# Patient Record
Sex: Male | Born: 1980 | Hispanic: Yes | Smoking: Former smoker
Health system: Southern US, Community
[De-identification: ages and names within clinical notes are randomized; demographics above are authoritative.]

## PROBLEM LIST (undated history)

## (undated) ENCOUNTER — Inpatient Hospital Stay: Payer: Self-pay

## (undated) DIAGNOSIS — M797 Fibromyalgia: Secondary | ICD-10-CM

## (undated) DIAGNOSIS — U099 Post covid-19 condition, unspecified: Secondary | ICD-10-CM

## (undated) DIAGNOSIS — G0490 Encephalitis and encephalomyelitis, unspecified: Secondary | ICD-10-CM

## (undated) DIAGNOSIS — H9120 Sudden idiopathic hearing loss, unspecified ear: Secondary | ICD-10-CM

## (undated) DIAGNOSIS — F32A Depression, unspecified: Secondary | ICD-10-CM

## (undated) DIAGNOSIS — G379 Demyelinating disease of central nervous system, unspecified: Secondary | ICD-10-CM

## (undated) DIAGNOSIS — K5900 Constipation, unspecified: Secondary | ICD-10-CM

## (undated) DIAGNOSIS — J45909 Unspecified asthma, uncomplicated: Secondary | ICD-10-CM

## (undated) DIAGNOSIS — N6019 Diffuse cystic mastopathy of unspecified breast: Secondary | ICD-10-CM

## (undated) DIAGNOSIS — N63 Unspecified lump in unspecified breast: Secondary | ICD-10-CM

## (undated) DIAGNOSIS — G35 Multiple sclerosis: Secondary | ICD-10-CM

## (undated) DIAGNOSIS — G9332 Myalgic encephalomyelitis/chronic fatigue syndrome: Secondary | ICD-10-CM

## (undated) DIAGNOSIS — F329 Major depressive disorder, single episode, unspecified: Secondary | ICD-10-CM

## (undated) DIAGNOSIS — G43909 Migraine, unspecified, not intractable, without status migrainosus: Secondary | ICD-10-CM

## (undated) DIAGNOSIS — N6009 Solitary cyst of unspecified breast: Secondary | ICD-10-CM

## (undated) HISTORY — PX: BUNIONECTOMY: SHX129

## (undated) HISTORY — PX: HERNIA REPAIR: SHX51

## (undated) HISTORY — PX: TOTAL MASTECTOMY: SHX6129

## (undated) HISTORY — DX: Sudden idiopathic hearing loss, unspecified ear: H91.20

## (undated) HISTORY — PX: EYE SURGERY: SHX253

## (undated) HISTORY — DX: Constipation, unspecified: K59.00

---

## 2008-11-18 ENCOUNTER — Emergency Department (HOSPITAL_COMMUNITY): Admission: EM | Admit: 2008-11-18 | Discharge: 2008-11-18 | Payer: Self-pay | Admitting: Emergency Medicine

## 2009-10-20 ENCOUNTER — Emergency Department (HOSPITAL_COMMUNITY): Admission: EM | Admit: 2009-10-20 | Discharge: 2009-10-21 | Payer: Self-pay | Admitting: Emergency Medicine

## 2009-11-30 ENCOUNTER — Encounter: Payer: Self-pay | Admitting: Family Medicine

## 2010-01-02 ENCOUNTER — Ambulatory Visit: Payer: Self-pay | Admitting: Sports Medicine

## 2010-01-02 DIAGNOSIS — M7650 Patellar tendinitis, unspecified knee: Secondary | ICD-10-CM | POA: Insufficient documentation

## 2010-01-02 DIAGNOSIS — M25569 Pain in unspecified knee: Secondary | ICD-10-CM | POA: Insufficient documentation

## 2010-04-10 NOTE — Consult Note (Signed)
Summary: [facility]  [facility]   Imported By: Marily Memos 12/04/2009 13:34:51  _____________________________________________________________________  External Attachment:    Type:   Image     Comment:   External Document

## 2010-04-10 NOTE — Assessment & Plan Note (Signed)
Summary: ORIG 2PM-PAIN KNEES/NP/LP   Vital Signs:  Patient profile:   30 year old male Pulse rate:   96 / minute BP sitting:   134 / 83  (right arm)  Vitals Entered By: Rochele Pages RN (January 02, 2010 1:43 PM) CC: bilat knee pain x 2-3 yrs   History of Present Illness: Dr. Nedra Hai from the [facility] sports medicine clinic is requested a consult  for evaluation of bilateral  knee pain, and requested diagnostic ultrasound evaluation.  The patient has had bilateral knee pain, RIGHT greater than LEFT, ongoing for approximately  2-3 years.  She also has various other intermittent pains in his has been told in the past she does have fibromyalgia syndrome. At this point, she primarily has  pain when rising  from a seated position,, going up and down stairs, and with a  deceleration motion going downwards.  Plain x-rays were obtained at [facility], and these are reportedly normal. Not available for my review.  The patient has been seen by multiple physicians by this in the past. She was told in past that she may have patellar malalignment, and what sounds to be some patellofemoral symptoms. Never has been found to have any internal arrangement. She has never had any history of intra-articular operative intervention.  No prior fractures and around the joints.  right now she is attempting to do aquatic rehabilitation.  Past History:  Past Medical History: Depression Borderline Personality d/o ? Fibromyalgia  Social History: Equities trader, school system, sign  Review of Systems       REVIEW OF SYSTEMS REVIEW OF SYSTEMS  GEN: No systemic complaints, no fevers, chills, sweats, or other acute illnesses MSK: Detailed in the HPI GI: tolerating PO intake without difficulty Neuro: No numbness, parasthesias, or tingling associated. Otherwise the pertinent positives of the ROS are noted above.    Physical Exam  General:  GEN: Well-developed,well-nourished,in no acute  distress; alert,appropriate and cooperative throughout examination HEENT: Normocephalic and atraumatic without obvious abnormalities. No apparent alopecia or balding. Ears, externally no deformities PULM: Breathing comfortably in no respiratory distress EXT: No clubbing, cyanosis, or edema PSYCH: Normally interactive. Cooperative during the interview. Pleasant. Friendly and conversant. Not anxious or depressed appearing. Normal, full affect.  Msk:  bilaterally his: Full range of motion. Full extension. Flexion to be on 125.  No tenderness to palpation with medial and lateral patellar facet loading.  Patellar tendon is notably tender on the RIGHT compared to the LEFT.  Quadriceps tendon superior to  the patella  is quite tender bilaterally.  No effusion.  Stable MCL, LCL. Negative Lachman. Negative anterior and posterior drawer examinations.  Negative McMurray's, negative flexion pinch test. Negative bounce home test.  Nontender along the joint lines, and nontender at the pes anserinus area.  Strength testing is 5/5.  neurovascularly intact. Additional Exam:   Diagnostic Ultrasound Evaluation [employer] E, MSK ultrasound, MSK probe Anatomy scanned: bilateral knees Indication: Pain Findings: there is some slight degree of greater color-flow Doppler activity around the patellar tendons, RIGHT greater than LEFT. Additionally so  on the quadriceps tendons.  Bilateral joint lines appear clear with no opacities in the joint line or osteophyte significant formation. Patellar and quadriceps tendons appear intact. No significant effusions. MCL and LCL bilaterally appear intact as well.   Impression & Recommendations:  Problem # 1:  KNEE PAIN, BILATERAL (ICD-719.46) bilateral quadriceps tendinitis with RIGHT greater than LEFT patellar tendinitis.  Recommendations include: Weight loss at all costs.  Reviewed rehabilitation protocol emphasizing eccentric quadricep strengthening  and lowers, particularly on a  inclined  if at all possible. Continued water aerobics. We will do a trial of nitroglycerin patches,, using a lower dose  given multiple  tendinopathy is present.  Initially start at the RIGHT patellar tendon, then progressed to both quadriceps tendons, adding over  2 weeks.  Additionally, we added but he looks  patellar tendon straps bilaterally  to be used when the patient is active.  cc: Dr. Nedra Hai  Orders: Garment,belt,sleeve or other covering ,elastic or similar stretch (Z6109)  Problem # 2:  TENDINITIS, PATELLAR (ICD-726.64)  Complete Medication List: 1)  Nitroglycerin 0.1 Mg/hr Pt24 (Nitroglycerin) .... 1/4 patch to right patellar and bilateral quadriceps tendons, change daily  Patient Instructions: 1)  f/u 6 weeks 2)  ICE YOUR KNEES WHEN SORE 3)  DO THE REHAB WE REVIEWED Prescriptions: NITROGLYCERIN 0.1 MG/HR PT24 (NITROGLYCERIN) 1/4 patch to right patellar and bilateral quadriceps tendons, change daily  #24 x 3   Entered and Authorized by:   Hannah Beat MD   Signed by:   Hannah Beat MD on 01/02/2010   Method used:   Print then Give to Patient   RxID:   [id number]    Orders Added: 1)  Garment,belt,sleeve or other covering ,elastic or similar stretch [A4466] 2)  Consultation Level III [95621]

## 2010-05-25 LAB — GLUCOSE, CAPILLARY: Glucose-Capillary: 101 mg/dL — ABNORMAL HIGH (ref 70–99)

## 2010-05-25 LAB — URINE MICROSCOPIC-ADD ON

## 2010-05-25 LAB — URINALYSIS, ROUTINE W REFLEX MICROSCOPIC
Bilirubin Urine: NEGATIVE
Glucose, UA: NEGATIVE mg/dL
Specific Gravity, Urine: 1.024 (ref 1.005–1.030)
pH: 5.5 (ref 5.0–8.0)

## 2010-05-25 LAB — POCT PREGNANCY, URINE: Preg Test, Ur: NEGATIVE

## 2010-06-15 LAB — BASIC METABOLIC PANEL
BUN: 4 mg/dL — ABNORMAL LOW (ref 6–23)
Calcium: 9 mg/dL (ref 8.4–10.5)
Creatinine, Ser: 0.93 mg/dL (ref 0.4–1.2)
GFR calc non Af Amer: >60 mL/min (ref >60)
Glucose, Bld: 104 mg/dL — ABNORMAL HIGH (ref 70–99)
Potassium: 3.5 mEq/L (ref 3.5–5.1)

## 2010-06-15 LAB — DIFFERENTIAL
Basophils Absolute: 0 10*3/uL (ref 0.0–0.1)
Lymphs Abs: 0.9 10*3/uL (ref 0.7–4.0)
Monocytes Relative: 26 % — ABNORMAL HIGH (ref 3–12)
Neutrophils Relative %: 52 % (ref 43–77)

## 2010-06-15 LAB — POCT CARDIAC MARKERS
CKMB, poc: <1 ng/mL — ABNORMAL LOW (ref 1.0–8.0)
Myoglobin, poc: 40.3 ng/mL (ref 12–200)
Myoglobin, poc: 57.9 ng/mL (ref 12–200)

## 2010-06-15 LAB — CBC
HCT: 38.8 % (ref 36.0–46.0)
Platelets: 247 10*3/uL (ref 150–400)
RDW: 12.9 % (ref 11.5–15.5)
WBC: 4.3 10*3/uL (ref 4.0–10.5)

## 2010-06-15 LAB — PREGNANCY, URINE: Preg Test, Ur: NEGATIVE

## 2010-06-15 LAB — D-DIMER, QUANTITATIVE: D-Dimer, Quant: 0.48 ug/mL-FEU (ref 0.00–0.48)

## 2010-07-31 ENCOUNTER — Emergency Department (HOSPITAL_COMMUNITY)
Admission: EM | Admit: 2010-07-31 | Discharge: 2010-07-31 | Disposition: A | Discharge status: Home / Self Care | Payer: Self-pay | Attending: Emergency Medicine | Admitting: Emergency Medicine

## 2010-07-31 DIAGNOSIS — J45909 Unspecified asthma, uncomplicated: Secondary | ICD-10-CM | POA: Insufficient documentation

## 2010-07-31 DIAGNOSIS — R6889 Other general symptoms and signs: Secondary | ICD-10-CM | POA: Insufficient documentation

## 2010-07-31 DIAGNOSIS — R0602 Shortness of breath: Secondary | ICD-10-CM | POA: Insufficient documentation

## 2010-07-31 DIAGNOSIS — F329 Major depressive disorder, single episode, unspecified: Secondary | ICD-10-CM | POA: Insufficient documentation

## 2010-07-31 DIAGNOSIS — F411 Generalized anxiety disorder: Secondary | ICD-10-CM | POA: Insufficient documentation

## 2010-07-31 DIAGNOSIS — T63461A Toxic effect of venom of wasps, accidental (unintentional), initial encounter: Secondary | ICD-10-CM | POA: Insufficient documentation

## 2010-07-31 DIAGNOSIS — F3289 Other specified depressive episodes: Secondary | ICD-10-CM | POA: Insufficient documentation

## 2010-07-31 DIAGNOSIS — F988 Other specified behavioral and emotional disorders with onset usually occurring in childhood and adolescence: Secondary | ICD-10-CM | POA: Insufficient documentation

## 2010-07-31 DIAGNOSIS — T6391XA Toxic effect of contact with unspecified venomous animal, accidental (unintentional), initial encounter: Secondary | ICD-10-CM | POA: Insufficient documentation

## 2010-10-10 ENCOUNTER — Emergency Department (HOSPITAL_COMMUNITY): Payer: BC Managed Care – PPO

## 2010-10-10 ENCOUNTER — Emergency Department (HOSPITAL_COMMUNITY)
Admission: EM | Admit: 2010-10-10 | Discharge: 2010-10-11 | Disposition: A | Discharge status: Home / Self Care | Payer: BC Managed Care – PPO | Attending: Emergency Medicine | Admitting: Emergency Medicine

## 2010-10-10 DIAGNOSIS — M79609 Pain in unspecified limb: Secondary | ICD-10-CM | POA: Insufficient documentation

## 2010-10-10 DIAGNOSIS — M21619 Bunion of unspecified foot: Secondary | ICD-10-CM | POA: Insufficient documentation

## 2010-12-14 ENCOUNTER — Ambulatory Visit: Payer: Self-pay | Admitting: Podiatry

## 2010-12-21 ENCOUNTER — Ambulatory Visit: Payer: Self-pay | Admitting: Podiatry

## 2011-01-16 ENCOUNTER — Emergency Department: Payer: Self-pay

## 2011-05-14 ENCOUNTER — Ambulatory Visit: Payer: Self-pay | Admitting: Internal Medicine

## 2011-09-17 ENCOUNTER — Emergency Department: Payer: Self-pay | Admitting: *Deleted

## 2011-09-22 ENCOUNTER — Emergency Department: Payer: Self-pay | Admitting: *Deleted

## 2012-03-07 ENCOUNTER — Emergency Department: Payer: Self-pay | Admitting: Emergency Medicine

## 2012-03-24 ENCOUNTER — Emergency Department: Payer: Self-pay | Admitting: Emergency Medicine

## 2013-03-11 HISTORY — PX: LAPAROSCOPIC GASTRIC SLEEVE RESECTION: SHX5895

## 2013-03-15 ENCOUNTER — Ambulatory Visit: Payer: Self-pay | Admitting: Podiatry

## 2013-03-17 ENCOUNTER — Ambulatory Visit: Payer: Self-pay | Admitting: Podiatry

## 2013-04-13 ENCOUNTER — Emergency Department: Payer: Self-pay | Admitting: Emergency Medicine

## 2013-05-04 ENCOUNTER — Emergency Department: Payer: Self-pay | Admitting: Emergency Medicine

## 2013-05-04 LAB — CBC WITH DIFFERENTIAL/PLATELET
BASOS PCT: 0.4 %
Basophil #: 0 10*3/uL (ref 0.0–0.1)
EOS PCT: 0.1 %
Eosinophil #: 0 10*3/uL (ref 0.0–0.7)
HCT: 45.3 % (ref 35.0–47.0)
HGB: 14.9 g/dL (ref 12.0–16.0)
Lymphocyte #: 0.3 10*3/uL — ABNORMAL LOW (ref 1.0–3.6)
Lymphocyte %: 3.3 %
MCH: 27.7 pg (ref 26.0–34.0)
MCHC: 32.8 g/dL (ref 32.0–36.0)
MCV: 84 fL (ref 80–100)
MONO ABS: 0.3 x10 3/mm (ref 0.2–0.9)
MONOS PCT: 3.5 %
NEUTROS ABS: 8.5 10*3/uL — AB (ref 1.4–6.5)
Neutrophil %: 92.7 %
Platelet: 328 10*3/uL (ref 150–440)
RBC: 5.37 10*6/uL — AB (ref 3.80–5.20)
RDW: 13.6 % (ref 11.5–14.5)
WBC: 9.1 10*3/uL (ref 3.6–11.0)

## 2013-05-04 LAB — COMPREHENSIVE METABOLIC PANEL
Albumin: 3.8 g/dL (ref 3.4–5.0)
Alkaline Phosphatase: 126 U/L — ABNORMAL HIGH
Anion Gap: 5 — ABNORMAL LOW (ref 7–16)
BUN: 8 mg/dL (ref 7–18)
Bilirubin,Total: 0.5 mg/dL (ref 0.2–1.0)
CO2: 27 mmol/L (ref 21–32)
Calcium, Total: 8.9 mg/dL (ref 8.5–10.1)
Chloride: 103 mmol/L (ref 98–107)
Creatinine: 0.87 mg/dL (ref 0.60–1.30)
EGFR (African American): >60
Glucose: 113 mg/dL — ABNORMAL HIGH (ref 65–99)
Osmolality: 269 (ref 275–301)
POTASSIUM: 3.8 mmol/L (ref 3.5–5.1)
SGOT(AST): 20 U/L (ref 15–37)
SGPT (ALT): 22 U/L (ref 12–78)
Sodium: 135 mmol/L — ABNORMAL LOW (ref 136–145)
TOTAL PROTEIN: 8.6 g/dL — AB (ref 6.4–8.2)

## 2013-05-04 LAB — URINALYSIS, COMPLETE
BACTERIA: NONE SEEN
Bilirubin,UR: NEGATIVE
Glucose,UR: NEGATIVE mg/dL (ref 0–75)
LEUKOCYTE ESTERASE: NEGATIVE
NITRITE: NEGATIVE
PH: 6 (ref 4.5–8.0)
Protein: NEGATIVE
SPECIFIC GRAVITY: 1.021 (ref 1.003–1.030)
Squamous Epithelial: 12
WBC UR: 2 /HPF (ref 0–5)

## 2013-05-04 LAB — TSH: THYROID STIMULATING HORM: 0.581 u[IU]/mL

## 2013-05-04 LAB — T4, FREE: Free Thyroxine: 1.07 ng/dL (ref 0.76–1.46)

## 2013-07-26 DIAGNOSIS — R51 Headache: Secondary | ICD-10-CM

## 2013-07-26 DIAGNOSIS — R55 Syncope and collapse: Secondary | ICD-10-CM | POA: Insufficient documentation

## 2013-07-26 DIAGNOSIS — R519 Headache, unspecified: Secondary | ICD-10-CM | POA: Insufficient documentation

## 2013-08-06 ENCOUNTER — Ambulatory Visit: Payer: Self-pay | Admitting: Physician Assistant

## 2013-10-06 ENCOUNTER — Ambulatory Visit: Payer: Self-pay | Admitting: Specialist

## 2014-01-08 ENCOUNTER — Emergency Department: Payer: Self-pay | Admitting: Emergency Medicine

## 2014-01-08 LAB — CBC
HCT: 43.3 % (ref 35.0–47.0)
HGB: 14.3 g/dL (ref 12.0–16.0)
MCH: 28.5 pg (ref 26.0–34.0)
MCHC: 32.9 g/dL (ref 32.0–36.0)
MCV: 87 fL (ref 80–100)
PLATELETS: 334 10*3/uL (ref 150–440)
RBC: 5 10*6/uL (ref 3.80–5.20)
RDW: 13.5 % (ref 11.5–14.5)
WBC: 3.9 10*3/uL (ref 3.6–11.0)

## 2014-01-08 LAB — COMPREHENSIVE METABOLIC PANEL
ALBUMIN: 3.5 g/dL (ref 3.4–5.0)
ALT: 20 U/L
Alkaline Phosphatase: 102 U/L
Anion Gap: 6 — ABNORMAL LOW (ref 7–16)
BUN: 7 mg/dL (ref 7–18)
Bilirubin,Total: 0.3 mg/dL (ref 0.2–1.0)
CALCIUM: 8.7 mg/dL (ref 8.5–10.1)
CHLORIDE: 104 mmol/L (ref 98–107)
Co2: 30 mmol/L (ref 21–32)
Creatinine: 0.85 mg/dL (ref 0.60–1.30)
EGFR (Non-African Amer.): >60
GLUCOSE: 93 mg/dL (ref 65–99)
Osmolality: 277 (ref 275–301)
POTASSIUM: 4.2 mmol/L (ref 3.5–5.1)
SGOT(AST): 16 U/L (ref 15–37)
SODIUM: 140 mmol/L (ref 136–145)
TOTAL PROTEIN: 8 g/dL (ref 6.4–8.2)

## 2014-01-08 LAB — URINALYSIS, COMPLETE
BACTERIA: NONE SEEN
Bilirubin,UR: NEGATIVE
GLUCOSE, UR: NEGATIVE mg/dL (ref 0–75)
Ketone: NEGATIVE
LEUKOCYTE ESTERASE: NEGATIVE
Nitrite: NEGATIVE
Ph: 5 (ref 4.5–8.0)
Protein: NEGATIVE
SPECIFIC GRAVITY: 1.018 (ref 1.003–1.030)
Squamous Epithelial: 2

## 2014-01-08 LAB — DIFFERENTIAL
Basophil #: 0 10*3/uL (ref 0.0–0.1)
Basophil %: 0.3 %
Eosinophil #: 0 10*3/uL (ref 0.0–0.7)
Eosinophil %: 1.2 %
Lymphocyte #: 1.4 10*3/uL (ref 1.0–3.6)
Lymphocyte %: 35.3 %
Monocyte #: 0.8 x10 3/mm (ref 0.2–0.9)
Monocyte %: 20.1 %
Neutrophil #: 1.7 10*3/uL (ref 1.4–6.5)
Neutrophil %: 43.1 %

## 2014-01-08 LAB — TROPONIN I: Troponin-I: <0.02 ng/mL

## 2014-01-08 LAB — PROTIME-INR
INR: 1.1
PROTHROMBIN TIME: 13.9 s (ref 11.5–14.7)

## 2014-01-25 ENCOUNTER — Emergency Department: Payer: Self-pay | Admitting: Emergency Medicine

## 2014-01-26 LAB — BASIC METABOLIC PANEL
Anion Gap: 6 — ABNORMAL LOW (ref 7–16)
BUN: 11 mg/dL (ref 7–18)
CALCIUM: 8.2 mg/dL — AB (ref 8.5–10.1)
Chloride: 104 mmol/L (ref 98–107)
Co2: 29 mmol/L (ref 21–32)
Creatinine: 0.9 mg/dL (ref 0.60–1.30)
Glucose: 108 mg/dL — ABNORMAL HIGH (ref 65–99)
OSMOLALITY: 277 (ref 275–301)
POTASSIUM: 4 mmol/L (ref 3.5–5.1)
Sodium: 139 mmol/L (ref 136–145)

## 2014-05-22 ENCOUNTER — Emergency Department: Payer: Self-pay | Admitting: Emergency Medicine

## 2014-09-19 ENCOUNTER — Other Ambulatory Visit: Payer: Self-pay | Admitting: Physician Assistant

## 2014-09-19 DIAGNOSIS — R222 Localized swelling, mass and lump, trunk: Secondary | ICD-10-CM

## 2014-09-20 ENCOUNTER — Other Ambulatory Visit: Payer: Self-pay | Admitting: Physician Assistant

## 2014-09-20 DIAGNOSIS — R222 Localized swelling, mass and lump, trunk: Secondary | ICD-10-CM

## 2014-09-21 ENCOUNTER — Other Ambulatory Visit: Payer: BC Managed Care – PPO

## 2014-09-21 ENCOUNTER — Ambulatory Visit
Admission: RE | Admit: 2014-09-21 | Discharge: 2014-09-21 | Disposition: A | Discharge status: Home / Self Care | Payer: BC Managed Care – PPO | Source: Ambulatory Visit | Attending: Physician Assistant | Admitting: Physician Assistant

## 2014-09-21 ENCOUNTER — Ambulatory Visit: Payer: BC Managed Care – PPO

## 2014-09-21 DIAGNOSIS — R222 Localized swelling, mass and lump, trunk: Secondary | ICD-10-CM

## 2015-02-04 DIAGNOSIS — S29011A Strain of muscle and tendon of front wall of thorax, initial encounter: Secondary | ICD-10-CM | POA: Diagnosis not present

## 2015-02-04 DIAGNOSIS — Y92410 Unspecified street and highway as the place of occurrence of the external cause: Secondary | ICD-10-CM | POA: Diagnosis not present

## 2015-02-04 DIAGNOSIS — S79922A Unspecified injury of left thigh, initial encounter: Secondary | ICD-10-CM | POA: Insufficient documentation

## 2015-02-04 DIAGNOSIS — S199XXA Unspecified injury of neck, initial encounter: Secondary | ICD-10-CM | POA: Diagnosis present

## 2015-02-04 DIAGNOSIS — Z880 Allergy status to penicillin: Secondary | ICD-10-CM | POA: Insufficient documentation

## 2015-02-04 DIAGNOSIS — Y9389 Activity, other specified: Secondary | ICD-10-CM | POA: Diagnosis not present

## 2015-02-04 DIAGNOSIS — S161XXA Strain of muscle, fascia and tendon at neck level, initial encounter: Secondary | ICD-10-CM | POA: Insufficient documentation

## 2015-02-04 DIAGNOSIS — Y998 Other external cause status: Secondary | ICD-10-CM | POA: Diagnosis not present
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
## 2015-02-05 ENCOUNTER — Emergency Department
Admission: EM | Admit: 2015-02-05 | Discharge: 2015-02-05 | Disposition: A | Discharge status: Home / Self Care | Payer: BC Managed Care – PPO | Attending: Emergency Medicine | Admitting: Emergency Medicine

## 2015-02-05 ENCOUNTER — Emergency Department: Payer: BC Managed Care – PPO

## 2015-02-05 ENCOUNTER — Encounter: Payer: Self-pay | Admitting: Emergency Medicine

## 2015-02-05 DIAGNOSIS — S161XXA Strain of muscle, fascia and tendon at neck level, initial encounter: Secondary | ICD-10-CM

## 2015-02-05 DIAGNOSIS — M79652 Pain in left thigh: Secondary | ICD-10-CM

## 2015-02-05 DIAGNOSIS — S29019A Strain of muscle and tendon of unspecified wall of thorax, initial encounter: Secondary | ICD-10-CM

## 2015-02-05 MED ORDER — DIAZEPAM 2 MG PO TABS
2.0000 mg | ORAL_TABLET | Freq: Once | ORAL | Status: AC
  Administered 2015-02-05: 2 mg via ORAL
  Filled 2015-02-05: qty 1

## 2015-02-05 MED ORDER — OXYCODONE-ACETAMINOPHEN 5-325 MG PO TABS
1.0000 | ORAL_TABLET | Freq: Once | ORAL | Status: AC
  Administered 2015-02-05: 1 via ORAL
  Filled 2015-02-05: qty 1

## 2015-02-05 MED ORDER — KETOROLAC TROMETHAMINE 30 MG/ML IJ SOLN
60.0000 mg | Freq: Once | INTRAMUSCULAR | Status: AC
  Administered 2015-02-05: 60 mg via INTRAMUSCULAR
  Filled 2015-02-05: qty 2

## 2015-02-05 MED ORDER — OXYCODONE-ACETAMINOPHEN 5-325 MG PO TABS: 1.0000 | ORAL_TABLET | ORAL | Status: DC | PRN

## 2015-02-05 MED ORDER — DIAZEPAM 2 MG PO TABS: 2.0000 mg | ORAL_TABLET | Freq: Three times a day (TID) | ORAL | Status: DC | PRN

## 2015-02-05 NOTE — ED Notes (Signed)
Patient with no complaints at this time. Respirations even and unlabored. Skin warm/dry. Discharge instructions reviewed with patient at this time. Patient given opportunity to voice concerns/ask questions. Patient discharged at this time and left Emergency Department with steady gait.   
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
## 2015-03-15 ENCOUNTER — Encounter: Payer: Self-pay | Admitting: Emergency Medicine

## 2015-03-15 ENCOUNTER — Emergency Department
Admission: EM | Admit: 2015-03-15 | Discharge: 2015-03-16 | Disposition: A | Discharge status: Home / Self Care | Payer: BC Managed Care – PPO | Attending: Emergency Medicine | Admitting: Emergency Medicine

## 2015-03-15 DIAGNOSIS — G43011 Migraine without aura, intractable, with status migrainosus: Secondary | ICD-10-CM | POA: Diagnosis not present

## 2015-03-15 DIAGNOSIS — Z9884 Bariatric surgery status: Secondary | ICD-10-CM | POA: Diagnosis not present

## 2015-03-15 DIAGNOSIS — R197 Diarrhea, unspecified: Secondary | ICD-10-CM | POA: Diagnosis not present

## 2015-03-15 DIAGNOSIS — R109 Unspecified abdominal pain: Secondary | ICD-10-CM | POA: Diagnosis not present

## 2015-03-15 DIAGNOSIS — Z880 Allergy status to penicillin: Secondary | ICD-10-CM | POA: Diagnosis not present

## 2015-03-15 DIAGNOSIS — R079 Chest pain, unspecified: Secondary | ICD-10-CM | POA: Diagnosis not present

## 2015-03-15 DIAGNOSIS — G43911 Migraine, unspecified, intractable, with status migrainosus: Secondary | ICD-10-CM

## 2015-03-15 DIAGNOSIS — R51 Headache: Secondary | ICD-10-CM | POA: Diagnosis present

## 2015-03-15 DIAGNOSIS — R110 Nausea: Secondary | ICD-10-CM

## 2015-03-15 HISTORY — DX: Migraine, unspecified, not intractable, without status migrainosus: G43.909

## 2015-03-15 HISTORY — DX: Unspecified asthma, uncomplicated: J45.909

## 2015-03-15 HISTORY — DX: Depression, unspecified: F32.A

## 2015-03-15 HISTORY — DX: Fibromyalgia: M79.7

## 2015-03-15 HISTORY — DX: Major depressive disorder, single episode, unspecified: F32.9

## 2015-03-15 LAB — COMPREHENSIVE METABOLIC PANEL
ALT: 18 U/L (ref 14–54)
AST: 17 U/L (ref 15–41)
Albumin: 4.4 g/dL (ref 3.5–5.0)
Alkaline Phosphatase: 59 U/L (ref 38–126)
Anion gap: 5 (ref 5–15)
BUN: 9 mg/dL (ref 6–20)
CALCIUM: 9.6 mg/dL (ref 8.9–10.3)
CHLORIDE: 105 mmol/L (ref 101–111)
CO2: 27 mmol/L (ref 22–32)
CREATININE: 0.65 mg/dL (ref 0.44–1.00)
GFR calc non Af Amer: >60 mL/min (ref >60)
GLUCOSE: 102 mg/dL — AB (ref 65–99)
Potassium: 4 mmol/L (ref 3.5–5.1)
SODIUM: 137 mmol/L (ref 135–145)
Total Bilirubin: 0.6 mg/dL (ref 0.3–1.2)
Total Protein: 7.8 g/dL (ref 6.5–8.1)

## 2015-03-15 LAB — URINALYSIS COMPLETE WITH MICROSCOPIC (ARMC ONLY)
BILIRUBIN URINE: NEGATIVE
Bacteria, UA: NONE SEEN
GLUCOSE, UA: NEGATIVE mg/dL
Hgb urine dipstick: NEGATIVE
KETONES UR: NEGATIVE mg/dL
Leukocytes, UA: NEGATIVE
Nitrite: NEGATIVE
PH: 6 (ref 5.0–8.0)
Protein, ur: NEGATIVE mg/dL
RBC / HPF: NONE SEEN RBC/hpf (ref 0–5)
Specific Gravity, Urine: 1.001 — ABNORMAL LOW (ref 1.005–1.030)
WBC, UA: NONE SEEN WBC/hpf (ref 0–5)

## 2015-03-15 LAB — CBC
HCT: 40.8 % (ref 35.0–47.0)
Hemoglobin: 13.8 g/dL (ref 12.0–16.0)
MCH: 29.5 pg (ref 26.0–34.0)
MCHC: 33.8 g/dL (ref 32.0–36.0)
MCV: 87.2 fL (ref 80.0–100.0)
PLATELETS: 295 10*3/uL (ref 150–440)
RBC: 4.68 MIL/uL (ref 3.80–5.20)
RDW: 13.4 % (ref 11.5–14.5)
WBC: 5.1 10*3/uL (ref 3.6–11.0)

## 2015-03-15 LAB — LIPASE, BLOOD: LIPASE: 21 U/L (ref 11–51)

## 2015-03-15 MED ORDER — METOCLOPRAMIDE HCL 5 MG/ML IJ SOLN
INTRAMUSCULAR | Status: AC
  Administered 2015-03-15: 10 mg via INTRAVENOUS
  Filled 2015-03-15: qty 2

## 2015-03-15 MED ORDER — METOCLOPRAMIDE HCL 5 MG/ML IJ SOLN
10.0000 mg | Freq: Once | INTRAMUSCULAR | Status: AC
  Administered 2015-03-15: 10 mg via INTRAVENOUS

## 2015-03-15 MED ORDER — SODIUM CHLORIDE 0.9 % IV BOLUS (SEPSIS)
1000.0000 mL | Freq: Once | INTRAVENOUS | Status: AC
  Administered 2015-03-15: 1000 mL via INTRAVENOUS

## 2015-03-15 MED ORDER — MAGNESIUM SULFATE 2 GM/50ML IV SOLN
INTRAVENOUS | Status: AC
  Filled 2015-03-15: qty 50

## 2015-03-15 MED ORDER — DIPHENHYDRAMINE HCL 50 MG/ML IJ SOLN
INTRAMUSCULAR | Status: AC
  Administered 2015-03-15: 12.5 mg
  Filled 2015-03-15: qty 1

## 2015-03-15 MED ORDER — METRONIDAZOLE 500 MG PO TABS: 500.0000 mg | ORAL_TABLET | Freq: Two times a day (BID) | ORAL | Status: DC

## 2015-03-15 MED ORDER — MAGNESIUM SULFATE 2 GM/50ML IV SOLN
2.0000 g | Freq: Once | INTRAVENOUS | Status: AC
  Administered 2015-03-15: 2 g via INTRAVENOUS

## 2015-03-15 MED ORDER — ONDANSETRON 4 MG PO TBDP
ORAL_TABLET | ORAL | Status: AC
  Filled 2015-03-15: qty 1

## 2015-03-15 MED ORDER — ONDANSETRON 4 MG PO TBDP
4.0000 mg | ORAL_TABLET | Freq: Once | ORAL | Status: AC
  Administered 2015-03-15: 4 mg via ORAL

## 2015-03-15 NOTE — ED Notes (Addendum)
Pt reports migraine last night, chestpain, vomiting diarrhea started today. Pt also reports epigastric pain. Pt reports taking tylenol and other medication with little relief. Pt reports pain to left ear.

## 2015-03-15 NOTE — Discharge Instructions (Signed)
Migraine Headache  A migraine headache is very bad, throbbing pain on one or both sides of your head. Talk to your doctor about what things may bring on (trigger) your migraine headaches.  HOME CARE  · Only take medicines as told by your doctor.  · Lie down in a dark, quiet room when you have a migraine.  · Keep a journal to find out if certain things bring on migraine headaches. For example, write down:    What you eat and drink.    How much sleep you get.    Any change to your diet or medicines.  · Lessen how much alcohol you drink.  · Quit smoking if you smoke.  · Get enough sleep.  · Lessen any stress in your life.  · Keep lights dim if bright lights bother you or make your migraines worse.  GET HELP RIGHT AWAY IF:   · Your migraine becomes really bad.  · You have a fever.  · You have a stiff neck.  · You have trouble seeing.  · Your muscles are weak, or you lose muscle control.  · You lose your balance or have trouble walking.  · You feel like you will pass out (faint), or you pass out.  · You have really bad symptoms that are different than your first symptoms.  MAKE SURE YOU:   · Understand these instructions.  · Will watch your condition.  · Will get help right away if you are not doing well or get worse.     This information is not intended to replace advice given to you by your health care provider. Make sure you discuss any questions you have with your health care provider.     Document Released: 12/05/2007 Document Revised: 05/20/2011 Document Reviewed: 11/02/2012  Elsevier Interactive Patient Education ©2016 Elsevier Inc.

## 2015-03-15 NOTE — ED Notes (Signed)
Pt states that spouse has C Diff, and pt is also complaining of diarrhea.  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  Pt reports n/v/d today.  Feels weak and dehydrated.

## 2015-03-15 NOTE — ED Provider Notes (Signed)
[facility] Emergency Department Provider Note  ____________________________________________  Time seen: 1040  I have reviewed the triage vital signs and the nursing notes.  History by:  Patient  HISTORY  Chief Complaint Chest Pain; Emesis; Diarrhea; and Headache     HPI [name] is a 35 y.o. male who developed a headache last night. She reports it is similar to her usual migraine. She began to have diarrhea today. This is continues today, though she reports she has not had a bowel movement since 3 PM. Patient has also had some nausea. 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 She does complain of epigastric discomfort, similar to what you get when you vomit, according to the patient.  The patient's partner was just in the hospital to have a gastric sleeve placed also. This was this past Friday. She is developed diarrhea and her physicians are concerned that she may have C. difficile. She has an appointment with her doctor tomorrow morning. This leads [place] to have an increased level of concern for C. difficile for our patient here now.   Past Medical History  Diagnosis Date  . Migraines   . Asthma   . Fibromyalgia   . Depression     Patient Active Problem List   Diagnosis Date Noted  . KNEE PAIN, BILATERAL 01/02/2010  . TENDINITIS, PATELLAR 01/02/2010    Past Surgical History  Procedure Laterality Date  . Bunionectomy Bilateral   . Hernia repair  1991, 2001    inguinal and umbilical  . Eye surgery      blood clot in eye  . Laparoscopic gastric sleeve resection  2015    Current Outpatient Rx  Name  Route  Sig  Dispense  Refill  . diazepam (VALIUM) 2 MG tablet   Oral   Take 1 tablet (2 mg total) by mouth every 8 (eight) hours as needed for muscle spasms.   15 tablet   0   . metroNIDAZOLE (FLAGYL) 500 MG tablet   Oral   Take 1 tablet (500 mg total) by mouth 2 (two) times daily.    14 tablet   0   . oxyCODONE-acetaminophen (ROXICET) 5-325 MG tablet   Oral   Take 1 tablet by mouth every 4 (four) hours as needed for severe pain.   15 tablet   0     Allergies Penicillins; Dexamethasone; Doxycycline; and Hydrocodone-acetaminophen  Family History  Problem Relation Age of Onset  . Breast cancer Paternal Aunt   . Breast cancer Paternal Aunt     Social History Social History  Substance Use Topics  . Smoking status: Never Smoker   . Smokeless tobacco: None  . Alcohol Use: No    Review of Systems  Constitutional: Negative for fever/chills. ENT: Negative for congestion. Cardiovascular: Negative for chest pain. Respiratory: Negative for cough. Gastrointestinal: Positive for abdominal pain with diarrhea. Positive for nausea. Patient with gastric sleeve. See history of present illness Genitourinary: Negative for dysuria. Musculoskeletal: No back pain. Skin: Negative for rash. Neurological: Positive for headache. Similar to prior migraines. See history of present illness  10-point ROS otherwise negative.  ____________________________________________   PHYSICAL EXAM:  VITAL SIGNS: ED Triage Vitals  Enc Vitals Group     BP 03/15/15 1808 109/48 mmHg     Pulse Rate 03/15/15 1808 59     Resp 03/15/15 1808 16     Temp 03/15/15 1808 97.6 F (  36.4 C)     Temp Source 03/15/15 1808 Oral     SpO2 03/15/15 2148 100 %     Weight 03/15/15 1800 165 lb (74.844 kg)     Height 03/15/15 1800 5\' 10"  (1.778 m)     Head Cir --      Peak Flow --      Pain Score 03/15/15 1800 8     Pain Loc --      Pain Edu? --      Excl. in [place]? --     Constitutional: Alert and oriented. Patient prefers dark room. She is a little bit quiet and subdued, consistent with discomfort from a migraine. Otherwise no distress. ENT   Head: Normocephalic and atraumatic. Cardiovascular: Normal rate, regular rhythm, no murmur noted Respiratory:  Normal respiratory effort, no tachypnea.     Breath sounds are clear and equal bilaterally.  Gastrointestinal: Soft, no distention. Nontender, including in the epigastric area where she has discomfort. Back: No muscle spasm, no tenderness, no CVA tenderness. Musculoskeletal: No deformity noted. Nontender with normal range of motion in all extremities.  No noted edema. Neurologic:  Communicative. Normal appearing spontaneous movement in all 4 extremities. No gross focal neurologic deficits are appreciated.  Skin:  Skin is warm, dry. No rash noted. Psychiatric: Mood and affect are normal. Speech and behavior are normal.  ____________________________________________    LABS (pertinent positives/negatives)  Labs Reviewed  COMPREHENSIVE METABOLIC PANEL - Abnormal; Notable for the following:    Glucose, Bld 102 (*)    All other components within normal limits  URINALYSIS COMPLETEWITH MICROSCOPIC ([facility] ONLY) - Abnormal; Notable for the following:    Color, Urine COLORLESS (*)    APPearance CLEAR (*)    Specific Gravity, Urine 1.001 (*)    Squamous Epithelial / LPF 0-5 (*)    All other components within normal limits  LIPASE, BLOOD  CBC  POC URINE PREG, ED     ____________________________________________  ECG:  ED ECG REPORT I, Hason Ofarrell W, the attending physician, personally viewed and interpreted this ECG.   Date: 03/15/2015  EKG Time: 1814  Rate: 65  Rhythm:  Normal sinus rhythm  Axis: Normal  Intervals: Normal  ST&T Change: None noted   ____________________________________________   INITIAL IMPRESSION / ASSESSMENT AND PLAN / ED COURSE  Pertinent labs & imaging results that were available during my care of the patient were reviewed by me and considered in my medical decision making (see chart for details).  35 year old male with migraine, nausea, abdominal pain, and diarrhea.  We will treat the patient with Reglan for the nausea and for the migraine. I will add magnesium for the migraine as well.  She'll receive 1 L of normal saline to replenish her fluids. If she can provide a stool sample, we will check for C. difficile area and I discussed the option of using Flagyl at this time even without testing. The patient would like to do this.  I will last one of my colleagues to reassess the patient after she has been treated. If she is feeling better, she will certainly be able to be discharged home with follow-up with her primary physician.  ____________________________________________   FINAL CLINICAL IMPRESSION(S) / ED DIAGNOSES  Final diagnoses:  Intractable migraine with status migrainosus, unspecified migraine type  Diarrhea, unspecified type  Nausea in adult       Ahmed Prima, MD 03/15/15 2253

## 2015-03-16 NOTE — ED Provider Notes (Signed)
-----------------------------------------   12:29 AM on 03/16/2015 -----------------------------------------  Patient was feeling much better after headache cocktail. She did not provide bowel movement to send for C. difficile. Strict return precautions given. Patient verbalizes understanding and agrees with plan of care.  Paulette Blanch, MD 03/16/15 [phone]

## 2015-03-16 NOTE — ED Notes (Signed)
Patient with no complaints at this time. Respirations even and unlabored. Skin warm/dry. Discharge instructions reviewed with patient at this time. Patient given opportunity to voice concerns/ask questions. IV removed per policy and band-aid applied to site. Patient discharged at this time and left Emergency Department with steady gait.  

## 2015-05-13 ENCOUNTER — Encounter: Payer: Self-pay | Admitting: Emergency Medicine

## 2015-05-13 ENCOUNTER — Emergency Department
Admission: EM | Admit: 2015-05-13 | Discharge: 2015-05-13 | Disposition: A | Discharge status: Home / Self Care | Payer: BC Managed Care – PPO | Attending: Student | Admitting: Student

## 2015-05-13 ENCOUNTER — Encounter (HOSPITAL_COMMUNITY): Payer: Self-pay | Admitting: *Deleted

## 2015-05-13 ENCOUNTER — Inpatient Hospital Stay (HOSPITAL_COMMUNITY)
Admission: AD | Admit: 2015-05-13 | Discharge: 2015-05-14 | Disposition: A | Discharge status: Home / Self Care | Payer: BC Managed Care – PPO | Source: Ambulatory Visit | Attending: Family Medicine | Admitting: Family Medicine

## 2015-05-13 ENCOUNTER — Emergency Department: Payer: BC Managed Care – PPO

## 2015-05-13 DIAGNOSIS — Z3A01 Less than 8 weeks gestation of pregnancy: Secondary | ICD-10-CM | POA: Insufficient documentation

## 2015-05-13 DIAGNOSIS — Z880 Allergy status to penicillin: Secondary | ICD-10-CM | POA: Diagnosis not present

## 2015-05-13 DIAGNOSIS — R102 Pelvic and perineal pain: Secondary | ICD-10-CM | POA: Insufficient documentation

## 2015-05-13 DIAGNOSIS — O3680X0 Pregnancy with inconclusive fetal viability, not applicable or unspecified: Secondary | ICD-10-CM

## 2015-05-13 DIAGNOSIS — O209 Hemorrhage in early pregnancy, unspecified: Secondary | ICD-10-CM

## 2015-05-13 DIAGNOSIS — O039 Complete or unspecified spontaneous abortion without complication: Secondary | ICD-10-CM | POA: Insufficient documentation

## 2015-05-13 DIAGNOSIS — N939 Abnormal uterine and vaginal bleeding, unspecified: Secondary | ICD-10-CM

## 2015-05-13 DIAGNOSIS — R58 Hemorrhage, not elsewhere classified: Secondary | ICD-10-CM

## 2015-05-13 DIAGNOSIS — M797 Fibromyalgia: Secondary | ICD-10-CM | POA: Diagnosis not present

## 2015-05-13 DIAGNOSIS — J45909 Unspecified asthma, uncomplicated: Secondary | ICD-10-CM | POA: Diagnosis not present

## 2015-05-13 DIAGNOSIS — Z3490 Encounter for supervision of normal pregnancy, unspecified, unspecified trimester: Secondary | ICD-10-CM

## 2015-05-13 LAB — CBC WITH DIFFERENTIAL/PLATELET
BASOS ABS: 0 10*3/uL (ref 0–0.1)
BASOS PCT: 1 %
EOS ABS: 0.1 10*3/uL (ref 0–0.7)
EOS PCT: 1 %
HEMATOCRIT: 41.9 % (ref 35.0–47.0)
Hemoglobin: 14 g/dL (ref 12.0–16.0)
Lymphocytes Relative: 38 %
Lymphs Abs: 1.8 10*3/uL (ref 1.0–3.6)
MCH: 29.2 pg (ref 26.0–34.0)
MCHC: 33.5 g/dL (ref 32.0–36.0)
MCV: 87 fL (ref 80.0–100.0)
MONO ABS: 0.6 10*3/uL (ref 0.2–0.9)
MONOS PCT: 12 %
Neutro Abs: 2.3 10*3/uL (ref 1.4–6.5)
Neutrophils Relative %: 48 %
PLATELETS: 311 10*3/uL (ref 150–440)
RBC: 4.81 MIL/uL (ref 3.80–5.20)
RDW: 13.2 % (ref 11.5–14.5)
WBC: 4.8 10*3/uL (ref 3.6–11.0)

## 2015-05-13 LAB — COMPREHENSIVE METABOLIC PANEL
ALBUMIN: 4.1 g/dL (ref 3.5–5.0)
ALT: 19 U/L (ref 14–54)
ANION GAP: 7 (ref 5–15)
AST: 20 U/L (ref 15–41)
Alkaline Phosphatase: 57 U/L (ref 38–126)
BILIRUBIN TOTAL: 0.6 mg/dL (ref 0.3–1.2)
BUN: 10 mg/dL (ref 6–20)
CHLORIDE: 107 mmol/L (ref 101–111)
CO2: 26 mmol/L (ref 22–32)
Calcium: 9.3 mg/dL (ref 8.9–10.3)
Creatinine, Ser: 0.85 mg/dL (ref 0.44–1.00)
GFR calc Af Amer: >60 mL/min (ref >60)
GFR calc non Af Amer: >60 mL/min (ref >60)
GLUCOSE: 90 mg/dL (ref 65–99)
POTASSIUM: 3.8 mmol/L (ref 3.5–5.1)
Sodium: 140 mmol/L (ref 135–145)
TOTAL PROTEIN: 7.6 g/dL (ref 6.5–8.1)

## 2015-05-13 LAB — URINALYSIS COMPLETE WITH MICROSCOPIC (ARMC ONLY)
BILIRUBIN URINE: NEGATIVE
Glucose, UA: NEGATIVE mg/dL
Ketones, ur: NEGATIVE mg/dL
Leukocytes, UA: NEGATIVE
Nitrite: NEGATIVE
PH: 6 (ref 5.0–8.0)
Protein, ur: NEGATIVE mg/dL
SPECIFIC GRAVITY, URINE: 1.005 (ref 1.005–1.030)

## 2015-05-13 LAB — HCG, QUANTITATIVE, PREGNANCY: HCG, BETA CHAIN, QUANT, S: 45 m[IU]/mL — AB (ref <5)

## 2015-05-13 LAB — ABO/RH: ABO/RH(D): O POS

## 2015-05-13 NOTE — MAU Note (Addendum)
On 2/20 when to doc and had flu and found out was pregnant. This am awoke and had BM and saw blood in toilet. Afterward had cramping and continued to have bleeding. Went to [facility] today and was told was threatened miscarriage. Cramps come and go. Took Tylenol about 2000 and not helping

## 2015-05-13 NOTE — ED Provider Notes (Signed)
[facility] Emergency Department Provider Note  ____________________________________________  Time seen: Approximately 10:50 AM  I have reviewed the triage vital signs and the nursing notes.   HISTORY  Chief Complaint Vaginal Bleeding    HPI [name] is a 35 y.o. male with history of asthma, migraines, and G1 P0 at approximately 5 weeks estimated gestational age by last menstrual period presents for evaluation of lower abdominal cramping today as well as vaginal bleeding, gradual onset, currently mild, initially moderate, no modifying factors. No nausea, vomiting, diarrhea, fevers or chills. She reports her abdominal pain has significantly improved at this time. No chest pain or difficulty breathing. She has consulted a fertility specialist recently and is at risk for multiple pregnancies.   Past Medical History  Diagnosis Date  . Migraines   . Asthma   . Fibromyalgia   . Depression     Patient Active Problem List   Diagnosis Date Noted  . KNEE PAIN, BILATERAL 01/02/2010  . TENDINITIS, PATELLAR 01/02/2010    Past Surgical History  Procedure Laterality Date  . Bunionectomy Bilateral   . Hernia repair  1991, 2001    inguinal and umbilical  . Eye surgery      blood clot in eye  . Laparoscopic gastric sleeve resection  2015    Current Outpatient Rx  Name  Route  Sig  Dispense  Refill  . diazepam (VALIUM) 2 MG tablet   Oral   Take 1 tablet (2 mg total) by mouth every 8 (eight) hours as needed for muscle spasms.   15 tablet   0   . metroNIDAZOLE (FLAGYL) 500 MG tablet   Oral   Take 1 tablet (500 mg total) by mouth 2 (two) times daily.   14 tablet   0   . oxyCODONE-acetaminophen (ROXICET) 5-325 MG tablet   Oral   Take 1 tablet by mouth every 4 (four) hours as needed for severe pain.   15 tablet   0     Allergies Penicillins; Dexamethasone; Doxycycline; Hydrocodone-acetaminophen; and Other  Family History  Problem Relation Age  of Onset  . Breast cancer Paternal Aunt   . Breast cancer Paternal Aunt     Social History Social History  Substance Use Topics  . Smoking status: Never Smoker   . Smokeless tobacco: None  . Alcohol Use: No    Review of Systems Constitutional: No fever/chills Eyes: No visual changes. ENT: No sore throat. Cardiovascular: Denies chest pain. Respiratory: Denies shortness of breath. Gastrointestinal: + abdominal pain.  No nausea, no vomiting.  No diarrhea.  No constipation. Genitourinary: Negative for dysuria. Musculoskeletal: Negative for back pain. Skin: Negative for rash. Neurological: Negative for headaches, focal weakness or numbness.  10-point ROS otherwise negative.  ____________________________________________   PHYSICAL EXAM:  VITAL SIGNS: ED Triage Vitals  Enc Vitals Group     BP 05/13/15 1037 123/79 mmHg     Pulse Rate 05/13/15 1037 67     Resp 05/13/15 1037 20     Temp 05/13/15 1037 97.2 F (36.2 C)     Temp Source 05/13/15 1037 Axillary     SpO2 05/13/15 1037 100 %     Weight 05/13/15 1037 165 lb (74.844 kg)     Height --      Head Cir --      Peak Flow --      Pain Score 05/13/15 1037 4     Pain Loc --      Pain Edu? --  Excl. in [place]? --     Constitutional: Alert and oriented. Well appearing and in no acute distress. Eyes: Conjunctivae are normal. PERRL. EOMI. Head: Atraumatic. Nose: No congestion/rhinnorhea. Mouth/Throat: Mucous membranes are moist.  Oropharynx non-erythematous. Neck: No stridor. Supple without meningismus. Cardiovascular: Normal rate, regular rhythm. Grossly normal heart sounds.  Good peripheral circulation. Respiratory: Normal respiratory effort.  No retractions. Lungs CTAB. Gastrointestinal: Soft and nontender. No distention. No CVA tenderness. Pelvic: Small amount of dark bleeding in the vaginal vault from a closed os Musculoskeletal: No lower extremity tenderness nor edema.  No joint effusions. Neurologic:  Normal  speech and language. No gross focal neurologic deficits are appreciated. No gait instability. Skin:  Skin is warm, dry and intact. No rash noted. Psychiatric: Mood and affect are normal. Speech and behavior are normal.  ____________________________________________   LABS (all labs ordered are listed, but only abnormal results are displayed)  Labs Reviewed  HCG, QUANTITATIVE, PREGNANCY - Abnormal; Notable for the following:    hCG, Beta Chain, Quant, S 45 (*)    All other components within normal limits  URINALYSIS COMPLETEWITH MICROSCOPIC ([facility] ONLY) - Abnormal; Notable for the following:    Color, Urine YELLOW (*)    APPearance HAZY (*)    Hgb urine dipstick 3+ (*)    Bacteria, UA RARE (*)    Squamous Epithelial / LPF 0-5 (*)    All other components within normal limits  CBC WITH DIFFERENTIAL/PLATELET  COMPREHENSIVE METABOLIC PANEL  ABO/RH   ____________________________________________  EKG  none ____________________________________________  RADIOLOGY  Transvaginal ultrasound IMPRESSION: Pregnancy location not visualized sonographically. Differential diagnosis includes recent spontaneous abortion, IUP too early to visualize, and non-visualized ectopic pregnancy. Recommend close follow up of quantitative B-HCG levels, and follow up US as clinically warranted. ____________________________________________   PROCEDURES  Procedure(s) performed: None  Critical Care performed: No  ____________________________________________   INITIAL IMPRESSION / ASSESSMENT AND PLAN / ED COURSE  Pertinent labs & imaging results that were available during my care of the patient were reviewed by me and considered in my medical decision making (see chart for details).  [name] is a 35 y.o. male with history of asthma, migraines, and G1 P0 at approximately 5 weeks estimated gestational age by last menstrual period presents for evaluation of lower abdominal cramping today as  well as vaginal bleeding. On exam, she is well-appearing and in no acute distress. Vital signs stable, she is afebrile. She has a benign abdominal examination. Suspect threatened miscarriage in the setting of early pregnancy. Plan for screening labs, beta hCG, ultrasound.  ----------------------------------------- 3:21 PM on 05/13/2015 -----------------------------------------  Labs reviewed. CBC and CMP unremarkable. HCG is elevated at 45. Urinalysis with multiple red blood cells as expected. Unable to visualize pregnancy on ultrasound. Discussed with her that she has pregnancy of undetermined location and she needs to follow up with OB/GYN in 48 hours for reevaluation. O+ blood type, no RhoGAM. We discussed return precautions, need for close follow-up and she is comfortable with the discharge plan. DC home. ____________________________________________   FINAL CLINICAL IMPRESSION(S) / ED DIAGNOSES  Final diagnoses:  Bleeding  Pregnant  Pregnancy of unknown anatomic location      Joanne Gavel, MD 05/13/15 [phone]

## 2015-05-13 NOTE — Discharge Instructions (Signed)
You were seen in the emergency department for vaginal bleeding in early pregnancy. The exact location of your pregnancy is not known at this time and you need to follow-up with OB/GYN in 2 days for recheck. Call the number provided on Monday morning and schedule an appointment to be seen. Return immediately to the emergency department if you developssevere or worsening abdominal pain, heavy vaginal bleeding such that you are saturating 1 pad per hour for at least 2 hours, chest pain, difficulty breathing, lightheadedness, fevers, recurrent vomiting, blood in vomit or stools, or for any other concerns.

## 2015-05-13 NOTE — ED Notes (Signed)
Pt to ed with c/o vaginal bleeding and cramping.  Pt states she found out last Monday that she was pregnant.  Pt reports dark red colored vaginal bleeding with small clots.

## 2015-05-13 NOTE — ED Notes (Signed)
Pt will be d/c once d/c papers are printed.

## 2015-05-14 ENCOUNTER — Inpatient Hospital Stay (HOSPITAL_COMMUNITY): Payer: BC Managed Care – PPO

## 2015-05-14 DIAGNOSIS — O039 Complete or unspecified spontaneous abortion without complication: Secondary | ICD-10-CM

## 2015-05-14 LAB — HCG, QUANTITATIVE, PREGNANCY: HCG, BETA CHAIN, QUANT, S: 22 m[IU]/mL — AB (ref <5)

## 2015-05-14 MED ORDER — OXYCODONE-ACETAMINOPHEN 5-325 MG PO TABS: 2.0000 | ORAL_TABLET | ORAL | Status: DC | PRN

## 2015-05-14 MED ORDER — IBUPROFEN 600 MG PO TABS: 600.0000 mg | ORAL_TABLET | Freq: Four times a day (QID) | ORAL | Status: DC | PRN

## 2015-05-14 NOTE — Discharge Instructions (Signed)
Miscarriage  A miscarriage is the sudden loss of an unborn baby (fetus) before the 20th week of pregnancy. Most miscarriages happen in the first 3 months of pregnancy. Sometimes, it happens before a woman even knows she is pregnant. A miscarriage is also called a "spontaneous miscarriage" or "early pregnancy loss." Having a miscarriage can be an emotional experience. Talk with your caregiver about any questions you may have about miscarrying, the grieving process, and your future pregnancy plans.  CAUSES    Problems with the fetal chromosomes that make it impossible for the baby to develop normally. Problems with the baby's genes or chromosomes are most often the result of errors that occur, by chance, as the embryo divides and grows. The problems are not inherited from the parents.   Infection of the cervix or uterus.    Hormone problems.    Problems with the cervix, such as having an incompetent cervix. This is when the tissue in the cervix is not strong enough to hold the pregnancy.    Problems with the uterus, such as an abnormally shaped uterus, uterine fibroids, or congenital abnormalities.    Certain medical conditions.    Smoking, drinking alcohol, or taking illegal drugs.    Trauma.   Often, the cause of a miscarriage is unknown.   SYMPTOMS    Vaginal bleeding or spotting, with or without cramps or pain.   Pain or cramping in the abdomen or lower back.   Passing fluid, tissue, or blood clots from the vagina.  DIAGNOSIS   Your caregiver will perform a physical exam. You may also have an ultrasound to confirm the miscarriage. Blood or urine tests may also be ordered.  TREATMENT    Sometimes, treatment is not necessary if you naturally pass all the fetal tissue that was in the uterus. If some of the fetus or placenta remains in the body (incomplete miscarriage), tissue left behind may become infected and must be removed. Usually, a dilation and curettage (D and C) procedure is performed.  During a D and C procedure, the cervix is widened (dilated) and any remaining fetal or placental tissue is gently removed from the uterus.   Antibiotic medicines are prescribed if there is an infection. Other medicines may be given to reduce the size of the uterus (contract) if there is a lot of bleeding.   If you have Rh negative blood and your baby was Rh positive, you will need a Rh immunoglobulin shot. This shot will protect any future baby from having Rh blood problems in future pregnancies.  HOME CARE INSTRUCTIONS    Your caregiver may order bed rest or may allow you to continue light activity. Resume activity as directed by your caregiver.   Have someone help with home and family responsibilities during this time.    Keep track of the number of sanitary pads you use each day and how soaked (saturated) they are. Write down this information.    Do not use tampons. Do not douche or have sexual intercourse until approved by your caregiver.    Only take over-the-counter or prescription medicines for pain or discomfort as directed by your caregiver.    Do not take aspirin. Aspirin can cause bleeding.    Keep all follow-up appointments with your caregiver.    If you or your partner have problems with grieving, talk to your caregiver or seek counseling to help cope with the pregnancy loss. Allow enough time to grieve before trying to get pregnant again.     SEEK IMMEDIATE MEDICAL CARE IF:    You have severe cramps or pain in your back or abdomen.   You have a fever.   You pass large blood clots (walnut-sized or larger) ortissue from your vagina. Save any tissue for your caregiver to inspect.    Your bleeding increases.    You have a thick, bad-smelling vaginal discharge.   You become lightheaded, weak, or you faint.    You have chills.   MAKE SURE YOU:   Understand these instructions.   Will watch your condition.   Will get help right away if you are not doing well or get worse.     This  information is not intended to replace advice given to you by your health care provider. Make sure you discuss any questions you have with your health care provider.     Document Released: 08/21/2000 Document Revised: 06/22/2012 Document Reviewed: 04/16/2011  Elsevier Interactive Patient Education 2016 [employer]

## 2015-05-14 NOTE — MAU Note (Signed)
[name] CNM in to discuss test results and d/c plan.

## 2015-05-14 NOTE — Progress Notes (Signed)
L Leftwich-Kirby CNM in to discuss test results and d/c plan. Written and verbal d/c instructions given and understanding voiced.

## 2015-05-14 NOTE — MAU Provider Note (Signed)
Chief Complaint: No chief complaint on file.   First Provider Initiated Contact with Patient 05/13/15 2335      SUBJECTIVE HPI: [name] is a 35 y.o. G1P0 at Unknown by LMP who presents to maternity admissions reporting increased bleeding and abdominal pain since her visit at [facility] this morning.  She reports light bleeding and abdominal cramping mostly on the left side brought her to [facility] for evaluation. She had quant hcg of 47 this morning with no IUP or ectopic seen on [place].  She reports the bleeding increased to heavier with clots, requiring her to change 3 pads in 3-4 hours, then slowed down again to light bleeding.  Her cramping abdominal pain increased as well and is not resolved by Tylenol.  Her pain is constant, in the LLQ and suprapubic area.  Her last dose of Tylenol was at 8 pm. She denies vaginal itching/burning, urinary symptoms, h/a, dizziness, n/v, or fever/chills.    Of note, the pt reports a quant hcg of 44 on 2/20 at a facility associated with [facility] and she shows her patient access records with lab results to the provider today.    HPI  Past Medical History  Diagnosis Date  . Migraines   . Asthma   . Fibromyalgia   . Depression    Past Surgical History  Procedure Laterality Date  . Bunionectomy Bilateral   . Hernia repair  1991, 2001    inguinal and umbilical  . Eye surgery      blood clot in eye  . Laparoscopic gastric sleeve resection  2015   Social History   Social History  . Marital Status: Married    Spouse Name: N/A  . Number of Children: N/A  . Years of Education: N/A   Occupational History  . Not on file.   Social History Main Topics  . Smoking status: Never Smoker   . Smokeless tobacco: Not on file  . Alcohol Use: No  . Drug Use: No  . Sexual Activity: Not on file   Other Topics Concern  . Not on file   Social History Narrative   No current facility-administered medications on file prior to encounter.   No current outpatient  prescriptions on file prior to encounter.   Allergies  Allergen Reactions  . Penicillins Anaphylaxis  . Dexamethasone Hives  . Doxycycline Nausea And Vomiting  . Hydrocodone-Acetaminophen Hives  . Nsaids     No nsaids due to gastric surgery  . Other     CHLORAHEXADINE    ROS:  Review of Systems  Constitutional: Negative for fever, chills and fatigue.  Respiratory: Negative for shortness of breath.   Cardiovascular: Negative for chest pain.  Gastrointestinal: Positive for abdominal pain. Negative for nausea and vomiting.  Genitourinary: Positive for vaginal bleeding and pelvic pain. Negative for dysuria, flank pain, vaginal discharge, difficulty urinating and vaginal pain.  Neurological: Negative for dizziness and headaches.  Psychiatric/Behavioral: Negative.      I have reviewed patient's Past Medical Hx, Surgical Hx, Family Hx, Social Hx, medications and allergies.   Physical Exam   Patient Vitals for the past 24 hrs:  BP Temp Pulse Resp Height Weight  05/14/15 0208 109/64 mmHg 97.8 F (36.6 C) 65 18 - -  05/13/15 2243 123/65 mmHg - 77 18 - -  05/13/15 2242 - 98.1 F (36.7 C) - - 5\' 7"  (1.702 m) 164 lb 3.2 oz (74.481 kg)   Constitutional: Well-developed, well-nourished male in no acute distress.  Cardiovascular: normal  rate Respiratory: normal effort GI: Abd soft, non-tender.  No rebound tenderness or guarding. Pos BS x 4 MS: Extremities nontender, no edema, normal ROM Neurologic: Alert and oriented x 4.  GU: Neg CVAT.  PELVIC EXAM: Deferred--done today at [place] Results for orders placed or performed during the hospital encounter of 05/13/15 (from the past 24 hour(s))  hCG, quantitative, pregnancy     Status: Abnormal   Collection Time: 05/13/15 11:48 PM  Result Value Ref Range   hCG, Beta Chain, Quant, S 22 (H) <5 mIU/mL    --/--/O POS (03/04 1051)  IMAGING US Ob Comp Less 14 Wks  05/13/2015  CLINICAL DATA:  Vaginal bleeding with pelvic  pain and cramping since this morning. Positive pregnancy test. Gestational age by LMP of 5 weeks 5 days. EXAM: OBSTETRIC <14 WK [place] AND TRANSVAGINAL OB US TECHNIQUE: Both transabdominal and transvaginal ultrasound examinations were performed for complete evaluation of the gestation as well as the maternal uterus, adnexal regions, and pelvic cul-de-sac. Transvaginal technique was performed to assess early pregnancy. COMPARISON:  None. FINDINGS: No intrauterine gestational sac or other fluid collection visualized in endometrial cavity. No fibroids identified. Both ovaries are normal appearance. No adnexal masses are identified. Trace amount simple free fluid seen pelvic cul-de-sac. IMPRESSION: Pregnancy location not visualized sonographically. Differential diagnosis includes recent spontaneous abortion, IUP too early to visualize, and non-visualized ectopic pregnancy. Recommend close follow up of quantitative B-HCG levels, and follow up US as clinically warranted. Electronically Signed   By: Earle Gell M.D.   On: 05/13/2015 15:03   US Ob Transvaginal  05/14/2015  CLINICAL DATA:  Acute onset of vaginal bleeding.  Initial encounter. EXAM: TRANSVAGINAL OB ULTRASOUND TECHNIQUE: Transvaginal ultrasound was performed for complete evaluation of the gestation as well as the maternal uterus, adnexal regions, and pelvic cul-de-sac. COMPARISON:  None. FINDINGS: Intrauterine gestational sac: None seen. Yolk sac:  N/A Embryo:  N/A Subchorionic hemorrhage:  None visualized. Maternal uterus/adnexae: The uterus is grossly unremarkable in appearance. The ovaries are within normal limits. The right ovary measures 3.2 x 2.6 x 1.4 cm, while the left ovary measures 2.9 x 1.6 x 1.1 cm. No suspicious adnexal masses are seen; there is no evidence for ovarian torsion. Trace free fluid is seen within the pelvic cul-de-sac. IMPRESSION: No intrauterine gestational sac seen. No evidence for ectopic pregnancy. Given the patient's decreasing  quantitative beta HCG level over the course of the day, this likely reflects recent spontaneous abortion. Electronically Signed   By: Garald Balding M.D.   On: 05/14/2015 01:29   US Ob Transvaginal  05/13/2015  CLINICAL DATA:  Vaginal bleeding with pelvic pain and cramping since this morning. Positive pregnancy test. Gestational age by LMP of 5 weeks 5 days. EXAM: OBSTETRIC <14 WK [place] AND TRANSVAGINAL OB US TECHNIQUE: Both transabdominal and transvaginal ultrasound examinations were performed for complete evaluation of the gestation as well as the maternal uterus, adnexal regions, and pelvic cul-de-sac. Transvaginal technique was performed to assess early pregnancy. COMPARISON:  None. FINDINGS: No intrauterine gestational sac or other fluid collection visualized in endometrial cavity. No fibroids identified. Both ovaries are normal appearance. No adnexal masses are identified. Trace amount simple free fluid seen pelvic cul-de-sac. IMPRESSION: Pregnancy location not visualized sonographically. Differential diagnosis includes recent spontaneous abortion, IUP too early to visualize, and non-visualized ectopic pregnancy. Recommend close follow up of quantitative B-HCG levels, and follow up US as clinically warranted. Electronically Signed   By: Sharrie Rothman.D.  On: 05/13/2015 15:03    MAU Management/MDM: Pt reported increased bleeding after quant hcg drawn this morning so quant redrawn tonight.  Significant drop in hcg in less than 24 hours most consistent with SAB.  Pt reports improved pain and lighter bleeding while in MAU without any treatment. Plan for pt to f/u in [place] in 2 weeks.  Precautions given/reasons to return.  Pt stable at time of discharge.  ASSESSMENT 1. SAB (spontaneous abortion)   2. Vaginal bleeding in pregnancy, first trimester     PLAN Discharge home with bleeding precautions F/U in [place] in 2 weeks   Follow-up Information    Follow up with [facility].   Specialty:   Obstetrics and Gynecology   Why:  The clinic will call you with appointment in 2 weeks.  , Return to MAU as needed for emergencies   Contact information:   [place] Certified Nurse-Midwife 05/14/2015  3:16 AM

## 2015-05-29 ENCOUNTER — Encounter: Payer: BC Managed Care – PPO | Admitting: Medical

## 2016-03-11 NOTE — L&D Delivery Note (Addendum)
Operative Delivery Note  Patient's last menstrual period was 05/10/2016 (exact date). Estimated Date of Delivery: 02/14/17 [redacted]w[redacted]d Date of delivery: 02/16/17  At 7:26 PM a viable male was delivered via Vacuum assisted Vaginal Delivery.  Presentation: vertex; Position: Right,, Occiput,, Anterior; Station: +2.  Verbal consent: obtained from patient.  Risks and benefits discussed in detail.  Risks include, bleeding, infection, damage to maternal tissues, fetal cephalhematoma, scalp laceration, and bleeding around the brain which can be life-threatening.  There is also the risk of inability to effect vaginal delivery of the head, or shoulder dystocia that cannot be resolved by established maneuvers, leading to the need for emergency cesarean section.  APGAR:8, 9; weight  6lb 11 oz.   Placenta status: spontaneous, intact.   Cord: 3 vessels, with the following complications:m none apparent.  Cord pH: not collected  Anesthesia:  epidural Instruments: flat kiwi Episiotomy: None Lacerations: 2nd degree;Perineal;Vaginal Suture Repair: 3.0 vicryl rapide Est. Blood Loss (mL):  730cc (measured)  Mom presented to L&D with IOL for past due date.  She was given cytotec, spontaneously ruptured, then pitocin.  epidual placed. Progressed to complete, second stage with moderate maternal effort and exhaustion.  Vacuum offered.  Flat Kiwi placed at estimated vertex, engaged during pushing and released during rest. One pop-off, two contractions until  delivery of fetal head via maternal efforts with restitution to . Anterior then posterior shoulders delivered without difficulty.  Baby placed on mom's chest, and attended to by peds.  Cord was then clamped and cut when pulseless, after >60 seconds.  Placenta spontaneously delivered, intact.   IV pitocin given for hemorrhage prophylaxis.  2nd degree repaired in standard fashion. There was still brisk bleeding, thus IM methergine was given.  No improvement with this  intervention, and a distal vaginal tear was noted on the right, and repaired with modified figure of eight.  Hemostasis noted.  We sang happy birthday to baby "Bean" who was then named [name].     Mom to postpartum.  Baby to Couplet care / Skin to Skin.  Chelsea C Ward 02/16/2017, 7:58 PM

## 2016-06-20 ENCOUNTER — Encounter: Payer: BC Managed Care – PPO | Attending: Obstetrics and Gynecology | Admitting: Dietician

## 2016-06-20 ENCOUNTER — Encounter: Payer: Self-pay | Admitting: Dietician

## 2016-06-20 VITALS — Ht 70.0 in | Wt 178.1 lb

## 2016-06-20 DIAGNOSIS — F909 Attention-deficit hyperactivity disorder, unspecified type: Secondary | ICD-10-CM | POA: Insufficient documentation

## 2016-06-20 DIAGNOSIS — M797 Fibromyalgia: Secondary | ICD-10-CM | POA: Insufficient documentation

## 2016-06-20 DIAGNOSIS — G43909 Migraine, unspecified, not intractable, without status migrainosus: Secondary | ICD-10-CM | POA: Insufficient documentation

## 2016-06-20 DIAGNOSIS — J45909 Unspecified asthma, uncomplicated: Secondary | ICD-10-CM | POA: Insufficient documentation

## 2016-06-20 DIAGNOSIS — N83209 Unspecified ovarian cyst, unspecified side: Secondary | ICD-10-CM | POA: Insufficient documentation

## 2016-06-20 DIAGNOSIS — Z9884 Bariatric surgery status: Secondary | ICD-10-CM

## 2016-06-20 DIAGNOSIS — Z9889 Other specified postprocedural states: Secondary | ICD-10-CM

## 2016-06-20 DIAGNOSIS — Z3A01 Less than 8 weeks gestation of pregnancy: Secondary | ICD-10-CM

## 2016-06-20 DIAGNOSIS — F32A Depression, unspecified: Secondary | ICD-10-CM | POA: Insufficient documentation

## 2016-06-20 DIAGNOSIS — F329 Major depressive disorder, single episode, unspecified: Secondary | ICD-10-CM | POA: Insufficient documentation

## 2016-06-20 DIAGNOSIS — N6019 Diffuse cystic mastopathy of unspecified breast: Secondary | ICD-10-CM | POA: Insufficient documentation

## 2016-06-20 DIAGNOSIS — F419 Anxiety disorder, unspecified: Secondary | ICD-10-CM | POA: Insufficient documentation

## 2016-06-20 NOTE — Patient Instructions (Addendum)
   Continue to eat at regular intervals during the day, every 2-4 hours.   You seem to be eating plenty of protein and consuming nutritious food choices, great job!  Call or email with any questions or concerns, or if you need to follow up.

## 2016-06-20 NOTE — Progress Notes (Signed)
Medical Nutrition Therapy: Visit start time: 1500  end time: 1600  Assessment:  Diagnosis: pregnancy, history of bariatric surgery Past medical history: no other significant physical history Psychosocial issues/ stress concerns: history of depression; anxiety, ADHD Preferred learning method:  . Hands-on  Current weight: 178.1lbs  Height: 5'10" Medications, supplements: reconciled list in medical record  Progress and evaluation: Patient had gastric bypass surgery 02/2014 and lost about 130lbs. She has in the past drifted away from a regular eating schedule and attention to proper nutrition, but has worked to improve her diet since becoming pregnant. She has had a miscarriage in the past and is concerned about ensuring a safe and healthy pregnancy.   Physical activity: some speed walking for 30-45 minutes, 3 times a week, but none recently  Dietary Intake:  Usual eating pattern includes 3 meals and 3 snacks per day. Dining out frequency: 0 meals per week.  Recent intake: Breakfast: 3/4 bottle water, breakfast 30 minutes later-- egg whites, black beans, cottage cheese; oatmeal, berries; egg white, spinach, salsa; once had donut holes more water at work-- and 1/2 G2 gatorade Snack: banana with peanut butter; chicken nuggets Protein shake and water, G2 Lunch: burger patty no bread, broccoli; veg curry, mac and cheese Snack: water, pedialyte; blueberries Supper: chicken, black beans, asparagus; taco meat, cheese, salsa, vegetables Snack: okra and walnuts Beverages: water, G2 gatorade, stays thirsty.   Nutrition Care Education: Topics covered: pregnancy nutrition with bariatric diet Basic nutrition: basic food groups, appropriate nutrient balance, appropriate meal and snack schedule  Bariatric diet: caloric and protein needs for pregnancy-- provided bariatric menus for 1500kcal and 1800kcal and advised patient to increase kcal intake as pregnancy progresses. Discussed weight gain goals for  pregnancy to be at least 20lbs and advised patient to increase kcal intake further if she does not experience any weight gain.   Nutritional Diagnosis:  NI-5.11.1 Predicted suboptimal nutrient intake As related to gastric bypass surgery.  As evidenced by history of low vitamin stores.  Intervention: Instruction as noted above.   Patient has worked to optimize her vitamin and mineral intake and supplementation and recent labwork shows improved levels.    Her protein intake appears to be adequate.    Advised her to call if she does not begin gaining weight in the next several weeks.   Education Materials given:  Marland Kitchen Menus-- bariatric menus for 1500 and 1800kcal (AND) . Goals/ instructions   Learner/ who was taught:  . Patient   Level of understanding: Marland Kitchen Verbalizes/ demonstrates competency  Demonstrated degree of understanding via:   Teach back Learning barriers: . None  Willingness to learn/ readiness for change: . Eager, change in progress  Monitoring and Evaluation:  Dietary intake, exercise, pregnancy status, and body weight      follow up: prn

## 2016-06-24 ENCOUNTER — Encounter: Payer: Self-pay | Admitting: *Deleted

## 2016-06-24 ENCOUNTER — Observation Stay
Admission: EM | Admit: 2016-06-24 | Discharge: 2016-06-26 | Disposition: A | Discharge status: Home / Self Care | Payer: BC Managed Care – PPO | Attending: Internal Medicine | Admitting: Internal Medicine

## 2016-06-24 ENCOUNTER — Emergency Department: Payer: BC Managed Care – PPO

## 2016-06-24 ENCOUNTER — Ambulatory Visit: Payer: BC Managed Care – PPO | Admitting: Dietician

## 2016-06-24 DIAGNOSIS — O3481 Maternal care for other abnormalities of pelvic organs, first trimester: Secondary | ICD-10-CM | POA: Insufficient documentation

## 2016-06-24 DIAGNOSIS — Z886 Allergy status to analgesic agent status: Secondary | ICD-10-CM | POA: Diagnosis not present

## 2016-06-24 DIAGNOSIS — E871 Hypo-osmolality and hyponatremia: Secondary | ICD-10-CM | POA: Insufficient documentation

## 2016-06-24 DIAGNOSIS — Z3A01 Less than 8 weeks gestation of pregnancy: Secondary | ICD-10-CM | POA: Insufficient documentation

## 2016-06-24 DIAGNOSIS — R112 Nausea with vomiting, unspecified: Secondary | ICD-10-CM

## 2016-06-24 DIAGNOSIS — N83202 Unspecified ovarian cyst, left side: Secondary | ICD-10-CM | POA: Insufficient documentation

## 2016-06-24 DIAGNOSIS — A0811 Acute gastroenteropathy due to Norwalk agent: Secondary | ICD-10-CM | POA: Diagnosis present

## 2016-06-24 DIAGNOSIS — E876 Hypokalemia: Secondary | ICD-10-CM | POA: Diagnosis not present

## 2016-06-24 DIAGNOSIS — O208 Other hemorrhage in early pregnancy: Secondary | ICD-10-CM | POA: Insufficient documentation

## 2016-06-24 DIAGNOSIS — Z881 Allergy status to other antibiotic agents status: Secondary | ICD-10-CM | POA: Diagnosis not present

## 2016-06-24 DIAGNOSIS — R197 Diarrhea, unspecified: Secondary | ICD-10-CM

## 2016-06-24 DIAGNOSIS — O99841 Bariatric surgery status complicating pregnancy, first trimester: Secondary | ICD-10-CM | POA: Diagnosis not present

## 2016-06-24 DIAGNOSIS — Z3491 Encounter for supervision of normal pregnancy, unspecified, first trimester: Secondary | ICD-10-CM

## 2016-06-24 DIAGNOSIS — I959 Hypotension, unspecified: Secondary | ICD-10-CM | POA: Insufficient documentation

## 2016-06-24 DIAGNOSIS — Z880 Allergy status to penicillin: Secondary | ICD-10-CM | POA: Diagnosis not present

## 2016-06-24 DIAGNOSIS — Z888 Allergy status to other drugs, medicaments and biological substances status: Secondary | ICD-10-CM | POA: Insufficient documentation

## 2016-06-24 DIAGNOSIS — Z87891 Personal history of nicotine dependence: Secondary | ICD-10-CM | POA: Diagnosis not present

## 2016-06-24 DIAGNOSIS — R1110 Vomiting, unspecified: Secondary | ICD-10-CM | POA: Diagnosis present

## 2016-06-24 DIAGNOSIS — O26891 Other specified pregnancy related conditions, first trimester: Principal | ICD-10-CM | POA: Insufficient documentation

## 2016-06-24 DIAGNOSIS — Z91048 Other nonmedicinal substance allergy status: Secondary | ICD-10-CM | POA: Diagnosis not present

## 2016-06-24 LAB — GASTROINTESTINAL PANEL BY PCR, STOOL (REPLACES STOOL CULTURE)
Adenovirus F40/41: NOT DETECTED
Astrovirus: NOT DETECTED
CRYPTOSPORIDIUM: NOT DETECTED
Campylobacter species: NOT DETECTED
Cyclospora cayetanensis: NOT DETECTED
ENTAMOEBA HISTOLYTICA: NOT DETECTED
ENTEROAGGREGATIVE E COLI (EAEC): NOT DETECTED
Enteropathogenic E coli (EPEC): NOT DETECTED
Enterotoxigenic E coli (ETEC): NOT DETECTED
GIARDIA LAMBLIA: NOT DETECTED
Norovirus GI/GII: DETECTED — AB
Plesimonas shigelloides: NOT DETECTED
Rotavirus A: NOT DETECTED
SALMONELLA SPECIES: NOT DETECTED
SHIGELLA/ENTEROINVASIVE E COLI (EIEC): NOT DETECTED
Sapovirus (I, II, IV, and V): NOT DETECTED
Shiga like toxin producing E coli (STEC): NOT DETECTED
VIBRIO CHOLERAE: NOT DETECTED
Vibrio species: NOT DETECTED
YERSINIA ENTEROCOLITICA: NOT DETECTED

## 2016-06-24 LAB — URINALYSIS, COMPLETE (UACMP) WITH MICROSCOPIC
BILIRUBIN URINE: NEGATIVE
Bacteria, UA: NONE SEEN
GLUCOSE, UA: NEGATIVE mg/dL
HGB URINE DIPSTICK: NEGATIVE
Ketones, ur: 20 mg/dL — AB
LEUKOCYTES UA: NEGATIVE
NITRITE: NEGATIVE
Protein, ur: NEGATIVE mg/dL
RBC / HPF: NONE SEEN RBC/hpf (ref 0–5)
Specific Gravity, Urine: 1.023 (ref 1.005–1.030)
pH: 5 (ref 5.0–8.0)

## 2016-06-24 LAB — COMPREHENSIVE METABOLIC PANEL
ALT: 16 U/L (ref 14–54)
ANION GAP: 5 (ref 5–15)
AST: 18 U/L (ref 15–41)
Albumin: 3.9 g/dL (ref 3.5–5.0)
Alkaline Phosphatase: 40 U/L (ref 38–126)
BILIRUBIN TOTAL: 0.8 mg/dL (ref 0.3–1.2)
BUN: 12 mg/dL (ref 6–20)
CALCIUM: 9 mg/dL (ref 8.9–10.3)
CO2: 22 mmol/L (ref 22–32)
Chloride: 107 mmol/L (ref 101–111)
Creatinine, Ser: 0.83 mg/dL (ref 0.44–1.00)
GLUCOSE: 98 mg/dL (ref 65–99)
POTASSIUM: 3.9 mmol/L (ref 3.5–5.1)
Sodium: 134 mmol/L — ABNORMAL LOW (ref 135–145)
TOTAL PROTEIN: 7.1 g/dL (ref 6.5–8.1)

## 2016-06-24 LAB — CBC
HCT: 40.8 % (ref 35.0–47.0)
HEMOGLOBIN: 14.1 g/dL (ref 12.0–16.0)
MCH: 30 pg (ref 26.0–34.0)
MCHC: 34.6 g/dL (ref 32.0–36.0)
MCV: 86.9 fL (ref 80.0–100.0)
Platelets: 236 10*3/uL (ref 150–440)
RBC: 4.7 MIL/uL (ref 3.80–5.20)
RDW: 12.9 % (ref 11.5–14.5)
WBC: 7.8 10*3/uL (ref 3.6–11.0)

## 2016-06-24 LAB — LIPASE, BLOOD: Lipase: 26 U/L (ref 11–51)

## 2016-06-24 LAB — HCG, QUANTITATIVE, PREGNANCY: hCG, Beta Chain, Quant, S: 45152 m[IU]/mL — ABNORMAL HIGH (ref <5)

## 2016-06-24 MED ORDER — ONDANSETRON HCL 4 MG/2ML IJ SOLN
4.0000 mg | Freq: Four times a day (QID) | INTRAMUSCULAR | Status: DC | PRN
  Administered 2016-06-24: 4 mg via INTRAVENOUS
  Filled 2016-06-24: qty 2

## 2016-06-24 MED ORDER — PRENATAL MULTIVITAMIN CH
1.0000 | ORAL_TABLET | Freq: Every day | ORAL | Status: DC
  Administered 2016-06-25: 13:00:00 1 via ORAL
  Filled 2016-06-24 (×2): qty 1

## 2016-06-24 MED ORDER — MORPHINE SULFATE (PF) 2 MG/ML IV SOLN
2.0000 mg | Freq: Once | INTRAVENOUS | Status: AC
  Administered 2016-06-24: 2 mg via INTRAVENOUS
  Filled 2016-06-24: qty 1

## 2016-06-24 MED ORDER — SODIUM CHLORIDE 0.9 % IV BOLUS (SEPSIS)
1000.0000 mL | Freq: Once | INTRAVENOUS | Status: AC
  Administered 2016-06-24: 1000 mL via INTRAVENOUS

## 2016-06-24 MED ORDER — ONDANSETRON HCL 4 MG/2ML IJ SOLN
4.0000 mg | Freq: Once | INTRAMUSCULAR | Status: AC
  Administered 2016-06-24: 4 mg via INTRAVENOUS
  Filled 2016-06-24: qty 2

## 2016-06-24 MED ORDER — ONDANSETRON HCL 4 MG PO TABS: 4.0000 mg | ORAL_TABLET | Freq: Four times a day (QID) | ORAL | Status: DC | PRN

## 2016-06-24 MED ORDER — SODIUM CHLORIDE 0.9 % IV SOLN
INTRAVENOUS | Status: DC
  Administered 2016-06-24 – 2016-06-25 (×2): via INTRAVENOUS

## 2016-06-24 MED ORDER — ACETAMINOPHEN 325 MG PO TABS: 650.0000 mg | ORAL_TABLET | Freq: Four times a day (QID) | ORAL | Status: DC | PRN

## 2016-06-24 MED ORDER — CALCIUM CITRATE 950 (200 CA) MG PO TABS
1.0000 | ORAL_TABLET | Freq: Three times a day (TID) | ORAL | Status: DC
  Administered 2016-06-25: 11:00:00 200 mg via ORAL
  Filled 2016-06-24 (×4): qty 1

## 2016-06-24 MED ORDER — ACETAMINOPHEN 650 MG RE SUPP: 650.0000 mg | Freq: Four times a day (QID) | RECTAL | Status: DC | PRN

## 2016-06-24 MED ORDER — VITAMIN D 1000 UNITS PO TABS
1000.0000 [IU] | ORAL_TABLET | Freq: Every day | ORAL | Status: DC
  Administered 2016-06-25: 11:00:00 1000 [IU] via ORAL
  Filled 2016-06-24: qty 1

## 2016-06-24 MED ORDER — MORPHINE SULFATE (PF) 2 MG/ML IV SOLN: 1.0000 mg | INTRAVENOUS | Status: DC | PRN

## 2016-06-24 NOTE — Discharge Instructions (Signed)
You are found to have the gastrointestinal virus called neurovirus. Continue to drink plenty of fluids.  Your ultrasound shows one fetus with heart rate visible.  Please follow-up with your primary care doctor and ob gyn.  There is a cyst on the left side, follow-up with your OB/GYN.  Return to the emergency department immediately for any worsening symptoms including concern for dehydration, weakness, numbness, black or bloody stool, vaginal bleeding, or any other symptoms concerning to you.

## 2016-06-24 NOTE — ED Triage Notes (Signed)
Pt states vomiting and diarrhea that began in the middle of the night, foster child she keeps had stomach virus on Saturday, pt is [redacted] weeks pregnant, pt dry heaving in triage

## 2016-06-24 NOTE — ED Notes (Signed)
Resting with eyes closed, resp unlabored.  

## 2016-06-24 NOTE — ED Notes (Signed)
At pt request she was given ice water and saltine crackers

## 2016-06-24 NOTE — ED Provider Notes (Addendum)
[facility] Emergency Department Provider Note ____________________________________________   I have reviewed the triage vital signs and the triage nursing note.  HISTORY  Chief Complaint Emesis   Historian Patient  HPI [name] is a 36 y.o. male presenting for complaint of dehydration. She reports approximately [redacted] weeks pregnant, using Clomid and insemination, and has not had an ultrasound yet.  She has had some nausea and vomiting on Friday and Saturday, a few days ago. She additionally had a few episodes of vomiting yesterday. Yesterday she started having diarrhea, numerous episodes of watery and nonbloody diarrhea. She does have a foster daughter of 103 months old that was at a daycare facility that had some sort of gastroenteritis outbreak. Her daughter had diarrhea on Saturday, and she started having diarrhea on Sunday, which was yesterday.      Past Medical History:  Diagnosis Date  . Asthma   . Depression   . Fibromyalgia   . Migraines     Patient Active Problem List   Diagnosis Date Noted  . ADHD (attention deficit hyperactivity disorder) 06/20/2016  . Anxiety 06/20/2016  . Asthma without status asthmaticus 06/20/2016  . Depression 06/20/2016  . Fibrocystic breast disease 06/20/2016  . Fibromyalgia 06/20/2016  . Migraine 06/20/2016  . Ovarian cyst 06/20/2016  . Morbid obesity ([place]) 02/24/2014  . Headache 07/26/2013  . Pre-syncope 07/26/2013  . KNEE PAIN, BILATERAL 01/02/2010  . TENDINITIS, PATELLAR 01/02/2010    Past Surgical History:  Procedure Laterality Date  . BUNIONECTOMY Bilateral   . EYE SURGERY     blood clot in eye  . HERNIA REPAIR  1991, 2001   inguinal and umbilical  . LAPAROSCOPIC GASTRIC SLEEVE RESECTION  2015    Prior to Admission medications   Medication Sig Start Date End Date Taking? Authorizing Provider  B Complex-C (SUPER B COMPLEX/VITAMIN C PO) Take 1 tablet by mouth daily.    Yes Historical Provider, MD   calcium gluconate 500 MG tablet Take 1 tablet by mouth 3 (three) times daily.   Yes Historical Provider, MD  cholecalciferol (VITAMIN D) 1000 units tablet Take 1,000 Units by mouth daily.   Yes Historical Provider, MD  Prenatal Vit-Fe Fumarate-FA (PRENATAL MULTIVITAMIN) TABS tablet Take 1 tablet by mouth daily at 12 noon.   Yes Historical Provider, MD  ibuprofen (ADVIL,MOTRIN) 600 MG tablet Take 1 tablet (600 mg total) by mouth every 6 (six) hours as needed. Patient not taking: Reported on 06/20/2016 05/14/15   Kathie Dike Leftwich-Kirby, CNM  oxyCODONE-acetaminophen (PERCOCET/ROXICET) 5-325 MG tablet Take 2 tablets by mouth every 4 (four) hours as needed for severe pain. Patient not taking: Reported on 06/20/2016 05/14/15   Kathie Dike Leftwich-Kirby, CNM    Allergies  Allergen Reactions  . Penicillins Anaphylaxis  . Clonazepam     Indifference to life attitude  . Dexamethasone Hives  . Doxycycline Nausea And Vomiting    Severe vomiting  . Fluoxetine Other (See Comments)    Suicidal thoughts  . Nsaids     No nsaids due to gastric surgery  . Other     CHLORAHEXADINE  . Hydrocodone-Acetaminophen Hives and Rash  . Tape Rash  . Tuberculin Ppd Rash    Family History  Problem Relation Age of Onset  . Breast cancer Paternal Aunt   . Breast cancer Paternal Aunt     Social History Social History  Substance Use Topics  . Smoking status: Former Smoker    Quit date: 06/21/2003  . Smokeless tobacco: Never  Used  . Alcohol use No    Review of Systems  Constitutional: Negative for fever. Eyes: Negative for visual changes. ENT: Negative for sore throat. Cardiovascular: Negative for chest pain. Respiratory: Negative for shortness of breath. Gastrointestinal:MIld crampy abdominal pains, intermittent.  Water diarrhea.  Non  Bloody emesis. Genitourinary: Negative for dysuria. Musculoskeletal: Negative for back pain. Skin: Negative for rash. Neurological: Negative for headache. 10 point Review of  Systems otherwise negative ____________________________________________   PHYSICAL EXAM:  VITAL SIGNS: ED Triage Vitals [06/24/16 0726]  Enc Vitals Group     BP (!) 105/58     Pulse Rate 89     Resp 18     Temp 98.7 F (37.1 C)     Temp Source Oral     SpO2 100 %     Weight 170 lb (77.1 kg)     Height 5\' 10"  (1.778 m)     Head Circumference      Peak Flow      Pain Score 8     Pain Loc      Pain Edu?      Excl. in [place]?      Constitutional: Alert and oriented. Well appearing and in no distress. HEENT   Head: Normocephalic and atraumatic.      Eyes: Conjunctivae are normal. PERRL. Normal extraocular movements.      Ears:         Nose: No congestion/rhinnorhea.   Mouth/Throat: Mucous membranes are moist.   Neck: No stridor. Cardiovascular/Chest: Normal rate, regular rhythm.  No murmurs, rubs, or gallops. Respiratory: Normal respiratory effort without tachypnea nor retractions. Breath sounds are clear and equal bilaterally. No wheezes/rales/rhonchi. Gastrointestinal: Soft. No distention, no guarding, no rebound. Very mild discomfort left side.  Genitourinary/rectal:Deferred Musculoskeletal: Nontender with normal range of motion in all extremities. No joint effusions.  No lower extremity tenderness.  No edema. Neurologic:  Normal speech and language. No gross or focal neurologic deficits are appreciated. Skin:  Skin is warm, dry and intact. No rash noted. Psychiatric: Mood and affect are normal. Speech and behavior are normal. Patient exhibits appropriate insight and judgment.   ____________________________________________  LABS (pertinent positives/negatives)  Labs Reviewed  GASTROINTESTINAL PANEL BY PCR, STOOL (REPLACES STOOL CULTURE) - Abnormal; Notable for the following:       Result Value   Norovirus GI/GII DETECTED (*)    All other components within normal limits  COMPREHENSIVE METABOLIC PANEL - Abnormal; Notable for the following:    Sodium 134 (*)     All other components within normal limits  URINALYSIS, COMPLETE (UACMP) WITH MICROSCOPIC - Abnormal; Notable for the following:    Color, Urine YELLOW (*)    APPearance HAZY (*)    Ketones, ur 20 (*)    Squamous Epithelial / LPF 0-5 (*)    All other components within normal limits  HCG, QUANTITATIVE, PREGNANCY - Abnormal; Notable for the following:    hCG, Beta Chain, Quant, S 45,152 (*)    All other components within normal limits  LIPASE, BLOOD  CBC    ____________________________________________    EKG I, Lisa Roca, MD, the attending physician have personally viewed and interpreted all ECGs.  [facility] ____________________________________________  RADIOLOGY All Xrays were viewed by me. Imaging interpreted by Radiologist.  US pelvic and transvag:  IMPRESSION: 1. Single viable intrauterine pregnancy at 6 weeks 3 days. Fetal cardiac activity noted with a fetal heart rate of 115 beats per minute.  2. Small subchorionic hemorrhage.  3. 2.8  cm complex cyst left ovary, most likely corpus luteal cyst. Follow-up exam to demonstrate resolution suggested.  4. Trace free pelvic fluid. __________________________________________  PROCEDURES  Procedure(s) performed: None  Critical Care performed: None  ____________________________________________   ED COURSE / ASSESSMENT AND PLAN  Pertinent labs & imaging results that were available during my care of the patient were reviewed by me and considered in my medical decision making (see chart for details).   Little dry clinically, but with stable vital signs. Clinically sound like a viral gastroenteritis. However she is early pregnant, and has not had confirmation of IUP.  Laboratory studies are reassuring. On her stool sample as she did test positive for neurovirus.  She is given IV fluids here in the emergency department.  Denies vaginal bleeding and vaginal discharge. Pelvic exam was deferred.  Vaginal ultrasound  confirms 1 single IUP. We discussed the subchronic hemorrhage and left ovarian cysts which need ob gyn follow-up.    CONSULTATIONS: Hospitalist for admission.  Patient / Family / Caregiver informed of clinical course, medical decision-making process, and agree with plan.  Addended to include, on reevaluation around 1:15, patient was reporting multiple other episodes of diarrhea and looking extremely puny. Ongoing nausea and is now requesting treatment for the nausea and vomiting. We discussed risks and benefit and chose to proceed with Zofran. She is also going to take a dose of morphine for abdominal cramping which is causing her to follow up. She is going to require hospital admission due to intractable nausea and vomiting. ___________________________________________   FINAL CLINICAL IMPRESSION(S) / ED DIAGNOSES   Final diagnoses:  Nausea vomiting and diarrhea  First trimester pregnancy  Left ovarian cyst  Norovirus  Intractable diarrhea  Intractable vomiting with nausea, unspecified vomiting type              Note: This dictation was prepared with Dragon dictation. Any transcriptional errors that result from this process are unintentional    Lisa Roca, MD 06/24/16 [place], MD 06/24/16 [phone]

## 2016-06-24 NOTE — ED Notes (Signed)
Lab reported norovirus to this nurse and this nurse reported to Dr Reita Cliche

## 2016-06-24 NOTE — H&P (Signed)
[facility] [place] NAME: [name]    MR#:  [id number]  DATE OF BIRTH:  Dec 19, 1980  DATE OF ADMISSION:  06/24/2016  PRIMARY CARE PHYSICIAN: Marinda Elk, MD   REQUESTING/REFERRING PHYSICIAN: Dr Lisa Roca  CHIEF COMPLAINT:   Chief Complaint  Patient presents with  . Emesis    HISTORY OF PRESENT ILLNESS:  [name]  is a 36 y.o. male presenting to the ER with nausea vomiting diarrhea. They have a 20-month-old [name] child that she took care of with neurovirus on Saturday. At 1 AM this morning she felt nauseous and dizzy. 20 minutes later the nausea vomiting and diarrhea happened. She is unable to keep any food down. She has a history of gastric sleeve procedure back in 2015. She has severe abdominal pain when episode of sweating happens and then pain than the diarrhea. Her whole body hurts. She feels very weak. In the ER she was found to have neurovirus. She was also found to be [redacted] weeks pregnant.  PAST MEDICAL HISTORY:   Past Medical History:  Diagnosis Date  . Asthma   . Depression   . Fibromyalgia   . Migraines     PAST SURGICAL HISTORY:   Past Surgical History:  Procedure Laterality Date  . BUNIONECTOMY Bilateral   . EYE SURGERY     blood clot in eye  . HERNIA REPAIR  1991, 2001   inguinal and umbilical  . LAPAROSCOPIC GASTRIC SLEEVE RESECTION  2015    SOCIAL HISTORY:   Social History  Substance Use Topics  . Smoking status: Former Smoker    Quit date: 06/21/2003  . Smokeless tobacco: Never Used  . Alcohol use No    FAMILY HISTORY:   Family History  Problem Relation Age of Onset  . Breast cancer Paternal Aunt   . Breast cancer Paternal Aunt   . Healthy Mother   . CVA Father   . Hypertension Father   . Diabetes Father     DRUG ALLERGIES:   Allergies  Allergen Reactions  . Penicillins Anaphylaxis  . Clonazepam     Indifference to life attitude  . Dexamethasone Hives  .  Doxycycline Nausea And Vomiting    Severe vomiting  . Fluoxetine Other (See Comments)    Suicidal thoughts  . Nsaids     No nsaids due to gastric surgery  . Other     CHLORAHEXADINE  . Hydrocodone-Acetaminophen Hives and Rash  . Tape Rash  . Tuberculin Ppd Rash    REVIEW OF SYSTEMS:  CONSTITUTIONAL: No fever. Positive chills and sweats. Positive for fatigue and weakness.  EYES: No blurred or double vision. Wears glasses EARS, NOSE, AND THROAT: No tinnitus or ear pain. No sore throat RESPIRATORY: No cough, shortness of breath, wheezing or hemoptysis.  CARDIOVASCULAR: No chest pain, orthopnea, edema.  GASTROINTESTINAL: Positive for nausea, vomiting, diarrhea and abdominal pain. No blood in bowel movements GENITOURINARY: No dysuria, hematuria.  ENDOCRINE: No polyuria, nocturia,  HEMATOLOGY: No anemia, easy bruising or bleeding SKIN: No rash or lesion. MUSCULOSKELETAL: Positive for body pains NEUROLOGIC: No tingling, numbness, weakness.  PSYCHIATRY: No anxiety or depression.   MEDICATIONS AT HOME:   Prior to Admission medications   Medication Sig Start Date End Date Taking? Authorizing Provider  B Complex-C (SUPER B COMPLEX/VITAMIN C PO) Take 1 tablet by mouth daily.    Yes Historical Provider, MD  calcium gluconate 500 MG tablet Take 1 tablet by mouth 3 (three) times  daily.   Yes Historical Provider, MD  cholecalciferol (VITAMIN D) 1000 units tablet Take 1,000 Units by mouth daily.   Yes Historical Provider, MD  Prenatal Vit-Fe Fumarate-FA (PRENATAL MULTIVITAMIN) TABS tablet Take 1 tablet by mouth daily at 12 noon.   Yes Historical Provider, MD      VITAL SIGNS:  Blood pressure 108/65, pulse 86, temperature 98.7 F (37.1 C), temperature source Oral, resp. rate 16, height 5\' 10"  (1.778 m), weight 77.1 kg (170 lb), last menstrual period 05/10/2016, SpO2 100 %.  PHYSICAL EXAMINATION:  GENERAL:  36 y.o.-year-old patient lying in the bed with no acute distress.  EYES: Pupils  equal, round, reactive to light and accommodation. No scleral icterus. Extraocular muscles intact.  HEENT: Head atraumatic, normocephalic. Oropharynx and nasopharynx clear.  NECK:  Supple, no jugular venous distention. No thyroid enlargement, no tenderness.  LUNGS: Normal breath sounds bilaterally, no wheezing, rales,rhonchi or crepitation. No use of accessory muscles of respiration.  CARDIOVASCULAR: S1, S2 normal. No murmurs, rubs, or gallops.  ABDOMEN: Soft, nontender, nondistended. Bowel sounds present. No organomegaly or mass.  EXTREMITIES: No pedal edema, cyanosis, or clubbing.  NEUROLOGIC: Cranial nerves II through XII are intact. Muscle strength 5/5 in all extremities. Sensation intact. Gait not checked.  PSYCHIATRIC: The patient is alert and oriented x 3.  SKIN: No rash, lesion, or ulcer.   LABORATORY PANEL:   CBC  Recent Labs Lab 06/24/16 0824  WBC 7.8  HGB 14.1  HCT 40.8  PLT 236   ------------------------------------------------------------------------------------------------------------------  Chemistries   Recent Labs Lab 06/24/16 0824  NA 134*  K 3.9  CL 107  CO2 22  GLUCOSE 98  BUN 12  CREATININE 0.83  CALCIUM 9.0  AST 18  ALT 16  ALKPHOS 40  BILITOT 0.8   ------------------------------------------------------------------------------------------------------------------ ----------------------------------------------------------------------  RADIOLOGY:  US Ob Comp Less 14 Wks  Result Date: 06/24/2016 CLINICAL DATA:  Nausea.  Abdominal pain.  Diarrhea. EXAM: OBSTETRIC <14 WK [place] AND TRANSVAGINAL OB US TECHNIQUE: Both transabdominal and transvaginal ultrasound examinations were performed for complete evaluation of the gestation as well as the maternal uterus, adnexal regions, and pelvic cul-de-sac. Transvaginal technique was performed to assess early pregnancy. COMPARISON:  Ultrasound 05/14/2015 . FINDINGS: Intrauterine gestational sac: Single Yolk sac:   Present Embryo:  Present Cardiac Activity: Present Heart Rate: 115  bpm CRL:  0.6 cm 6 w   3 d                  [place] EDC: 02/14/2017 Subchorionic hemorrhage:  Small. Maternal uterus/adnexae: 2.8 x 2.2 x 2.0 cm complex cyst left ovary, most likely corpus luteal cyst. Follow-up to demonstrate resolution suggested. Trace amount of free pelvic fluid noted. IMPRESSION: 1. Single viable intrauterine pregnancy at 6 weeks 3 days. Fetal cardiac activity noted with a fetal heart rate of 115 beats per minute. 2.  Small subchorionic hemorrhage. 3. 2.8 cm complex cyst left ovary, most likely corpus luteal cyst. Follow-up exam to demonstrate resolution suggested. 4. Trace free pelvic fluid. Electronically Signed   By: Marcello Moores  Register   On: 06/24/2016 10:35   US Ob Transvaginal  Result Date: 06/24/2016 CLINICAL DATA:  Nausea.  Abdominal pain.  Diarrhea. EXAM: OBSTETRIC <14 WK [place] AND TRANSVAGINAL OB US TECHNIQUE: Both transabdominal and transvaginal ultrasound examinations were performed for complete evaluation of the gestation as well as the maternal uterus, adnexal regions, and pelvic cul-de-sac. Transvaginal technique was performed to assess early pregnancy. COMPARISON:  Ultrasound 05/14/2015 . FINDINGS: Intrauterine gestational sac: Single Yolk  sac:  Present Embryo:  Present Cardiac Activity: Present Heart Rate: 115  bpm CRL:  0.6 cm 6 w   3 d                  [place] EDC: 02/14/2017 Subchorionic hemorrhage:  Small. Maternal uterus/adnexae: 2.8 x 2.2 x 2.0 cm complex cyst left ovary, most likely corpus luteal cyst. Follow-up to demonstrate resolution suggested. Trace amount of free pelvic fluid noted. IMPRESSION: 1. Single viable intrauterine pregnancy at 6 weeks 3 days. Fetal cardiac activity noted with a fetal heart rate of 115 beats per minute. 2.  Small subchorionic hemorrhage. 3. 2.8 cm complex cyst left ovary, most likely corpus luteal cyst. Follow-up exam to demonstrate resolution suggested. 4. Trace free pelvic fluid.  Electronically Signed   By: Marcello Moores  Register   On: 06/24/2016 10:35     IMPRESSION AND PLAN:   1. Infectious norovirus gastroenteritis. Patient with nausea vomiting diarrhea and abdominal pain. Supportive care with IV fluids. When necessary pain medication and nausea medication. Full liquid diet for now. 2. [redacted] weeks pregnant. Patient sees Dr. Leafy Ro as outpatient and requesting consultation. Patient has a small subchorionic hemorrhage seen on ultrasound. 3. Hyponatremia. IV fluid hydration with normal saline 4. History of gastric sleeve.   All the records are reviewed and case discussed with ED provider. Management plans discussed with the patient, family and they are in agreement.  CODE STATUS: Full code  TOTAL TIME TAKING CARE OF THIS PATIENT: 45 minutes.    Loletha Grayer M.D on 06/24/2016 at 3:15 PM  Between 7am to 6pm - Pager - [phone]  After 6pm call admission pager [phone]  [facility] Office  [phone]  CC: Primary care physician; Marinda Elk, MD

## 2016-06-25 LAB — MAGNESIUM: Magnesium: 1.5 mg/dL — ABNORMAL LOW (ref 1.7–2.4)

## 2016-06-25 LAB — BASIC METABOLIC PANEL
Anion gap: 3 — ABNORMAL LOW (ref 5–15)
BUN: 5 mg/dL — AB (ref 6–20)
CO2: 23 mmol/L (ref 22–32)
CREATININE: 0.64 mg/dL (ref 0.44–1.00)
Calcium: 7.9 mg/dL — ABNORMAL LOW (ref 8.9–10.3)
Chloride: 110 mmol/L (ref 101–111)
Glucose, Bld: 100 mg/dL — ABNORMAL HIGH (ref 65–99)
Potassium: 3.2 mmol/L — ABNORMAL LOW (ref 3.5–5.1)
SODIUM: 136 mmol/L (ref 135–145)

## 2016-06-25 LAB — CBC
HEMATOCRIT: 35.8 % (ref 35.0–47.0)
HEMOGLOBIN: 12.3 g/dL (ref 12.0–16.0)
MCH: 30.1 pg (ref 26.0–34.0)
MCHC: 34.5 g/dL (ref 32.0–36.0)
MCV: 87.3 fL (ref 80.0–100.0)
Platelets: 209 10*3/uL (ref 150–440)
RBC: 4.1 MIL/uL (ref 3.80–5.20)
RDW: 12.8 % (ref 11.5–14.5)
WBC: 3.9 10*3/uL (ref 3.6–11.0)

## 2016-06-25 MED ORDER — SODIUM CHLORIDE 0.9 % IV BOLUS (SEPSIS): 1000.0000 mL | INTRAVENOUS | Status: DC | PRN

## 2016-06-25 MED ORDER — MAGNESIUM SULFATE 4 GM/100ML IV SOLN
4.0000 g | Freq: Once | INTRAVENOUS | Status: AC
  Administered 2016-06-25: 4 g via INTRAVENOUS
  Filled 2016-06-25: qty 100

## 2016-06-25 MED ORDER — CALCIUM CITRATE 950 (200 CA) MG PO TABS
2.0000 | ORAL_TABLET | Freq: Three times a day (TID) | ORAL | Status: DC
  Administered 2016-06-25 (×2): 400 mg via ORAL
  Filled 2016-06-25 (×4): qty 2

## 2016-06-25 MED ORDER — SODIUM CHLORIDE 0.9 % IV BOLUS (SEPSIS)
1000.0000 mL | Freq: Once | INTRAVENOUS | Status: AC
  Administered 2016-06-25: 09:00:00 1000 mL via INTRAVENOUS

## 2016-06-25 MED ORDER — POTASSIUM CHLORIDE IN NACL 20-0.9 MEQ/L-% IV SOLN
INTRAVENOUS | Status: DC
  Administered 2016-06-25: 11:00:00 via INTRAVENOUS
  Administered 2016-06-25: 20:00:00 1000 mL via INTRAVENOUS
  Administered 2016-06-26: 04:00:00 via INTRAVENOUS
  Filled 2016-06-25 (×6): qty 1000

## 2016-06-25 MED ORDER — B COMPLEX-C PO TABS
1.0000 | ORAL_TABLET | Freq: Every day | ORAL | Status: DC
  Administered 2016-06-25: 1 via ORAL
  Filled 2016-06-25 (×2): qty 1

## 2016-06-25 MED ORDER — CALCIUM CITRATE 950 (200 CA) MG PO TABS: 2.5000 | ORAL_TABLET | Freq: Three times a day (TID) | ORAL | Status: DC

## 2016-06-25 NOTE — Progress Notes (Signed)
[facility] [place] NAME: [name]    MR#:  [id number]  DATE OF BIRTH:  1980/05/25  SUBJECTIVE:  CHIEF COMPLAINT:   Chief Complaint  Patient presents with  . Emesis  Patient is a 36 year old African-American male with past medical history significant for history of gastric sleeve procedure in 2015, who presents to the hospital with abdominal pain, sweating, nausea, vomiting and diarrhea, dizziness, body aches, weakness. She was noted to have [redacted] week pregnant . The patient was hypotensive on arrival to the hospital, requiring IV fluid boluses, remains hypotensive despite IV fluid administration. Revealed hyponatremia, hypokalemia, stool cultures showed not a virus. Patient was admitted to the hospital. Continues to have nausea, but not as pronounced as it was before, able to drink some fluids. Continues to have diarrheal stool.  Review of Systems  Constitutional: Negative for chills, fever and weight loss.  HENT: Negative for congestion.   Eyes: Negative for blurred vision and double vision.  Respiratory: Negative for cough, sputum production, shortness of breath and wheezing.   Cardiovascular: Negative for chest pain, palpitations, orthopnea, leg swelling and PND.  Gastrointestinal: Positive for abdominal pain, diarrhea, nausea and vomiting. Negative for blood in stool and constipation.  Genitourinary: Negative for dysuria, frequency, hematuria and urgency.  Musculoskeletal: Negative for falls.  Neurological: Negative for dizziness, tremors, focal weakness and headaches.  Endo/Heme/Allergies: Does not bruise/bleed easily.  Psychiatric/Behavioral: Negative for depression. The patient does not have insomnia.     VITAL SIGNS: Blood pressure (!) 99/52, pulse 73, temperature 98.6 F (37 C), resp. rate 18, height 5\' 10"  (1.778 m), weight 76.2 kg (167 lb 14.4 oz), last menstrual period 05/10/2016, SpO2 100 %.  PHYSICAL EXAMINATION:     GENERAL:  36 y.o.-year-old patient walking in the room in the mild to moderate distress, due to abdominal pain, nausea, recent diarrhea. Sheats are soiled.  EYES: Pupils equal, round, reactive to light and accommodation. No scleral icterus. Extraocular muscles intact.  HEENT: Head atraumatic, normocephalic. Oropharynx and nasopharynx clear.  NECK:  Supple, no jugular venous distention. No thyroid enlargement, no tenderness.  LUNGS: Normal breath sounds bilaterally, no wheezing, rales,rhonchi or crepitation. No use of accessory muscles of respiration.  CARDIOVASCULAR: S1, S2 normal. No murmurs, rubs, or gallops.  ABDOMEN: Soft, mildly tender diffusely, in epigastric area and periumbilically but no rebound or guarding, nondistended. Bowel sounds present. No organomegaly or mass.  EXTREMITIES: No pedal edema, cyanosis, or clubbing.  NEUROLOGIC: Cranial nerves II through XII are intact. Muscle strength 5/5 in all extremities. Sensation intact. Gait not checked.  PSYCHIATRIC: The patient is alert and oriented x 3.  SKIN: No obvious rash, lesion, or ulcer.   ORDERS/RESULTS REVIEWED:   CBC  Recent Labs Lab 06/24/16 0824 06/25/16 0530  WBC 7.8 3.9  HGB 14.1 12.3  HCT 40.8 35.8  PLT 236 209  MCV 86.9 87.3  MCH 30.0 30.1  MCHC 34.6 34.5  RDW 12.9 12.8   ------------------------------------------------------------------------------------------------------------------  Chemistries   Recent Labs Lab 06/24/16 0824 06/25/16 0530  NA 134* 136  K 3.9 3.2*  CL 107 110  CO2 22 23  GLUCOSE 98 100*  BUN 12 5*  CREATININE 0.83 0.64  CALCIUM 9.0 7.9*  MG  --  1.5*  AST 18  --   ALT 16  --   ALKPHOS 40  --   BILITOT 0.8  --    ------------------------------------------------------------------------------------------------------------------ estimated creatinine clearance is 105.1 mL/min (by C-G formula based  on SCr of 0.64  mg/dL). ------------------------------------------------------------------------------------------------------------------ No results for input(s): TSH, T4TOTAL, T3FREE, THYROIDAB in the last 72 hours.  Invalid input(s): FREET3  Cardiac Enzymes No results for input(s): CKMB, TROPONINI, MYOGLOBIN in the last 168 hours.  Invalid input(s): CK ------------------------------------------------------------------------------------------------------------------ Invalid input(s): POCBNP ---------------------------------------------------------------------------------------------------------------  RADIOLOGY: US Ob Comp Less 14 Wks  Result Date: 06/24/2016 CLINICAL DATA:  Nausea.  Abdominal pain.  Diarrhea. EXAM: OBSTETRIC <14 WK [place] AND TRANSVAGINAL OB US TECHNIQUE: Both transabdominal and transvaginal ultrasound examinations were performed for complete evaluation of the gestation as well as the maternal uterus, adnexal regions, and pelvic cul-de-sac. Transvaginal technique was performed to assess early pregnancy. COMPARISON:  Ultrasound 05/14/2015 . FINDINGS: Intrauterine gestational sac: Single Yolk sac:  Present Embryo:  Present Cardiac Activity: Present Heart Rate: 115  bpm CRL:  0.6 cm 6 w   3 d                  [place] EDC: 02/14/2017 Subchorionic hemorrhage:  Small. Maternal uterus/adnexae: 2.8 x 2.2 x 2.0 cm complex cyst left ovary, most likely corpus luteal cyst. Follow-up to demonstrate resolution suggested. Trace amount of free pelvic fluid noted. IMPRESSION: 1. Single viable intrauterine pregnancy at 6 weeks 3 days. Fetal cardiac activity noted with a fetal heart rate of 115 beats per minute. 2.  Small subchorionic hemorrhage. 3. 2.8 cm complex cyst left ovary, most likely corpus luteal cyst. Follow-up exam to demonstrate resolution suggested. 4. Trace free pelvic fluid. Electronically Signed   By: Marcello Moores  Register   On: 06/24/2016 10:35   US Ob Transvaginal  Result Date: 06/24/2016 CLINICAL DATA:   Nausea.  Abdominal pain.  Diarrhea. EXAM: OBSTETRIC <14 WK [place] AND TRANSVAGINAL OB US TECHNIQUE: Both transabdominal and transvaginal ultrasound examinations were performed for complete evaluation of the gestation as well as the maternal uterus, adnexal regions, and pelvic cul-de-sac. Transvaginal technique was performed to assess early pregnancy. COMPARISON:  Ultrasound 05/14/2015 . FINDINGS: Intrauterine gestational sac: Single Yolk sac:  Present Embryo:  Present Cardiac Activity: Present Heart Rate: 115  bpm CRL:  0.6 cm 6 w   3 d                  [place] EDC: 02/14/2017 Subchorionic hemorrhage:  Small. Maternal uterus/adnexae: 2.8 x 2.2 x 2.0 cm complex cyst left ovary, most likely corpus luteal cyst. Follow-up to demonstrate resolution suggested. Trace amount of free pelvic fluid noted. IMPRESSION: 1. Single viable intrauterine pregnancy at 6 weeks 3 days. Fetal cardiac activity noted with a fetal heart rate of 115 beats per minute. 2.  Small subchorionic hemorrhage. 3. 2.8 cm complex cyst left ovary, most likely corpus luteal cyst. Follow-up exam to demonstrate resolution suggested. 4. Trace free pelvic fluid. Electronically Signed   By: Marcello Moores  Register   On: 06/24/2016 10:35    EKG:  Orders placed or performed during the hospital encounter of 03/15/15  . ED EKG  . ED EKG  . EKG 12-Lead  . EKG 12-Lead  . EKG    ASSESSMENT AND PLAN:  Active Problems:   Norovirus  #1. Acute viral gastroenteritis, continue supportive therapy, IV fluids, clear liquid diet, follow clinically, advance diet as tolerated #2. Hypotension, continue IV fluid boluses and advanced IV fluid rate #3. Hyponatremia, resolved #4. Hypokalemia, supplement intravenously, magnesium level was found to be low, supplemented intravenously as well, recheck in the morning #5 intrauterine pregnancy 6 weeks, patient is to continue vitamin supplementation, follow-up with OB/GYN as outpatient, supportive care  Management plans discussed  with the patient, family and they are in agreement.   DRUG ALLERGIES:  Allergies  Allergen Reactions  . Penicillins Anaphylaxis  . Clonazepam     Indifference to life attitude  . Dexamethasone Hives  . Doxycycline Nausea And Vomiting    Severe vomiting  . Fluoxetine Other (See Comments)    Suicidal thoughts  . Nsaids     No nsaids due to gastric surgery  . Other     CHLORAHEXADINE  . Hydrocodone-Acetaminophen Hives and Rash  . Tape Rash  . Tuberculin Ppd Rash    CODE STATUS:     Code Status Orders        Start     Ordered   06/24/16 1512  Full code  Continuous     06/24/16 1512    Code Status History    Date Active Date Inactive Code Status Order ID Comments User Context   This patient has a current code status but no historical code status.      TOTAL TIME TAKING CARE OF THIS PATIENT: 40 minutes.    Theodoro Grist M.D on 06/25/2016 at 3:58 PM  Between 7am to 6pm - Pager - [phone]  After 6pm go to [email or web address] - password [email or web address] [facility]  [facility]  Office  [phone]  CC: Primary care physician; Marinda Elk, MD

## 2016-06-26 LAB — BASIC METABOLIC PANEL
Anion gap: 5 (ref 5–15)
BUN: <5 mg/dL — ABNORMAL LOW (ref 6–20)
CHLORIDE: 110 mmol/L (ref 101–111)
CO2: 20 mmol/L — ABNORMAL LOW (ref 22–32)
Calcium: 8.3 mg/dL — ABNORMAL LOW (ref 8.9–10.3)
Creatinine, Ser: 0.5 mg/dL (ref 0.44–1.00)
Glucose, Bld: 144 mg/dL — ABNORMAL HIGH (ref 65–99)
POTASSIUM: 3.1 mmol/L — AB (ref 3.5–5.1)
SODIUM: 135 mmol/L (ref 135–145)

## 2016-06-26 LAB — GLUCOSE, CAPILLARY: GLUCOSE-CAPILLARY: 82 mg/dL (ref 65–99)

## 2016-06-26 LAB — MAGNESIUM: MAGNESIUM: 1.7 mg/dL (ref 1.7–2.4)

## 2016-06-26 MED ORDER — POTASSIUM CHLORIDE CRYS ER 20 MEQ PO TBCR
40.0000 meq | EXTENDED_RELEASE_TABLET | Freq: Once | ORAL | Status: AC
  Administered 2016-06-26: 09:00:00 40 meq via ORAL
  Filled 2016-06-26: qty 2

## 2016-06-26 NOTE — Progress Notes (Signed)
Discharge instructions along with home medications and follow up gone over with patient. She verbalizes that she understood instructions. No prescriptions given to patient. IV removed. Pt being discharged home on room air, no distress noted. Ammie Dalton, RN

## 2016-06-26 NOTE — Discharge Summary (Signed)
[facility] at [place] NAME: [name]    MR#:  [id number]  DATE OF BIRTH:  August 27, 1980  DATE OF ADMISSION:  06/24/2016 ADMITTING PHYSICIAN: Loletha Grayer, MD  DATE OF DISCHARGE: 06/26/2016  PRIMARY CARE PHYSICIAN: Marinda Elk, MD    ADMISSION DIAGNOSIS:  First trimester pregnancy [Z34.90] Norovirus [A08.11] Left ovarian cyst [N83.202] Nausea vomiting and diarrhea [R11.2, R19.7] Intractable diarrhea [R19.7] Intractable vomiting with nausea, unspecified vomiting type [R11.2]  DISCHARGE DIAGNOSIS:  Active Problems:   Norovirus   SECONDARY DIAGNOSIS:   Past Medical History:  Diagnosis Date  . Asthma   . Depression   . Fibromyalgia   . Migraines     HOSPITAL COURSE:   36 year old male who is [redacted] weeks pregnant presented with nausea, vomiting diarrhea and found abnormal virus.  1. Acute gastroenteritis due to nor virus: Patient was treated symptomatically with IV fluids. Patient's symptoms have improved.  2. Hypokalemia due to gastroenteritis: Potassium has been repleted.  3. Pregnancy: Patient will follow up with OB/GYN  4. Hyponatremia in the setting of gastroenteritis which is improved  5. Hypotension: Patient's blood pressure has improved with IV fluids. DISCHARGE CONDITIONS AND DIET:  Stable Regular diet  CONSULTS OBTAINED:  Treatment Team:  Benjaman Kindler, MD  DRUG ALLERGIES:   Allergies  Allergen Reactions  . Penicillins Anaphylaxis  . Clonazepam     Indifference to life attitude  . Dexamethasone Hives  . Doxycycline Nausea And Vomiting    Severe vomiting  . Fluoxetine Other (See Comments)    Suicidal thoughts  . Nsaids     No nsaids due to gastric surgery  . Other     CHLORAHEXADINE  . Hydrocodone-Acetaminophen Hives and Rash  . Tape Rash  . Tuberculin Ppd Rash    DISCHARGE MEDICATIONS:   Current Discharge Medication List    CONTINUE these medications which have NOT CHANGED    Details  B Complex-C (SUPER B COMPLEX/VITAMIN C PO) Take 1 tablet by mouth daily.     calcium gluconate 500 MG tablet Take 1 tablet by mouth 3 (three) times daily.    cholecalciferol (VITAMIN D) 1000 units tablet Take 1,000 Units by mouth daily.    Prenatal Vit-Fe Fumarate-FA (PRENATAL MULTIVITAMIN) TABS tablet Take 1 tablet by mouth daily at 12 noon.          Today   CHIEF COMPLAINT:  Doing well only 1 BM yesterday No nausea and tolerating diet   VITAL SIGNS:  Blood pressure 108/60, pulse 72, temperature 98.4 F (36.9 C), temperature source Oral, resp. rate 18, height 5\' 10"  (1.778 m), weight 76.8 kg (169 lb 6.4 oz), last menstrual period 05/10/2016, SpO2 100 %.   REVIEW OF SYSTEMS:  Review of Systems  Constitutional: Negative.  Negative for chills, fever and malaise/fatigue.  HENT: Negative.  Negative for ear discharge, ear pain, hearing loss, nosebleeds and sore throat.   Eyes: Negative.  Negative for blurred vision and pain.  Respiratory: Negative.  Negative for cough, hemoptysis, shortness of breath and wheezing.   Cardiovascular: Negative.  Negative for chest pain, palpitations and leg swelling.  Gastrointestinal: Negative.  Negative for abdominal pain, blood in stool, diarrhea, nausea and vomiting.  Genitourinary: Negative.  Negative for dysuria.  Musculoskeletal: Negative.  Negative for back pain.  Skin: Negative.   Neurological: Negative for dizziness, tremors, speech change, focal weakness, seizures and headaches.  Endo/Heme/Allergies: Negative.  Does not bruise/bleed easily.  Psychiatric/Behavioral: Negative.  Negative for depression, hallucinations and  suicidal ideas.     PHYSICAL EXAMINATION:  GENERAL:  36 y.o.-year-old patient lying in the bed with no acute distress.  NECK:  Supple, no jugular venous distention. No thyroid enlargement, no tenderness.  LUNGS: Normal breath sounds bilaterally, no wheezing, rales,rhonchi  No use of accessory muscles of  respiration.  CARDIOVASCULAR: S1, S2 normal. No murmurs, rubs, or gallops.  ABDOMEN: Soft, non-tender, non-distended. Bowel sounds present. No organomegaly or mass.  EXTREMITIES: No pedal edema, cyanosis, or clubbing.  PSYCHIATRIC: The patient is alert and oriented x 3.  SKIN: No obvious rash, lesion, or ulcer.   DATA REVIEW:   CBC  Recent Labs Lab 06/25/16 0530  WBC 3.9  HGB 12.3  HCT 35.8  PLT 209    Chemistries   Recent Labs Lab 06/24/16 0824  06/26/16 0511  NA 134*  < > 135  K 3.9  < > 3.1*  CL 107  < > 110  CO2 22  < > 20*  GLUCOSE 98  < > 144*  BUN 12  < > <5*  CREATININE 0.83  < > 0.50  CALCIUM 9.0  < > 8.3*  MG  --   < > 1.7  AST 18  --   --   ALT 16  --   --   ALKPHOS 40  --   --   BILITOT 0.8  --   --   < > = values in this interval not displayed.  Cardiac Enzymes No results for input(s): TROPONINI in the last 168 hours.  Microbiology Results  @[email or web address] @  RADIOLOGY:  US Ob Comp Less 14 Wks  Result Date: 06/24/2016 CLINICAL DATA:  Nausea.  Abdominal pain.  Diarrhea. EXAM: OBSTETRIC <14 WK [place] AND TRANSVAGINAL OB US TECHNIQUE: Both transabdominal and transvaginal ultrasound examinations were performed for complete evaluation of the gestation as well as the maternal uterus, adnexal regions, and pelvic cul-de-sac. Transvaginal technique was performed to assess early pregnancy. COMPARISON:  Ultrasound 05/14/2015 . FINDINGS: Intrauterine gestational sac: Single Yolk sac:  Present Embryo:  Present Cardiac Activity: Present Heart Rate: 115  bpm CRL:  0.6 cm 6 w   3 d                  [place] EDC: 02/14/2017 Subchorionic hemorrhage:  Small. Maternal uterus/adnexae: 2.8 x 2.2 x 2.0 cm complex cyst left ovary, most likely corpus luteal cyst. Follow-up to demonstrate resolution suggested. Trace amount of free pelvic fluid noted. IMPRESSION: 1. Single viable intrauterine pregnancy at 6 weeks 3 days. Fetal cardiac activity noted with a fetal heart rate of 115 beats per  minute. 2.  Small subchorionic hemorrhage. 3. 2.8 cm complex cyst left ovary, most likely corpus luteal cyst. Follow-up exam to demonstrate resolution suggested. 4. Trace free pelvic fluid. Electronically Signed   By: Marcello Moores  Register   On: 06/24/2016 10:35   US Ob Transvaginal  Result Date: 06/24/2016 CLINICAL DATA:  Nausea.  Abdominal pain.  Diarrhea. EXAM: OBSTETRIC <14 WK [place] AND TRANSVAGINAL OB US TECHNIQUE: Both transabdominal and transvaginal ultrasound examinations were performed for complete evaluation of the gestation as well as the maternal uterus, adnexal regions, and pelvic cul-de-sac. Transvaginal technique was performed to assess early pregnancy. COMPARISON:  Ultrasound 05/14/2015 . FINDINGS: Intrauterine gestational sac: Single Yolk sac:  Present Embryo:  Present Cardiac Activity: Present Heart Rate: 115  bpm CRL:  0.6 cm 6 w   3 d  [place] EDC: 02/14/2017 Subchorionic hemorrhage:  Small. Maternal uterus/adnexae: 2.8 x 2.2 x 2.0 cm complex cyst left ovary, most likely corpus luteal cyst. Follow-up to demonstrate resolution suggested. Trace amount of free pelvic fluid noted. IMPRESSION: 1. Single viable intrauterine pregnancy at 6 weeks 3 days. Fetal cardiac activity noted with a fetal heart rate of 115 beats per minute. 2.  Small subchorionic hemorrhage. 3. 2.8 cm complex cyst left ovary, most likely corpus luteal cyst. Follow-up exam to demonstrate resolution suggested. 4. Trace free pelvic fluid. Electronically Signed   By: Marcello Moores  Register   On: 06/24/2016 10:35      Current Discharge Medication List    CONTINUE these medications which have NOT CHANGED   Details  B Complex-C (SUPER B COMPLEX/VITAMIN C PO) Take 1 tablet by mouth daily.     calcium gluconate 500 MG tablet Take 1 tablet by mouth 3 (three) times daily.    cholecalciferol (VITAMIN D) 1000 units tablet Take 1,000 Units by mouth daily.    Prenatal Vit-Fe Fumarate-FA (PRENATAL MULTIVITAMIN) TABS tablet Take 1  tablet by mouth daily at 12 noon.         A  Management plans discussed with the patient and she is in agreement. Stable for discharge home  Patient should follow up with pcp  CODE STATUS:     Code Status Orders        Start     Ordered   06/24/16 1512  Full code  Continuous     06/24/16 1512    Code Status History    Date Active Date Inactive Code Status Order ID Comments User Context   This patient has a current code status but no historical code status.      TOTAL TIME TAKING CARE OF THIS PATIENT: 36 minutes.    Note: This dictation was prepared with Dragon dictation along with smaller phrase technology. Any transcriptional errors that result from this process are unintentional.  Johanna Matto M.D on 06/26/2016 at 8:45 AM  Between 7am to 6pm - Pager - [phone] After 6pm go to [email or web address] - password [email or web address] [facility]  Office  [phone]  CC: Primary care physician; Marinda Elk, MD

## 2016-06-26 NOTE — Progress Notes (Signed)
[facility] at [place] was admitted to the Hospital on 06/24/2016 and Discharged  06/26/2016 and should be excused from work/school   for 7  days starting 06/24/2016 , may return to work/school without any restrictions.  Call Bettey Costa MD with questions.  Grainger Mccarley M.D on 06/26/2016,at 8:44 AM  [place] [facility]  [phone]

## 2016-07-29 LAB — OB RESULTS CONSOLE RUBELLA ANTIBODY, IGM: RUBELLA: IMMUNE

## 2016-07-30 ENCOUNTER — Other Ambulatory Visit: Payer: Self-pay | Admitting: Obstetrics and Gynecology

## 2016-07-30 DIAGNOSIS — Z369 Encounter for antenatal screening, unspecified: Secondary | ICD-10-CM

## 2016-07-30 LAB — OB RESULTS CONSOLE VARICELLA ZOSTER ANTIBODY, IGG: Varicella: IMMUNE

## 2016-08-02 ENCOUNTER — Emergency Department: Payer: BC Managed Care – PPO

## 2016-08-02 ENCOUNTER — Encounter: Payer: Self-pay | Admitting: Emergency Medicine

## 2016-08-02 ENCOUNTER — Emergency Department
Admission: EM | Admit: 2016-08-02 | Discharge: 2016-08-02 | Disposition: A | Discharge status: Home / Self Care | Payer: BC Managed Care – PPO | Attending: Emergency Medicine | Admitting: Emergency Medicine

## 2016-08-02 DIAGNOSIS — K429 Umbilical hernia without obstruction or gangrene: Secondary | ICD-10-CM | POA: Insufficient documentation

## 2016-08-02 DIAGNOSIS — L729 Follicular cyst of the skin and subcutaneous tissue, unspecified: Secondary | ICD-10-CM | POA: Diagnosis not present

## 2016-08-02 DIAGNOSIS — J45909 Unspecified asthma, uncomplicated: Secondary | ICD-10-CM | POA: Diagnosis not present

## 2016-08-02 DIAGNOSIS — Z87891 Personal history of nicotine dependence: Secondary | ICD-10-CM | POA: Insufficient documentation

## 2016-08-02 DIAGNOSIS — Z3A12 12 weeks gestation of pregnancy: Secondary | ICD-10-CM | POA: Insufficient documentation

## 2016-08-02 DIAGNOSIS — R1033 Periumbilical pain: Secondary | ICD-10-CM | POA: Diagnosis present

## 2016-08-02 DIAGNOSIS — Z315 Encounter for genetic counseling: Secondary | ICD-10-CM | POA: Insufficient documentation

## 2016-08-02 LAB — COMPREHENSIVE METABOLIC PANEL
ALBUMIN: 3.8 g/dL (ref 3.5–5.0)
ALK PHOS: 52 U/L (ref 38–126)
ALT: 12 U/L — ABNORMAL LOW (ref 14–54)
ANION GAP: 6 (ref 5–15)
AST: 17 U/L (ref 15–41)
BILIRUBIN TOTAL: 0.3 mg/dL (ref 0.3–1.2)
BUN: 8 mg/dL (ref 6–20)
CALCIUM: 8.7 mg/dL — AB (ref 8.9–10.3)
CO2: 27 mmol/L (ref 22–32)
Chloride: 99 mmol/L — ABNORMAL LOW (ref 101–111)
Creatinine, Ser: 0.62 mg/dL (ref 0.44–1.00)
GFR calc non Af Amer: >60 mL/min (ref >60)
GLUCOSE: 112 mg/dL — AB (ref 65–99)
POTASSIUM: 3.6 mmol/L (ref 3.5–5.1)
Sodium: 132 mmol/L — ABNORMAL LOW (ref 135–145)
TOTAL PROTEIN: 7.5 g/dL (ref 6.5–8.1)

## 2016-08-02 LAB — CBC WITH DIFFERENTIAL/PLATELET
Basophils Absolute: 0 10*3/uL (ref 0–0.1)
Basophils Relative: 0 %
EOS ABS: 0.1 10*3/uL (ref 0–0.7)
Eosinophils Relative: 2 %
HEMATOCRIT: 38.4 % (ref 35.0–47.0)
HEMOGLOBIN: 13.2 g/dL (ref 12.0–16.0)
LYMPHS ABS: 1.4 10*3/uL (ref 1.0–3.6)
Lymphocytes Relative: 20 %
MCH: 30.4 pg (ref 26.0–34.0)
MCHC: 34.4 g/dL (ref 32.0–36.0)
MCV: 88.2 fL (ref 80.0–100.0)
MONO ABS: 0.8 10*3/uL (ref 0.2–0.9)
MONOS PCT: 12 %
NEUTROS ABS: 4.5 10*3/uL (ref 1.4–6.5)
NEUTROS PCT: 66 %
Platelets: 239 10*3/uL (ref 150–440)
RBC: 4.35 MIL/uL (ref 3.80–5.20)
RDW: 13.5 % (ref 11.5–14.5)
WBC: 6.8 10*3/uL (ref 3.6–11.0)

## 2016-08-02 LAB — ABO/RH: ABO/RH(D): O POS

## 2016-08-02 LAB — HCG, QUANTITATIVE, PREGNANCY: HCG, BETA CHAIN, QUANT, S: 83450 m[IU]/mL — AB (ref <5)

## 2016-08-02 NOTE — ED Provider Notes (Signed)
[facility] Emergency Department Provider Note   ____________________________________________    I have reviewed the triage vital signs and the nursing notes.   HISTORY  Chief Complaint Abdominal Pain     HPI [name] is a 36 y.o. male who presents with pain from a hernia. Patient reports she first noticed a supraumbilical hernia approximately 2 weeks ago, typically would protrude in the evening but be gone in the morning. However the last several days it has been much more painful constantly. She saw her OB/GYN who sent her to the emergency department. She denies nausea or vomiting. Reports normal stools. Has a history of hernias including 2 inguinal hernias.   Past Medical History:  Diagnosis Date  . Asthma   . Depression   . Fibromyalgia   . Migraines     Patient Active Problem List   Diagnosis Date Noted  . Umbilical hernia without obstruction and without gangrene   . Norovirus 06/24/2016  . ADHD (attention deficit hyperactivity disorder) 06/20/2016  . Anxiety 06/20/2016  . Asthma without status asthmaticus 06/20/2016  . Depression 06/20/2016  . Fibrocystic breast disease 06/20/2016  . Fibromyalgia 06/20/2016  . Migraine 06/20/2016  . Ovarian cyst 06/20/2016  . Morbid obesity ([place]) 02/24/2014  . Headache 07/26/2013  . Pre-syncope 07/26/2013  . KNEE PAIN, BILATERAL 01/02/2010  . TENDINITIS, PATELLAR 01/02/2010    Past Surgical History:  Procedure Laterality Date  . BUNIONECTOMY Bilateral   . EYE SURGERY     blood clot in eye  . HERNIA REPAIR  1991, 2001   inguinal and umbilical  . LAPAROSCOPIC GASTRIC SLEEVE RESECTION  2015    Prior to Admission medications   Medication Sig Start Date End Date Taking? Authorizing Provider  B Complex-C (SUPER B COMPLEX/VITAMIN C PO) Take 1 tablet by mouth daily.     [provider]  calcium gluconate 500 MG tablet Take 1 tablet by mouth 3 (three) times daily.    [provider]  cholecalciferol (VITAMIN D) 1000 units tablet Take 1,000 Units by mouth daily.    [provider]  Prenatal Vit-Fe Fumarate-FA (PRENATAL MULTIVITAMIN) TABS tablet Take 1 tablet by mouth daily at 12 noon.    [provider]     Allergies Penicillins; Clonazepam; Dexamethasone; Doxycycline; Fluoxetine; Nsaids; Other; Hydrocodone-acetaminophen; Tape; and Tuberculin ppd  Family History  Problem Relation Age of Onset  . Breast cancer Paternal Aunt   . Breast cancer Paternal Aunt   . Healthy Mother   . CVA Father   . Hypertension Father   . Diabetes Father     Social History Social History  Substance Use Topics  . Smoking status: Former Smoker    Quit date: 06/21/2003  . Smokeless tobacco: Never Used  . Alcohol use No    Review of Systems  Constitutional: No fever/chills Eyes: No visual changes.  ENT: No sore throat. Cardiovascular: Denies chest pain. Respiratory: Denies shortness of breath. Gastrointestinal:   No nausea, no vomiting.   Genitourinary: Negative for dysuria. Musculoskeletal: Negative for back pain. Skin: Negative for rash. Neurological: Negative for headaches or weakness   ____________________________________________   PHYSICAL EXAM:  VITAL SIGNS: ED Triage Vitals  Enc Vitals Group     BP 08/02/16 1519 108/62     Pulse Rate 08/02/16 1519 88     Resp 08/02/16 1519 20     Temp 08/02/16 1519 98.3 F (36.8 C)     Temp Source 08/02/16 1519 Oral  SpO2 08/02/16 1519 98 %     Weight 08/02/16 1520 79.4 kg (175 lb)     Height 08/02/16 1520 1.778 m (5\' 10" )     Head Circumference --      Peak Flow --      Pain Score --      Pain Loc --      Pain Edu? --      Excl. in [place]? --     Constitutional: Alert and oriented. No acute distress. Pleasant and interactive Eyes: Conjunctivae are normal.  Head: Atraumatic. Nose: No congestion/rhinnorhea. Mouth/Throat: Mucous membranes are moist.   Neck:  Painless  ROM Cardiovascular: Normal rate, regular rhythm. Grossly normal heart sounds.  Good peripheral circulation. Respiratory: Normal respiratory effort.  No retractions. Lungs CTAB. Gastrointestinal: Small 1x2 cm firm nodule/?hernia, unable to reduce, just above the umbilicus. Soft and nontender. No distention.  No CVA tenderness. Genitourinary: deferred Musculoskeletal: No lower extremity tenderness nor edema.  Warm and well perfused Neurologic:  Normal speech and language. No gross focal neurologic deficits are appreciated.  Skin:  Skin is warm, dry and intact. No rash noted. Psychiatric: Mood and affect are normal. Speech and behavior are normal.  ____________________________________________   LABS (all labs ordered are listed, but only abnormal results are displayed)  Labs Reviewed  COMPREHENSIVE METABOLIC PANEL - Abnormal; Notable for the following:       Result Value   Sodium 132 (*)    Chloride 99 (*)    Glucose, Bld 112 (*)    Calcium 8.7 (*)    ALT 12 (*)    All other components within normal limits  HCG, QUANTITATIVE, PREGNANCY - Abnormal; Notable for the following:    hCG, Beta Chain, Quant, S 83,450 (*)    All other components within normal limits  CBC WITH DIFFERENTIAL/PLATELET  ABO/RH   ____________________________________________  EKG  None ____________________________________________  RADIOLOGY  Ultrasound demonstrates subcutaneous cyst ____________________________________________   PROCEDURES  Procedure(s) performed: No    Critical Care performed: No ____________________________________________   INITIAL IMPRESSION / ASSESSMENT AND PLAN / ED COURSE  Pertinent labs & imaging results that were available during my care of the patient were reviewed by me and considered in my medical decision making (see chart for details).  Ultrasound demonstrates subcutaneous cyst, seen by Dr. Adonis Huguenin of surgery, no further treatment needed at this time. Appropriate  for D/c with outpatient follow up    ____________________________________________   FINAL CLINICAL IMPRESSION(S) / ED DIAGNOSES  Final diagnoses:  Subcutaneous cyst      NEW MEDICATIONS STARTED DURING THIS VISIT:  New Prescriptions   No medications on file     Note:  This document was prepared using Dragon voice recognition software and may include unintentional dictation errors.    Lavonia Drafts, MD 08/02/16 [phone]

## 2016-08-02 NOTE — ED Triage Notes (Addendum)
Pt c/o mid abdominal pain around umbilical area.  Has had pain for 2 weeks. Pt is [redacted] weeks pregnant and went to [facility] for pain.  Reports feel a knot when sitting up. No pain unless touching.  Has had hernia before but they did not hurt and she reports it will not go in when push it.  OB spoke to dr Tamala Julian and her OB told her to come to ED because it could be fatal (per pt) and dr Tamala Julian is aware she will be in ED. Pt appears well, NAD. Abdomen soft. Small knot palpable when pt sitting around umbilical area.

## 2016-08-02 NOTE — Consult Note (Signed)
Patient ID: [name], male   DOB: Oct 15, 1980, 36 y.o.   MRN: [id number]  CC: bulge above umbilicus.  HPI [name] is a 36 y.o. male who was sent to the emergency department by her OB/GYN for evaluation of essentially incarcerated hernia. Surgery was then consult for further evaluation by Dr. Corky Downs of the ER. Per the patient she is Numerous prior hernias including a prior umbilical hernia that was repaired. Patient reports for the last 2 weeks she has had intermittently protruding and painful areas just above the umbilicus. Over the last several days since been more constantly painful which prompted her to see her OB/GYN. Her OB/GYN sent her to emergency room for further evaluation. She reports that she has not had any fevers, chills, nausea, vomiting, chest pain, short of breath, diarrhea, constipation. She is [redacted] weeks pregnant.  HPI  Past Medical History:  Diagnosis Date  . Asthma   . Depression   . Fibromyalgia   . Migraines     Past Surgical History:  Procedure Laterality Date  . BUNIONECTOMY Bilateral   . EYE SURGERY     blood clot in eye  . HERNIA REPAIR  1991, 2001   inguinal and umbilical  . LAPAROSCOPIC GASTRIC SLEEVE RESECTION  2015    Family History  Problem Relation Age of Onset  . Breast cancer Paternal Aunt   . Breast cancer Paternal Aunt   . Healthy Mother   . CVA Father   . Hypertension Father   . Diabetes Father     Social History Social History  Substance Use Topics  . Smoking status: Former Smoker    Quit date: 06/21/2003  . Smokeless tobacco: Never Used  . Alcohol use No    Allergies  Allergen Reactions  . Penicillins Anaphylaxis  . Clonazepam     Indifference to life attitude  . Dexamethasone Hives  . Doxycycline Nausea And Vomiting    Severe vomiting  . Fluoxetine Other (See Comments)    Suicidal thoughts  . Nsaids     No nsaids due to gastric surgery  . Other     CHLORAHEXADINE  . Hydrocodone-Acetaminophen Hives and Rash  .  Tape Rash  . Tuberculin Ppd Rash    No current facility-administered medications for this encounter.    Current Outpatient Prescriptions  Medication Sig Dispense Refill  . B Complex-C (SUPER B COMPLEX/VITAMIN C PO) Take 1 tablet by mouth daily.     . calcium gluconate 500 MG tablet Take 1 tablet by mouth 3 (three) times daily.    . cholecalciferol (VITAMIN D) 1000 units tablet Take 1,000 Units by mouth daily.    . Prenatal Vit-Fe Fumarate-FA (PRENATAL MULTIVITAMIN) TABS tablet Take 1 tablet by mouth daily at 12 noon.       Review of Systems A Multi-point review of systems was asked and was negative except for the findings documented in the history of present illness   Physical Exam Blood pressure 108/62, pulse 88, temperature 98.3 F (36.8 C), temperature source Oral, resp. rate 20, height 5\' 10"  (1.778 m), weight 79.4 kg (175 lb), last menstrual period 05/10/2016, SpO2 98 %. CONSTITUTIONAL: No acute distress. EYES: Pupils are equal, round, and reactive to light, Sclera are non-icteric. EARS, NOSE, MOUTH AND THROAT: The oropharynx is clear. The oral mucosa is pink and moist. Hearing is intact to voice. LYMPH NODES:  Lymph nodes in the neck are normal. RESPIRATORY:  Lungs are clear. There is normal respiratory effort, with equal breath  sounds bilaterally, and without pathologic use of accessory muscles. CARDIOVASCULAR: Heart is regular without murmurs, gallops, or rubs. GI: The abdomen is soft, mildly tender above the umbilicus with the palpable bulge above the umbilicus, and nondistended. Multiple well-healed prior incision sites. There is no hepatosplenomegaly. There are normal bowel sounds in all quadrants. GU: Rectal deferred.   MUSCULOSKELETAL: Normal muscle strength and tone. No cyanosis or edema.   SKIN: Turgor is good and there are no pathologic skin lesions or ulcers. NEUROLOGIC: Motor and sensation is grossly normal. Cranial nerves are grossly intact. PSYCH:  Oriented to  person, place and time. Affect is normal.  Data Reviewed Ultrasound of the 2 umbilical region shows a simple cyst, no evidence of incarcerated fat or bowel. Labs are all within normal limits. I have personally reviewed the patient's imaging, laboratory findings and medical records.    Assessment    Supraumbilical bulge    Plan    36 year old male who is [redacted] weeks pregnant with a tender supraumbilical bulge. At least partially reducible on my exam, after my attempts at reduction the area decreased in size and the patient reports that her pain has resolved. Discussed the ultrasound findings with the patient, also discussed at length the signs and symptoms of an incarcerated or strangulate hernia. Patient understands that should this be a hernia, hernia repair during pregnancy is usually not undertaken and less bowel was involved. At this point discussed that there is no surgical urgency or intervention warranted. She is to continue to follow up with her OB/GYN and can follow-up with general surgery on an as-needed basis.     Time spent with the patient was 30 minutes, with more than 50% of the time spent in face-to-face education, counseling and care coordination.     Clayburn Pert, MD FACS General Surgeon 08/02/2016, 5:54 PM

## 2016-08-02 NOTE — ED Notes (Signed)
Pt given a few ice chips. Pt in recliner in [place].

## 2016-08-02 NOTE — ED Notes (Signed)
First nurse note   Sent in by [facility]   For possible hernia  She is [redacted] weeks pregnant

## 2016-08-02 NOTE — ED Notes (Signed)
Pt discharged home after verbalizing understanding of discharge instructions; nad noted. 

## 2016-10-22 ENCOUNTER — Other Ambulatory Visit: Payer: Self-pay | Admitting: Obstetrics & Gynecology

## 2016-10-22 DIAGNOSIS — N6310 Unspecified lump in the right breast, unspecified quadrant: Secondary | ICD-10-CM

## 2016-10-29 ENCOUNTER — Ambulatory Visit
Admission: RE | Admit: 2016-10-29 | Discharge: 2016-10-29 | Disposition: A | Discharge status: Home / Self Care | Payer: BC Managed Care – PPO | Source: Ambulatory Visit | Attending: Obstetrics & Gynecology | Admitting: Obstetrics & Gynecology

## 2016-10-29 DIAGNOSIS — N6310 Unspecified lump in the right breast, unspecified quadrant: Secondary | ICD-10-CM

## 2016-10-29 HISTORY — DX: Diffuse cystic mastopathy of unspecified breast: N60.19

## 2016-10-29 HISTORY — DX: Solitary cyst of unspecified breast: N60.09

## 2016-10-29 HISTORY — DX: Unspecified lump in unspecified breast: N63.0

## 2016-10-30 ENCOUNTER — Other Ambulatory Visit: Payer: BC Managed Care – PPO

## 2016-11-01 ENCOUNTER — Other Ambulatory Visit: Payer: Self-pay | Admitting: Obstetrics & Gynecology

## 2016-11-01 DIAGNOSIS — Z3689 Encounter for other specified antenatal screening: Secondary | ICD-10-CM

## 2016-11-06 ENCOUNTER — Observation Stay
Admission: EM | Admit: 2016-11-06 | Discharge: 2016-11-06 | Disposition: A | Discharge status: Home / Self Care | Payer: BC Managed Care – PPO | Attending: Obstetrics and Gynecology | Admitting: Obstetrics and Gynecology

## 2016-11-06 ENCOUNTER — Encounter: Payer: Self-pay | Admitting: Emergency Medicine

## 2016-11-06 DIAGNOSIS — O26892 Other specified pregnancy related conditions, second trimester: Principal | ICD-10-CM | POA: Insufficient documentation

## 2016-11-06 DIAGNOSIS — O219 Vomiting of pregnancy, unspecified: Secondary | ICD-10-CM | POA: Diagnosis present

## 2016-11-06 DIAGNOSIS — Z79899 Other long term (current) drug therapy: Secondary | ICD-10-CM | POA: Insufficient documentation

## 2016-11-06 DIAGNOSIS — Z87891 Personal history of nicotine dependence: Secondary | ICD-10-CM | POA: Diagnosis not present

## 2016-11-06 DIAGNOSIS — R42 Dizziness and giddiness: Secondary | ICD-10-CM | POA: Diagnosis not present

## 2016-11-06 DIAGNOSIS — Z3A25 25 weeks gestation of pregnancy: Secondary | ICD-10-CM | POA: Insufficient documentation

## 2016-11-06 DIAGNOSIS — Z9884 Bariatric surgery status: Secondary | ICD-10-CM | POA: Diagnosis not present

## 2016-11-06 LAB — CBC
HEMATOCRIT: 38.6 % (ref 35.0–47.0)
HEMOGLOBIN: 13.4 g/dL (ref 12.0–16.0)
MCH: 31.4 pg (ref 26.0–34.0)
MCHC: 34.7 g/dL (ref 32.0–36.0)
MCV: 90.5 fL (ref 80.0–100.0)
Platelets: 238 10*3/uL (ref 150–440)
RBC: 4.26 MIL/uL (ref 3.80–5.20)
RDW: 12.5 % (ref 11.5–14.5)
WBC: 9.4 10*3/uL (ref 3.6–11.0)

## 2016-11-06 LAB — BASIC METABOLIC PANEL
Anion gap: 6 (ref 5–15)
BUN: 8 mg/dL (ref 6–20)
CHLORIDE: 99 mmol/L — AB (ref 101–111)
CO2: 26 mmol/L (ref 22–32)
CREATININE: 0.58 mg/dL (ref 0.44–1.00)
Calcium: 8.7 mg/dL — ABNORMAL LOW (ref 8.9–10.3)
Glucose, Bld: 94 mg/dL (ref 65–99)
POTASSIUM: 4 mmol/L (ref 3.5–5.1)
SODIUM: 131 mmol/L — AB (ref 135–145)

## 2016-11-06 LAB — URINALYSIS, COMPLETE (UACMP) WITH MICROSCOPIC
Bilirubin Urine: NEGATIVE
Glucose, UA: 50 mg/dL — AB
Hgb urine dipstick: NEGATIVE
Ketones, ur: NEGATIVE mg/dL
Leukocytes, UA: NEGATIVE
Nitrite: NEGATIVE
PROTEIN: NEGATIVE mg/dL
SPECIFIC GRAVITY, URINE: 1.009 (ref 1.005–1.030)
pH: 5 (ref 5.0–8.0)

## 2016-11-06 MED ORDER — SODIUM CHLORIDE 0.9 % IV BOLUS (SEPSIS)
1000.0000 mL | Freq: Once | INTRAVENOUS | Status: AC
  Administered 2016-11-06: 1000 mL via INTRAVENOUS

## 2016-11-06 NOTE — ED Provider Notes (Addendum)
[facility] Emergency Department Provider Note  ____________________________________________   I have reviewed the triage vital signs and the nursing notes.   HISTORY  Chief Complaint Dizziness    HPI [name] is a 36 y.o. male  G1 P2, history of fibromyalgia, gastric sleeve surgery in 2015, who is [redacted] weeks pregnant, with a known IUP. 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 This is resulted in decreased by mouth intake and more walking around and she was accustomed to over the summer, and today, she was in class and became lightheaded right before lunch. She did not pass out but she felt tingling in all parts of her face and flushed and she went and laid down at the nurse's office. At this time she has no complaints. She had no abdominal pain, vaginal bleeding, rush of fluid from her vagina, she denies any pain, she denies chest pain or shortness of breath, she denies personal or family history of PE or DVT, she has no shortness of breath, she just felt lightheaded and mildly nauseated. She did not vomit       Past Medical History:  Diagnosis Date  . Asthma   . Breast cyst   . Breast mass   . Depression   . Fibrocystic breast   . Fibromyalgia   . Migraines     Patient Active Problem List   Diagnosis Date Noted  . Umbilical hernia without obstruction and without gangrene   . Norovirus 06/24/2016  . ADHD (attention deficit hyperactivity disorder) 06/20/2016  . Anxiety 06/20/2016  . Asthma without status asthmaticus 06/20/2016  . Depression 06/20/2016  . Fibrocystic breast disease 06/20/2016  . Fibromyalgia 06/20/2016  . Migraine 06/20/2016  . Ovarian cyst 06/20/2016  . Morbid obesity ([place]) 02/24/2014  . Headache 07/26/2013  . Pre-syncope 07/26/2013  . KNEE PAIN, BILATERAL 01/02/2010  . TENDINITIS, PATELLAR 01/02/2010    Past Surgical History:  Procedure Laterality Date  .  BUNIONECTOMY Bilateral   . EYE SURGERY     blood clot in eye  . HERNIA REPAIR  1991, 2001   inguinal and umbilical  . LAPAROSCOPIC GASTRIC SLEEVE RESECTION  2015    Prior to Admission medications   Medication Sig Start Date End Date Taking? Authorizing Provider  B Complex-C (SUPER B COMPLEX/VITAMIN C PO) Take 1 tablet by mouth daily.     [provider]  calcium gluconate 500 MG tablet Take 1 tablet by mouth 3 (three) times daily.    [provider]  cholecalciferol (VITAMIN D) 1000 units tablet Take 1,000 Units by mouth daily.    [provider]  Prenatal Vit-Fe Fumarate-FA (PRENATAL MULTIVITAMIN) TABS tablet Take 1 tablet by mouth daily at 12 noon.    [provider]    Allergies Penicillins; Clonazepam; Dexamethasone; Doxycycline; Fluoxetine; Nsaids; Other; Hydrocodone-acetaminophen; Tape; and Tuberculin ppd  Family History  Problem Relation Age of Onset  . Breast cancer Paternal Aunt   . Breast cancer Paternal Aunt   . Healthy Mother   . CVA Father   . Hypertension Father   . Diabetes Father   . Breast cancer Paternal Grandmother     Social History Social History  Substance Use Topics  . Smoking status: Former Smoker    Quit date: 06/21/2003  . Smokeless tobacco: Never Used  . Alcohol use No    Review of Systems Constitutional: No fever/chills Eyes: No visual changes. ENT: No sore throat. No  stiff neck no neck pain Cardiovascular: Denies chest pain. Respiratory: Denies shortness of breath. Gastrointestinal:   no vomiting.  No diarrhea.  No constipation. Genitourinary: Negative for dysuria. Musculoskeletal: Negative lower extremity swelling Skin: Negative for rash. Neurological: Negative for severe headaches, focal weakness or numbness.   ____________________________________________   PHYSICAL EXAM:  VITAL SIGNS: ED Triage Vitals [11/06/16 1203]  Enc Vitals Group     BP 116/76     Pulse Rate 73     Resp 20     Temp  (!) 97.5 F (36.4 C)     Temp Source Oral     SpO2 100 %     Weight 200 lb (90.7 kg)     Height 5\' 10"  (1.778 m)     Head Circumference      Peak Flow      Pain Score      Pain Loc      Pain Edu?      Excl. in [place]?     Constitutional: Alert and oriented. Well appearing and in no acute distress. Eyes: Conjunctivae are normal Head: Atraumatic HEENT: No congestion/rhinnorhea. Mucous membranes are moist.  Oropharynx non-erythematous Neck:   Nontender with no meningismus, no masses, no stridor Cardiovascular: Normal rate, regular rhythm. Grossly normal heart sounds.  Good peripheral circulation. Respiratory: Normal respiratory effort.  No retractions. Lungs CTAB. Abdominal: Soft and nontender. No distention. No guarding no rebound Back:  There is no focal tenderness or step off.  there is no midline tenderness there are no lesions noted. there is no CVA tenderness Musculoskeletal: No lower extremity tenderness, no upper extremity tenderness. No joint effusions, no DVT signs strong distal pulses no edema Neurologic:  Normal speech and language. No gross focal neurologic deficits are appreciated.  Skin:  Skin is warm, dry and intact. No rash noted. Psychiatric: Mood and affect are normal. Speech and behavior are normal.  ____________________________________________   LABS (all labs ordered are listed, but only abnormal results are displayed)  Labs Reviewed  BASIC METABOLIC PANEL - Abnormal; Notable for the following:       Result Value   Sodium 131 (*)    Chloride 99 (*)    Calcium 8.7 (*)    All other components within normal limits  URINALYSIS, COMPLETE (UACMP) WITH MICROSCOPIC - Abnormal; Notable for the following:    Color, Urine YELLOW (*)    APPearance HAZY (*)    Glucose, UA 50 (*)    Bacteria, UA RARE (*)    Squamous Epithelial / LPF 0-5 (*)    All other components within normal limits  CBC   ____________________________________________  EKG  I personally  interpreted any EKGs ordered by me or triage Normal sinus rhythm rate 77 history in no acute ST elevation or acute ST depression normal axis unremarkable EKG ____________________________________________  RADIOLOGY  I reviewed any imaging ordered by me or triage that were performed during my shift and, if possible, patient and/or family made aware of any abnormal findings. ____________________________________________   PROCEDURES  Procedure(s) performed: None  Procedures  Critical Care performed: None  ____________________________________________   INITIAL IMPRESSION / ASSESSMENT AND PLAN / ED COURSE  Pertinent labs & imaging results that were available during my care of the patient were reviewed by me and considered in my medical decision making (see chart for details).  Patient currently well-appearing woman who is in the 25th week of pregnancy, resumed full working duties involving walking around a great deal, and had the sensation  of lightheadedness and tingling and nausea. Completely back to baseline at this time. Very low suspicion for ACS PE or dissection. Blood work is reassuring. Most likely this is over exertion and under hydration in a pregnant patient. No red flags at this time for other significant abnormality. Certainly no evidence of anything involving the pregnancy. We willhowever do a bedside ultrasound to get fetal heart tones and I will give her IV fluid. There is some talk that OB/GYN may wish her upstairs for monitoring which I'm happy to do if that is what is desired.   ----------------------------------------- 2:39 PM on 11/06/2016 -----------------------------------------  D.w. Dr. Angelina Sheriff who agrees w/ Zachery Dakins and wants the pt to go upstairs for monitoring. Pt asymptomatic here.     ____________________________________________   FINAL CLINICAL IMPRESSION(S) / ED DIAGNOSES  Final diagnoses:  None      This chart was dictated using voice recognition  software.  Despite best efforts to proofread,  errors can occur which can change meaning.      Schuyler Amor, MD 11/06/16 1339    Schuyler Amor, MD 11/06/16 1440

## 2016-11-06 NOTE — ED Notes (Signed)
Pt alert and oriented X4, active, cooperative, pt in NAD. RR even and unlabored, color WNL.  Pt informed to return if any life threatening symptoms occur.   

## 2016-11-06 NOTE — ED Triage Notes (Addendum)
Pt states at 10:50 am she began to feel faint, weak, nauseas and tingling in bilateral size of her face and lips, states she called obgyn and they sent her to ED, per Baker Janus in Labor and delivery pt to be seen in ER first for medical clearance, pt denies any vaginal bleeding or abd pain, awake and alert in no acute distress, pt due date 02/14/17, 25 weeks

## 2016-11-06 NOTE — ED Triage Notes (Signed)
Patient presents to ED via POV from work. Patient states she felt faint and nauseous at work. Patient denies LOC. Patient states she ate breakfast. Patient is [redacted] weeks pregnant.

## 2016-11-06 NOTE — Progress Notes (Signed)
Patient ID: [name], male   DOB: 1980-05-14, 36 y.o.   MRN: [id number]  [name] is a 36 y.o. male. She is at [redacted]w[redacted]d gestation. Patient's last menstrual period was 05/10/2016 (exact date). Estimated Date of Delivery: 02/14/17  Prenatal care site: [facility] Chief complaint: Dizziness today at school . No diarrhea . Nl fetal movt   Location: Onset/timing: Duration: Quality:  Severity: Aggravating or alleviating conditions: Associated signs/symptoms: Context:  S: Resting comfortably. no CTX, no VB.no LOF,  Active fetal movement.   Maternal Medical History:   Past Medical History:  Diagnosis Date  . Asthma   . Breast cyst   . Breast mass   . Depression   . Fibrocystic breast   . Fibromyalgia   . Migraines     Past Surgical History:  Procedure Laterality Date  . BUNIONECTOMY Bilateral   . EYE SURGERY     blood clot in eye  . HERNIA REPAIR  1991, 2001   inguinal and umbilical  . LAPAROSCOPIC GASTRIC SLEEVE RESECTION  2015    Allergies  Allergen Reactions  . Penicillins Anaphylaxis  . Chlorhexidine   . Chlorhexidine Gluconate   . Clonazepam     Indifference to life attitude  . Dexamethasone Hives  . Doxycycline Nausea And Vomiting    Severe vomiting  . Fluoxetine Other (See Comments)    Suicidal thoughts  . Nsaids     No nsaids due to gastric surgery  . Other     CHLORAHEXADINE  . Hydrocodone-Acetaminophen Hives and Rash  . Tape Rash  . Tuberculin Ppd Rash    Prior to Admission medications   Medication Sig Start Date End Date Taking? Authorizing Provider  B Complex-C (SUPER B COMPLEX/VITAMIN C PO) Take 1 tablet by mouth daily.    Yes [provider]  calcium gluconate 500 MG tablet Take 1 tablet by mouth 3 (three) times daily.   Yes [provider]  cholecalciferol (VITAMIN D) 1000 units tablet Take 1,000 Units by mouth daily.   Yes [provider]  Prenatal Vit-Fe Fumarate-FA (PRENATAL MULTIVITAMIN) TABS  tablet Take 1 tablet by mouth daily at 12 noon.   Yes [provider]     Social History: She  reports that she quit smoking about 13 years ago. She has never used smokeless tobacco. She reports that she does not drink alcohol or use drugs.  Family History: family history includes Breast cancer in her paternal aunt, paternal aunt, and paternal grandmother; CVA in her father; Diabetes in her father; Healthy in her mother; Hypertension in her father.  no history of gyn cancers  Review of Systems: A full review of systems was performed and negative except as noted in the HPI.     O:  BP 113/66   Pulse 75   Temp (!) 97.5 F (36.4 C) (Oral)   Resp 20   Ht 5\' 10"  (1.778 m)   Wt 90.7 kg (200 lb)   LMP 05/10/2016 (Exact Date)   SpO2 100%   BMI 28.70 kg/m  Results for orders placed or performed during the hospital encounter of 11/06/16 (from the past 48 hour(s))  Basic metabolic panel   Collection Time: 11/06/16 12:07 PM  Result Value Ref Range   Sodium 131 (L) 135 - 145 mmol/L   Potassium 4.0 3.5 - 5.1 mmol/L   Chloride 99 (L) 101 - 111 mmol/L   CO2 26 22 - 32 mmol/L   Glucose, Bld 94 65 - 99  mg/dL   BUN 8 6 - 20 mg/dL   Creatinine, Ser 0.58 0.44 - 1.00 mg/dL   Calcium 8.7 (L) 8.9 - 10.3 mg/dL   GFR calc non Af Amer >60 >60 mL/min   GFR calc Af Amer >60 >60 mL/min   Anion gap 6 5 - 15  CBC   Collection Time: 11/06/16 12:07 PM  Result Value Ref Range   WBC 9.4 3.6 - 11.0 K/uL   RBC 4.26 3.80 - 5.20 MIL/uL   Hemoglobin 13.4 12.0 - 16.0 g/dL   HCT 38.6 35.0 - 47.0 %   MCV 90.5 80.0 - 100.0 fL   MCH 31.4 26.0 - 34.0 pg   MCHC 34.7 32.0 - 36.0 g/dL   RDW 12.5 11.5 - 14.5 %   Platelets 238 150 - 440 K/uL  Urinalysis, Complete w Microscopic   Collection Time: 11/06/16 12:33 PM  Result Value Ref Range   Color, Urine YELLOW (A) YELLOW   APPearance HAZY (A) CLEAR   Specific Gravity, Urine 1.009 1.005 - 1.030   pH 5.0 5.0 - 8.0   Glucose, UA 50 (A) NEGATIVE mg/dL   Hgb  urine dipstick NEGATIVE NEGATIVE   Bilirubin Urine NEGATIVE NEGATIVE   Ketones, ur NEGATIVE NEGATIVE mg/dL   Protein, ur NEGATIVE NEGATIVE mg/dL   Nitrite NEGATIVE NEGATIVE   Leukocytes, UA NEGATIVE NEGATIVE   RBC / HPF 0-5 0 - 5 RBC/hpf   WBC, UA 0-5 0 - 5 WBC/hpf   Bacteria, UA RARE (A) NONE SEEN   Squamous Epithelial / LPF 0-5 (A) NONE SEEN   Mucus PRESENT      Constitutional: NAD, AAOx3  HE/ENT: extraocular movements grossly intact, moist mucous membranes CV: RRR PULM: nl respiratory effort, CTABL     Abd: gravid, non-tender, non-distended, soft      Ext: Non-tender, Nonedmeatous   Psych: mood appropriate, speech normal Pelvic deferred  NST: reassuring     A/P: 36 y.o. [redacted]w[redacted]d here fordizziness, clinically looks very stable . Maybe a hypoglycemic episode Labor: not present.   Fetal Wellbeing: Reassuring Cat 1 tracing.  D/c home stable, precautions reviewed, follow-up as scheduled.   ----- [name] [name] MD MD Attending Obstetrician and Gynecologist [facility], Department of [facility]

## 2016-11-06 NOTE — Discharge Summary (Signed)
[name], [name], MD  Obstetrics    [] Hide copied text [] Hover for attribution information Patient ID: AUSAR [name], male   DOB: 07-Aug-1980, 36 y.o.   MRN: [id number]  [name] is a 36 y.o. male. She is at [redacted]w[redacted]d gestation. Patient's last menstrual period was 05/10/2016 (exact date). Estimated Date of Delivery: 02/14/17  Prenatal care site: [facility] Chief complaint: Dizziness today at school . No diarrhea . Nl fetal movt   Location: Onset/timing: Duration: Quality:  Severity: Aggravating or alleviating conditions: Associated signs/symptoms: Context:  S: Resting comfortably. noCTX, no VB.no LOF, Active fetal movement.   Maternal Medical History:       Past Medical History:  Diagnosis Date  . Asthma   . Breast cyst   . Breast mass   . Depression   . Fibrocystic breast   . Fibromyalgia   . Migraines          Past Surgical History:  Procedure Laterality Date  . BUNIONECTOMY Bilateral   . EYE SURGERY     blood clot in eye  . HERNIA REPAIR  1991, 2001   inguinal and umbilical  . LAPAROSCOPIC GASTRIC SLEEVE RESECTION  2015         Allergies  Allergen Reactions  . Penicillins Anaphylaxis  . Chlorhexidine   . Chlorhexidine Gluconate   . Clonazepam     Indifference to life attitude  . Dexamethasone Hives  . Doxycycline Nausea And Vomiting    Severe vomiting  . Fluoxetine Other (See Comments)    Suicidal thoughts  . Nsaids     No nsaids due to gastric surgery  . Other     CHLORAHEXADINE  . Hydrocodone-Acetaminophen Hives and Rash  . Tape Rash  . Tuberculin Ppd Rash           Prior to Admission medications   Medication Sig Start Date End Date Taking? Authorizing Provider  B Complex-C (SUPER B COMPLEX/VITAMIN C PO) Take 1 tablet by mouth daily.    Yes [provider]  calcium gluconate 500 MG tablet Take 1 tablet by mouth 3 (three) times daily.   Yes [provider]    cholecalciferol (VITAMIN D) 1000 units tablet Take 1,000 Units by mouth daily.   Yes [provider]  Prenatal Vit-Fe Fumarate-FA (PRENATAL MULTIVITAMIN) TABS tablet Take 1 tablet by mouth daily at 12 noon.   Yes [provider]     Social History: She  reports that she quit smoking about 13 years ago. She has never used smokeless tobacco. She reports that she does not drink alcohol or use drugs.  Family History: family history includes Breast cancer in her paternal aunt, paternal aunt, and paternal grandmother; CVA in her father; Diabetes in her father; Healthy in her mother; Hypertension in her father.  no history of gyn cancers  Review of Systems: A full review of systems was performed and negative except as noted in the HPI.     O:  BP 113/66   Pulse 75   Temp (!) 97.5 F (36.4 C) (Oral)   Resp 20   Ht 5\' 10"  (1.778 m)   Wt 90.7 kg (200 lb)   LMP 05/10/2016 (Exact Date)   SpO2 100%   BMI 28.70 kg/m       Results for orders placed or performed during the hospital encounter of 11/06/16 (from the past 48 hour(s))  Basic metabolic panel   Collection Time: 11/06/16 12:07 PM  Result Value Ref Range  Sodium 131 (L) 135 - 145 mmol/L   Potassium 4.0 3.5 - 5.1 mmol/L   Chloride 99 (L) 101 - 111 mmol/L   CO2 26 22 - 32 mmol/L   Glucose, Bld 94 65 - 99 mg/dL   BUN 8 6 - 20 mg/dL   Creatinine, Ser 0.58 0.44 - 1.00 mg/dL   Calcium 8.7 (L) 8.9 - 10.3 mg/dL   GFR calc non Af Amer >60 >60 mL/min   GFR calc Af Amer >60 >60 mL/min   Anion gap 6 5 - 15  CBC   Collection Time: 11/06/16 12:07 PM  Result Value Ref Range   WBC 9.4 3.6 - 11.0 K/uL   RBC 4.26 3.80 - 5.20 MIL/uL   Hemoglobin 13.4 12.0 - 16.0 g/dL   HCT 38.6 35.0 - 47.0 %   MCV 90.5 80.0 - 100.0 fL   MCH 31.4 26.0 - 34.0 pg   MCHC 34.7 32.0 - 36.0 g/dL   RDW 12.5 11.5 - 14.5 %   Platelets 238 150 - 440 K/uL  Urinalysis, Complete w Microscopic   Collection Time:  11/06/16 12:33 PM  Result Value Ref Range   Color, Urine YELLOW (A) YELLOW   APPearance HAZY (A) CLEAR   Specific Gravity, Urine 1.009 1.005 - 1.030   pH 5.0 5.0 - 8.0   Glucose, UA 50 (A) NEGATIVE mg/dL   Hgb urine dipstick NEGATIVE NEGATIVE   Bilirubin Urine NEGATIVE NEGATIVE   Ketones, ur NEGATIVE NEGATIVE mg/dL   Protein, ur NEGATIVE NEGATIVE mg/dL   Nitrite NEGATIVE NEGATIVE   Leukocytes, UA NEGATIVE NEGATIVE   RBC / HPF 0-5 0 - 5 RBC/hpf   WBC, UA 0-5 0 - 5 WBC/hpf   Bacteria, UA RARE (A) NONE SEEN   Squamous Epithelial / LPF 0-5 (A) NONE SEEN   Mucus PRESENT    Constitutional: NAD, AAOx3  HE/ENT: extraocular movements grossly intact, moist mucous membranes CV: RRR PULM: nl respiratory effort, CTABL                                         Abd: gravid, non-tender, non-distended, soft                                                  Ext: Non-tender, Nonedmeatous                     Psych: mood appropriate, speech normal Pelvic deferred  NST: reassuring     A/P: 36 y.o. [redacted]w[redacted]d here fordizziness, clinically looks very stable . Maybe a hypoglycemic episode Labor: not present.   Fetal Wellbeing: Reassuring Cat 1 tracing.  D/c home stable, precautions reviewed, follow-up as scheduled.   ----- [name] [name] MD MD Attending Obstetrician and Gynecologist [facility], Department of [facility]

## 2016-11-06 NOTE — Discharge Instructions (Signed)
Drink plenty of fluid, please go upstairs to see OB/GYN at this time

## 2016-11-06 NOTE — ED Notes (Signed)
ED Provider at bedside. 

## 2016-11-06 NOTE — ED Notes (Signed)
Pt taken to OBS 4 by Waunita Schooner, EMTP via wheelchair. Pt left with family and all of belongings. Pt alert and oriented X4, active, cooperative, pt in NAD. RR even and unlabored, color WNL.

## 2016-11-06 NOTE — OB Triage Note (Signed)
Pt presents for dizziness, faint and sweating started today at 1045 this morning. States she had tingling in face and lips. Pt says she ate a good breakfast.  Pt denies any pain, bleeding or LOF. Reports positive fetal movement. Will continue to monitor

## 2016-11-06 NOTE — ED Notes (Signed)
Pt given snack. 

## 2016-11-14 ENCOUNTER — Ambulatory Visit
Admission: RE | Admit: 2016-11-14 | Discharge: 2016-11-14 | Disposition: A | Discharge status: Home / Self Care | Payer: BC Managed Care – PPO | Source: Ambulatory Visit | Attending: Obstetrics & Gynecology | Admitting: Obstetrics & Gynecology

## 2016-11-14 ENCOUNTER — Ambulatory Visit
Admission: RE | Admit: 2016-11-14 | Discharge: 2016-11-14 | Disposition: A | Discharge status: Home / Self Care | Payer: BC Managed Care – PPO | Source: Ambulatory Visit | Attending: Maternal & Fetal Medicine | Admitting: Maternal & Fetal Medicine

## 2016-11-14 DIAGNOSIS — O26892 Other specified pregnancy related conditions, second trimester: Secondary | ICD-10-CM | POA: Insufficient documentation

## 2016-11-14 DIAGNOSIS — Z3689 Encounter for other specified antenatal screening: Secondary | ICD-10-CM

## 2016-11-14 DIAGNOSIS — Z3A26 26 weeks gestation of pregnancy: Secondary | ICD-10-CM | POA: Diagnosis not present

## 2016-11-14 DIAGNOSIS — F329 Major depressive disorder, single episode, unspecified: Secondary | ICD-10-CM | POA: Diagnosis not present

## 2016-11-14 DIAGNOSIS — Z9884 Bariatric surgery status: Secondary | ICD-10-CM | POA: Diagnosis not present

## 2016-11-14 DIAGNOSIS — M797 Fibromyalgia: Secondary | ICD-10-CM | POA: Diagnosis not present

## 2016-11-14 DIAGNOSIS — O99342 Other mental disorders complicating pregnancy, second trimester: Secondary | ICD-10-CM | POA: Insufficient documentation

## 2016-11-14 DIAGNOSIS — O99512 Diseases of the respiratory system complicating pregnancy, second trimester: Secondary | ICD-10-CM | POA: Insufficient documentation

## 2016-11-14 DIAGNOSIS — J45909 Unspecified asthma, uncomplicated: Secondary | ICD-10-CM

## 2016-11-14 DIAGNOSIS — G43909 Migraine, unspecified, not intractable, without status migrainosus: Secondary | ICD-10-CM | POA: Diagnosis not present

## 2016-11-14 DIAGNOSIS — R790 Abnormal level of blood mineral: Secondary | ICD-10-CM

## 2016-11-14 DIAGNOSIS — G43809 Other migraine, not intractable, without status migrainosus: Secondary | ICD-10-CM

## 2016-11-14 NOTE — Progress Notes (Signed)
MFM consultation Cu level received (09/23/16) 223 mcg/dL (range 70-175) Lab Results  Component Value Date   WBC 9.4 11/06/2016   HGB 13.4 11/06/2016   HCT 38.6 11/06/2016   MCV 90.5 11/06/2016   PLT 238 11/06/2016    Lab can be repeated ~32-34 weeks and follow up ultrasound (to assess for fetal hepatomegaly) as well as pediatrician notification can be arranged if higher.

## 2016-11-14 NOTE — Progress Notes (Signed)
MFM Consultation   36 yo G2P0010 (Sab at 9w 2d) at 26/6 weeks by LMP (and home IUI) consistent with US performed at [facility] ob[facility] on 07/05/16; measurements were consistent with 8 weeks.  She is here for  evaluation due to history of elevated Cu level noted at time of labs for post-gastric sleeve surveillance.  Her history is also significant for migraines, fibrocystic breast disease, and mild asthma. CP cysts were noted on outside ultrasound. She had neg cffDNA screening.  She is here today with her wife.  They have 3 other adopted kids at home.    Past Medical History:  Diagnosis Date  . Asthma   . Breast cyst   . Breast mass   . Depression   . Fibrocystic breast   . Fibromyalgia   . Migraines   migraines are associated with menses--none during pregnancy  OB History    Gravida Para Term Preterm AB Living   2         0   SAB TAB Ectopic Multiple Live Births   0            no h/o abnormal pap  Past Surgical History:  Procedure Laterality Date  . BUNIONECTOMY Bilateral   . EYE SURGERY     blood clot in eye  . HERNIA REPAIR  1991, 2001   inguinal and umbilical  . LAPAROSCOPIC GASTRIC SLEEVE RESECTION  2015   Allergies  Allergen Reactions  . Penicillins Anaphylaxis  . Chlorhexidine   . Chlorhexidine Gluconate   . Clonazepam     Indifference to life attitude  . Dexamethasone Hives  . Doxycycline Nausea And Vomiting    Severe vomiting  . Fluoxetine Other (See Comments)    Suicidal thoughts  . Nsaids     No nsaids due to gastric surgery  . Other     CHLORAHEXADINE  . Hydrocodone-Acetaminophen Hives and Rash  . Tape Rash  . Tuberculin Ppd Rash    Social history Married, male spouse.  No tobacco(quit), etoh, ilicit drug use. 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 Couple has 3 other kids.    Family History  Problem Relation Age of Onset  . Breast cancer Paternal Aunt   . Breast cancer Paternal Aunt   . Healthy Mother   . CVA Father   . Hypertension  Father   . Diabetes Father   . Breast cancer Paternal Grandmother    Normal detailed anatomic survey today--no cp cysts seen.    1. 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 No history of Wilson's disease/storage deficiency.  Not known to be associated with teratogenicity.  Persistent, severe elevations have been associated with fetal hepatomegaly--postnatal treatment corrected neonatal effects.   2. History of bariatric surgery--gastric sleeve therapy.  Pt avoids NSAIDS due to risk for gastric ulcer disease (as well as her allergy) Continues to have surveillance with gastric surgeon.   3. Migraines--stable.  Has only needed tylenol during pregnancy  4. Depression (per report) no meds currently. Close surveillance postpartum for postpartum depression.   5. AMA--normal detailed anatomic survey today and neg cffDNA screening.   Time 63minutes

## 2017-01-01 ENCOUNTER — Observation Stay
Admission: EM | Admit: 2017-01-01 | Discharge: 2017-01-01 | Disposition: A | Discharge status: Home / Self Care | Payer: BC Managed Care – PPO | Attending: Obstetrics and Gynecology | Admitting: Obstetrics and Gynecology

## 2017-01-01 ENCOUNTER — Encounter: Payer: Self-pay | Admitting: *Deleted

## 2017-01-01 DIAGNOSIS — Z3490 Encounter for supervision of normal pregnancy, unspecified, unspecified trimester: Secondary | ICD-10-CM

## 2017-01-01 DIAGNOSIS — Z91048 Other nonmedicinal substance allergy status: Secondary | ICD-10-CM | POA: Diagnosis not present

## 2017-01-01 DIAGNOSIS — Z888 Allergy status to other drugs, medicaments and biological substances status: Secondary | ICD-10-CM | POA: Insufficient documentation

## 2017-01-01 DIAGNOSIS — Z79899 Other long term (current) drug therapy: Secondary | ICD-10-CM | POA: Insufficient documentation

## 2017-01-01 DIAGNOSIS — O368130 Decreased fetal movements, third trimester, not applicable or unspecified: Secondary | ICD-10-CM | POA: Diagnosis not present

## 2017-01-01 DIAGNOSIS — Z3A33 33 weeks gestation of pregnancy: Secondary | ICD-10-CM | POA: Diagnosis not present

## 2017-01-01 DIAGNOSIS — Z880 Allergy status to penicillin: Secondary | ICD-10-CM | POA: Diagnosis not present

## 2017-01-01 DIAGNOSIS — O2690 Pregnancy related conditions, unspecified, unspecified trimester: Secondary | ICD-10-CM | POA: Diagnosis present

## 2017-01-01 NOTE — OB Triage Note (Signed)
Presents with complaint of decreased fetal movement and some abdominal tightening. Denies bleeding or leakage of fluid.

## 2017-01-02 NOTE — Discharge Summary (Signed)
[name] is a 36 y.o. male. She is at [redacted]w[redacted]d gestation. Patient's last menstrual period was 05/10/2016 (exact date). Estimated Date of Delivery: 02/14/17  Prenatal care site: [facility] Chief complaint:   S: Resting comfortably. no CTX, no VB.no LOF,   c/o of decreased fetal movt and hard belly   Maternal Medical History:   Past Medical History:  Diagnosis Date  . Asthma   . Breast cyst   . Breast mass   . Depression   . Fibrocystic breast   . Fibromyalgia   . Migraines     Past Surgical History:  Procedure Laterality Date  . BUNIONECTOMY Bilateral   . EYE SURGERY     blood clot in eye  . HERNIA REPAIR  1991, 2001   inguinal and umbilical  . LAPAROSCOPIC GASTRIC SLEEVE RESECTION  2015    Allergies  Allergen Reactions  . Penicillins Anaphylaxis  . Chlorhexidine   . Chlorhexidine Gluconate   . Clonazepam     Indifference to life attitude  . Dexamethasone Hives  . Doxycycline Nausea And Vomiting    Severe vomiting  . Fluoxetine Other (See Comments)    Suicidal thoughts  . Nsaids     No nsaids due to gastric surgery  . Other     CHLORAHEXADINE  . Hydrocodone-Acetaminophen Hives and Rash  . Tape Rash  . Tuberculin Ppd Rash    Prior to Admission medications   Medication Sig Start Date End Date Taking? Authorizing Provider  B Complex-C (SUPER B COMPLEX/VITAMIN C PO) Take 1 tablet by mouth daily.     [provider]  calcium gluconate 500 MG tablet Take 1 tablet by mouth 3 (three) times daily.    [provider]  cholecalciferol (VITAMIN D) 1000 units tablet Take 1,000 Units by mouth daily.    [provider]  Prenatal Vit-Fe Fumarate-FA (PRENATAL MULTIVITAMIN) TABS tablet Take 1 tablet by mouth daily at 12 noon.    [provider]     Social History: She  reports that she quit smoking about 13 years ago. She has never used smokeless tobacco. She reports that she does not drink alcohol or use drugs.  Family  History: family history includes Breast cancer in her paternal aunt, paternal aunt, and paternal grandmother; CVA in her father; Diabetes in her father; Healthy in her mother; Hypertension in her father.  no history of gyn cancers  Review of Systems: A full review of systems was performed and negative except as noted in the HPI.     O:  Resp 16   Ht 5\' 10"  (1.778 m)   Wt 89.8 kg (198 lb)   LMP 05/10/2016 (Exact Date)   BMI 28.41 kg/m  No results found for this or any previous visit (from the past 43 hour(s)).   Constitutional: NAD, AAOx3  HE/ENT: extraocular movements grossly intact, moist mucous membranes CV: RRR PULM: nl respiratory effort, CTABL     Abd: gravid, non-tender, non-distended, soft      Ext: Non-tender, Nonedmeatous   Psych: mood appropriate, speech normal Pelvic deferred  NST: Reactive No decels  Mother feels baby move well while monitoring     A/P: 36 y.o. [redacted]w[redacted]d here for antenatal surveillance decrease FM   Labor: not present.   Fetal Wellbeing: Reassuring Cat 1 tracing.  Reactive NST   D/c home stable, precautions reviewed, follow-up as scheduled.   ----- Marye Round MD  Attending Obstetrician and Gynecologist [facility], Department of [facility]  [facility]

## 2017-01-02 NOTE — Progress Notes (Signed)
Patient ID: [name], male   DOB: 10-Mar-1981, 36 y.o.   MRN: [id number] [name] is a 36 y.o. male. She is at [redacted]w[redacted]d gestation. Patient's last menstrual period was 05/10/2016 (exact date). Estimated Date of Delivery: 02/14/17  Prenatal care site: [facility] Chief complaint:   S: Resting comfortably. no CTX, no VB.no LOF,   c/o of decreased fetal movt and hard belly   Maternal Medical History:   Past Medical History:  Diagnosis Date  . Asthma   . Breast cyst   . Breast mass   . Depression   . Fibrocystic breast   . Fibromyalgia   . Migraines     Past Surgical History:  Procedure Laterality Date  . BUNIONECTOMY Bilateral   . EYE SURGERY     blood clot in eye  . HERNIA REPAIR  1991, 2001   inguinal and umbilical  . LAPAROSCOPIC GASTRIC SLEEVE RESECTION  2015    Allergies  Allergen Reactions  . Penicillins Anaphylaxis  . Chlorhexidine   . Chlorhexidine Gluconate   . Clonazepam     Indifference to life attitude  . Dexamethasone Hives  . Doxycycline Nausea And Vomiting    Severe vomiting  . Fluoxetine Other (See Comments)    Suicidal thoughts  . Nsaids     No nsaids due to gastric surgery  . Other     CHLORAHEXADINE  . Hydrocodone-Acetaminophen Hives and Rash  . Tape Rash  . Tuberculin Ppd Rash    Prior to Admission medications   Medication Sig Start Date End Date Taking? Authorizing Provider  B Complex-C (SUPER B COMPLEX/VITAMIN C PO) Take 1 tablet by mouth daily.     [provider]  calcium gluconate 500 MG tablet Take 1 tablet by mouth 3 (three) times daily.    [provider]  cholecalciferol (VITAMIN D) 1000 units tablet Take 1,000 Units by mouth daily.    [provider]  Prenatal Vit-Fe Fumarate-FA (PRENATAL MULTIVITAMIN) TABS tablet Take 1 tablet by mouth daily at 12 noon.    [provider]     Social History: She  reports that she quit smoking about 13 years ago. She has never used smokeless  tobacco. She reports that she does not drink alcohol or use drugs.  Family History: family history includes Breast cancer in her paternal aunt, paternal aunt, and paternal grandmother; CVA in her father; Diabetes in her father; Healthy in her mother; Hypertension in her father.  no history of gyn cancers  Review of Systems: A full review of systems was performed and negative except as noted in the HPI.     O:  Resp 16   Ht 5\' 10"  (1.778 m)   Wt 89.8 kg (198 lb)   LMP 05/10/2016 (Exact Date)   BMI 28.41 kg/m  No results found for this or any previous visit (from the past 47 hour(s)).   Constitutional: NAD, AAOx3  HE/ENT: extraocular movements grossly intact, moist mucous membranes CV: RRR PULM: nl respiratory effort, CTABL     Abd: gravid, non-tender, non-distended, soft      Ext: Non-tender, Nonedmeatous   Psych: mood appropriate, speech normal Pelvic deferred  NST: Reactive No decels  Mother feels baby move well while monitoring     A/P: 36 y.o. [redacted]w[redacted]d here for antenatal surveillance decrease FM   Labor: not present.   Fetal Wellbeing: Reassuring Cat 1 tracing.  Reactive NST   D/c home stable, precautions reviewed, follow-up as scheduled.   -----  Marcello Moores Schermerhiorn MD  Attending Obstetrician and Gynecologist [facility], Department of OB/GYN [facility]

## 2017-01-06 ENCOUNTER — Other Ambulatory Visit: Payer: Self-pay | Admitting: Obstetrics and Gynecology

## 2017-01-06 DIAGNOSIS — N6310 Unspecified lump in the right breast, unspecified quadrant: Secondary | ICD-10-CM

## 2017-01-20 LAB — OB RESULTS CONSOLE HIV ANTIBODY (ROUTINE TESTING): HIV: NONREACTIVE

## 2017-01-20 LAB — OB RESULTS CONSOLE RPR: RPR: NONREACTIVE

## 2017-01-22 LAB — OB RESULTS CONSOLE GC/CHLAMYDIA
CHLAMYDIA, DNA PROBE: NEGATIVE
GC PROBE AMP, GENITAL: NEGATIVE

## 2017-01-22 LAB — OB RESULTS CONSOLE GBS: GBS: NEGATIVE

## 2017-01-27 ENCOUNTER — Other Ambulatory Visit: Payer: BC Managed Care – PPO

## 2017-02-15 ENCOUNTER — Inpatient Hospital Stay
Admission: EM | Admit: 2017-02-15 | Discharge: 2017-02-18 | DRG: 807 | Disposition: A | Discharge status: Home / Self Care | Payer: BC Managed Care – PPO | Attending: Obstetrics & Gynecology | Admitting: Obstetrics & Gynecology

## 2017-02-15 ENCOUNTER — Other Ambulatory Visit: Payer: Self-pay

## 2017-02-15 DIAGNOSIS — Z87891 Personal history of nicotine dependence: Secondary | ICD-10-CM | POA: Diagnosis not present

## 2017-02-15 DIAGNOSIS — Z3A40 40 weeks gestation of pregnancy: Secondary | ICD-10-CM | POA: Diagnosis not present

## 2017-02-15 DIAGNOSIS — O480 Post-term pregnancy: Principal | ICD-10-CM | POA: Diagnosis present

## 2017-02-15 LAB — CBC
HCT: 38.2 % (ref 35.0–47.0)
HEMOGLOBIN: 13 g/dL (ref 12.0–16.0)
MCH: 30.2 pg (ref 26.0–34.0)
MCHC: 34.1 g/dL (ref 32.0–36.0)
MCV: 88.6 fL (ref 80.0–100.0)
Platelets: 231 10*3/uL (ref 150–440)
RBC: 4.31 MIL/uL (ref 3.80–5.20)
RDW: 13.3 % (ref 11.5–14.5)
WBC: 7.9 10*3/uL (ref 3.6–11.0)

## 2017-02-15 LAB — TYPE AND SCREEN
ABO/RH(D): O POS
Antibody Screen: NEGATIVE

## 2017-02-15 MED ORDER — ONDANSETRON HCL 4 MG/2ML IJ SOLN
4.0000 mg | Freq: Four times a day (QID) | INTRAMUSCULAR | Status: DC | PRN
  Administered 2017-02-16: 4 mg via INTRAVENOUS
  Filled 2017-02-15: qty 2

## 2017-02-15 MED ORDER — MISOPROSTOL 25 MCG QUARTER TABLET
25.0000 ug | ORAL_TABLET | ORAL | Status: DC | PRN
  Administered 2017-02-15 (×2): 25 ug via ORAL
  Filled 2017-02-15 (×2): qty 1

## 2017-02-15 MED ORDER — OXYTOCIN BOLUS FROM INFUSION
500.0000 mL | Freq: Once | INTRAVENOUS | Status: AC
  Administered 2017-02-16: 500 mL via INTRAVENOUS

## 2017-02-15 MED ORDER — OXYTOCIN 40 UNITS IN LACTATED RINGERS INFUSION - SIMPLE MED
INTRAVENOUS | Status: AC
  Filled 2017-02-15: qty 1000

## 2017-02-15 MED ORDER — LACTATED RINGERS IV SOLN
INTRAVENOUS | Status: DC
  Administered 2017-02-15 (×2): 1000 mL via INTRAVENOUS
  Administered 2017-02-16 (×4): via INTRAVENOUS

## 2017-02-15 MED ORDER — OXYTOCIN 10 UNIT/ML IJ SOLN
INTRAMUSCULAR | Status: AC
  Filled 2017-02-15: qty 2

## 2017-02-15 MED ORDER — SOD CITRATE-CITRIC ACID 500-334 MG/5ML PO SOLN: 30.0000 mL | ORAL | Status: DC | PRN

## 2017-02-15 MED ORDER — LIDOCAINE HCL (PF) 1 % IJ SOLN
30.0000 mL | INTRAMUSCULAR | Status: DC | PRN
  Filled 2017-02-15: qty 30

## 2017-02-15 MED ORDER — LACTATED RINGERS IV SOLN: 500.0000 mL | INTRAVENOUS | Status: DC | PRN

## 2017-02-15 MED ORDER — AMMONIA AROMATIC IN INHA
RESPIRATORY_TRACT | Status: AC
  Filled 2017-02-15: qty 10

## 2017-02-15 MED ORDER — OXYTOCIN 40 UNITS IN LACTATED RINGERS INFUSION - SIMPLE MED
1.0000 m[IU]/min | INTRAVENOUS | Status: DC
  Administered 2017-02-16: 10 m[IU]/min via INTRAVENOUS
  Administered 2017-02-16: 8 m[IU]/min via INTRAVENOUS
  Administered 2017-02-16: 1 m[IU]/min via INTRAVENOUS

## 2017-02-15 MED ORDER — OXYTOCIN 40 UNITS IN LACTATED RINGERS INFUSION - SIMPLE MED
2.5000 [IU]/h | INTRAVENOUS | Status: DC
  Administered 2017-02-16: 2.5 [IU]/h via INTRAVENOUS
  Filled 2017-02-15: qty 1000

## 2017-02-15 MED ORDER — MISOPROSTOL 25 MCG QUARTER TABLET
25.0000 ug | ORAL_TABLET | ORAL | Status: DC | PRN
  Administered 2017-02-15: 25 ug via VAGINAL
  Filled 2017-02-15: qty 1

## 2017-02-15 MED ORDER — BUTORPHANOL TARTRATE 1 MG/ML IJ SOLN
1.0000 mg | INTRAMUSCULAR | Status: DC | PRN
Start: 2017-02-15 — End: 2017-02-16

## 2017-02-15 MED ORDER — TERBUTALINE SULFATE 1 MG/ML IJ SOLN: 0.2500 mg | Freq: Once | INTRAMUSCULAR | Status: DC | PRN

## 2017-02-15 MED ORDER — LIDOCAINE HCL (PF) 1 % IJ SOLN
INTRAMUSCULAR | Status: AC
  Filled 2017-02-15: qty 30

## 2017-02-15 MED ORDER — MISOPROSTOL 200 MCG PO TABS
ORAL_TABLET | ORAL | Status: AC
  Filled 2017-02-15: qty 4

## 2017-02-15 MED ORDER — ACETAMINOPHEN 500 MG PO TABS
1000.0000 mg | ORAL_TABLET | Freq: Four times a day (QID) | ORAL | Status: DC | PRN
  Administered 2017-02-16: 1000 mg via ORAL
  Filled 2017-02-15: qty 2

## 2017-02-15 NOTE — Progress Notes (Signed)
Spoke to Dr. Leonides Schanz at 2045 re: wireless monitors malfunctioning while pt ambulating. Ultrasound adjusted and fetal heart rate assessed with moderate variability and  baseline of 135. MD okay with pt ambulating for 30 minutes at a time without monitoring. Monitors removed at 2051, pt ambulating, will reassess in 30 minutes.

## 2017-02-15 NOTE — H&P (Signed)
OB History & Physical   History of Present Illness:  Chief Complaint:   HPI:  [name] is a 36 y.o. G2P0 male at [redacted]w[redacted]d dated by LMP and early ultrasound. Patient's last menstrual period was 05/10/2016 (exact date). Estimated Date of Delivery: 02/14/17 She presents to L&D for scheduled past due date induction. Says she lost her mucous plug this morning and has had occasional contractions overnight. In office she was closed/long/high and very posterior.  +FM, + CTX, no LOF, no VB  Pregnancy Issues: 1. H/o gastric sleeve 2. Elevated Cu level during pregnancy 3. AMA 4. H/o depression 5. H/o migraines 6. H/o asthma  Maternal Medical History:   Past Medical History:  Diagnosis Date  . Asthma   . Breast cyst   . Breast mass   . Depression   . Fibrocystic breast   . Fibromyalgia   . Migraines     Past Surgical History:  Procedure Laterality Date  . BUNIONECTOMY Bilateral   . EYE SURGERY     blood clot in eye  . HERNIA REPAIR  1991, 2001   inguinal and umbilical  . LAPAROSCOPIC GASTRIC SLEEVE RESECTION  2015    Allergies  Allergen Reactions  . Penicillins Anaphylaxis  . Chlorhexidine   . Chlorhexidine Gluconate   . Clonazepam     Indifference to life attitude  . Dexamethasone Hives  . Doxycycline Nausea And Vomiting    Severe vomiting  . Fluoxetine Other (See Comments)    Suicidal thoughts  . Nsaids     No nsaids due to gastric surgery  . Other     CHLORAHEXADINE  . Hydrocodone-Acetaminophen Hives and Rash  . Tape Rash  . Tuberculin Ppd Rash    Prior to Admission medications   Medication Sig Start Date End Date Taking? Authorizing Provider  B Complex-C (SUPER B COMPLEX/VITAMIN C PO) Take 1 tablet by mouth daily.     [provider]  calcium gluconate 500 MG tablet Take 1 tablet by mouth 3 (three) times daily.    [provider]  cholecalciferol (VITAMIN D) 1000 units tablet Take 1,000 Units by mouth daily.    [provider]  Prenatal Vit-Fe Fumarate-FA (PRENATAL MULTIVITAMIN) TABS tablet Take 1 tablet by mouth daily at 12 noon.    [provider]     Prenatal care site: [place] History: She  reports that she quit smoking about 13 years ago. she has never used smokeless tobacco. She reports that she does not drink alcohol or use drugs.  Family History: family history includes Breast cancer in her paternal aunt, paternal aunt, and paternal grandmother; CVA in her father; Diabetes in her father; Healthy in her mother; Hypertension in her father.   Review of Systems: A full review of systems was performed and negative except as noted in the HPI.     Physical Exam:  Vital Signs: Temp 97.7 F (36.5 C) (Oral)   Resp 16   LMP 05/10/2016 (Exact Date)  General: no acute distress.  HEENT: normocephalic, atraumatic Heart: regular rate & rhythm.  No murmurs/rubs/gallops Lungs: clear to auscultation bilaterally, normal respiratory effort Abdomen: soft, gravid, non-tender;  EFW: 7.0 Pelvic:   External: Normal external male genitalia  Cervix: 1/long/-2, soft and very posterior.   Extremities: non-tender, symmetric, no edema bilaterally.  DTRs: 2+ Neurologic: Alert & oriented x 3.     Pertinent Results:  Prenatal Labs: Blood type/Rh O+  Antibody screen neg  Rubella Immune  Varicella Immune  RPR NR  HBsAg Neg  HIV NR  GC neg  Chlamydia neg  Genetic screening negative  1 hour GTT 117  3 hour GTT   GBS negative  S/p Tdap and Flu  FHT: 120 mod + accels no decels TOCO: occasional   Cephalic by leopolds   Assessment:  [name] is a 36 y.o. G2P0 male at [redacted]w[redacted]d with IOL for past due date.   Plan:  1. Admit to Labor & Delivery 2. CBC, T&S, Clrs, IVF 3. GBS  neg 4. Consents obtained. 5. Continuous efm/toco 6. IUP category 1 7. IOL with cytotec, bucally and vaginally to start and then PRN per contraction pattern.   ----- Larey Days, MD Attending  Obstetrician and Gynecologist [facility], Department of [facility]

## 2017-02-16 ENCOUNTER — Inpatient Hospital Stay: Payer: BC Managed Care – PPO | Admitting: Anesthesiology

## 2017-02-16 LAB — RPR: RPR: NONREACTIVE

## 2017-02-16 MED ORDER — MISOPROSTOL 200 MCG PO TABS
ORAL_TABLET | ORAL | Status: AC
  Filled 2017-02-16: qty 4

## 2017-02-16 MED ORDER — DIPHENHYDRAMINE HCL 50 MG/ML IJ SOLN: 12.5000 mg | INTRAMUSCULAR | Status: DC | PRN

## 2017-02-16 MED ORDER — ONDANSETRON HCL 4 MG PO TABS: 4.0000 mg | ORAL_TABLET | ORAL | Status: DC | PRN

## 2017-02-16 MED ORDER — DOCUSATE SODIUM 100 MG PO CAPS
100.0000 mg | ORAL_CAPSULE | Freq: Two times a day (BID) | ORAL | Status: DC
  Administered 2017-02-17 (×2): 100 mg via ORAL
  Filled 2017-02-16 (×3): qty 1

## 2017-02-16 MED ORDER — ACETAMINOPHEN 500 MG PO TABS
1000.0000 mg | ORAL_TABLET | Freq: Four times a day (QID) | ORAL | Status: DC | PRN
  Administered 2017-02-17 – 2017-02-18 (×5): 1000 mg via ORAL
  Filled 2017-02-16 (×5): qty 2

## 2017-02-16 MED ORDER — LACTATED RINGERS IV SOLN: 500.0000 mL | Freq: Once | INTRAVENOUS | Status: DC

## 2017-02-16 MED ORDER — ONDANSETRON HCL 4 MG/2ML IJ SOLN: 4.0000 mg | INTRAMUSCULAR | Status: DC | PRN

## 2017-02-16 MED ORDER — SODIUM CHLORIDE 0.9 % IV SOLN
INTRAVENOUS | Status: DC | PRN
  Administered 2017-02-16 (×4): 5 mL via EPIDURAL

## 2017-02-16 MED ORDER — PRENATAL MULTIVITAMIN CH
1.0000 | ORAL_TABLET | Freq: Every day | ORAL | Status: DC
  Filled 2017-02-16: qty 1

## 2017-02-16 MED ORDER — PHENYLEPHRINE 40 MCG/ML (10ML) SYRINGE FOR IV PUSH (FOR BLOOD PRESSURE SUPPORT)
80.0000 ug | PREFILLED_SYRINGE | INTRAVENOUS | Status: DC | PRN
  Filled 2017-02-16: qty 5

## 2017-02-16 MED ORDER — LIDOCAINE HCL (PF) 1 % IJ SOLN
INTRAMUSCULAR | Status: DC | PRN
  Administered 2017-02-16: 3 mL via SUBCUTANEOUS

## 2017-02-16 MED ORDER — METHYLERGONOVINE MALEATE 0.2 MG/ML IJ SOLN
INTRAMUSCULAR | Status: AC
  Administered 2017-02-16: 0.2 mg
  Filled 2017-02-16: qty 1

## 2017-02-16 MED ORDER — AMMONIA AROMATIC IN INHA
RESPIRATORY_TRACT | Status: AC
  Filled 2017-02-16: qty 10

## 2017-02-16 MED ORDER — HYDROCORTISONE 2.5 % RE CREA
TOPICAL_CREAM | RECTAL | Status: DC
  Filled 2017-02-16: qty 28.35

## 2017-02-16 MED ORDER — DIPHENHYDRAMINE HCL 25 MG PO CAPS: 25.0000 mg | ORAL_CAPSULE | Freq: Four times a day (QID) | ORAL | Status: DC | PRN

## 2017-02-16 MED ORDER — FENTANYL 2.5 MCG/ML W/ROPIVACAINE 0.15% IN NS 100 ML EPIDURAL (ARMC)
EPIDURAL | Status: DC | PRN
  Administered 2017-02-16: 12 mL/h via EPIDURAL

## 2017-02-16 MED ORDER — WITCH HAZEL-GLYCERIN EX PADS: 1.0000 "application " | MEDICATED_PAD | CUTANEOUS | Status: DC

## 2017-02-16 MED ORDER — EPHEDRINE 5 MG/ML INJ
10.0000 mg | INTRAVENOUS | Status: DC | PRN
  Filled 2017-02-16: qty 2

## 2017-02-16 MED ORDER — BENZOCAINE-MENTHOL 20-0.5 % EX AERO
1.0000 "application " | INHALATION_SPRAY | CUTANEOUS | Status: DC | PRN
  Administered 2017-02-17: 1 via TOPICAL
  Filled 2017-02-16: qty 56

## 2017-02-16 MED ORDER — FENTANYL 2.5 MCG/ML W/ROPIVACAINE 0.15% IN NS 100 ML EPIDURAL (ARMC)
EPIDURAL | Status: AC
  Filled 2017-02-16: qty 100

## 2017-02-16 MED ORDER — OXYTOCIN 10 UNIT/ML IJ SOLN
INTRAMUSCULAR | Status: AC
  Filled 2017-02-16: qty 2

## 2017-02-16 MED ORDER — SIMETHICONE 80 MG PO CHEW: 80.0000 mg | CHEWABLE_TABLET | ORAL | Status: DC | PRN

## 2017-02-16 MED ORDER — NALBUPHINE HCL 10 MG/ML IJ SOLN: 5.0000 mg | INTRAMUSCULAR | Status: DC | PRN

## 2017-02-16 MED ORDER — LIDOCAINE-EPINEPHRINE (PF) 1.5 %-1:200000 IJ SOLN
INTRAMUSCULAR | Status: DC | PRN
  Administered 2017-02-16: 3 mL
  Administered 2017-02-16: 3 mL via EPIDURAL

## 2017-02-16 MED ORDER — COCONUT OIL OIL
1.0000 "application " | TOPICAL_OIL | Status: DC | PRN
  Administered 2017-02-17: 1 via TOPICAL
  Filled 2017-02-16: qty 120

## 2017-02-16 MED ORDER — FENTANYL 2.5 MCG/ML W/ROPIVACAINE 0.15% IN NS 100 ML EPIDURAL (ARMC)
12.0000 mL/h | EPIDURAL | Status: DC
  Administered 2017-02-16: 12 mL/h via EPIDURAL
  Filled 2017-02-16: qty 100

## 2017-02-16 NOTE — Anesthesia Procedure Notes (Signed)
Epidural Patient location during procedure: OB Start time: 02/16/2017 2:54 AM End time: 02/16/2017 3:04 AM  Staffing Anesthesiologist: Martha Clan, MD Performed: anesthesiologist   Preanesthetic Checklist Completed: patient identified, site marked, surgical consent, pre-op evaluation, timeout performed, IV checked, risks and benefits discussed and monitors and equipment checked  Epidural Patient position: sitting Prep: ChloraPrep Patient monitoring: heart rate, continuous pulse ox and blood pressure Approach: midline Location: L3-L4 Injection technique: LOR saline  Needle:  Needle type: Tuohy  Needle gauge: 17 G Needle length: 9 cm and 9 Needle insertion depth: 7 cm Catheter type: closed end flexible Catheter size: 19 Gauge Catheter at skin depth: 12 cm Test dose: negative and 1.5% lidocaine with Epi 1:200 K  Assessment Sensory level: T10 Events: blood not aspirated, injection not painful, no injection resistance, negative IV test and no paresthesia  Additional Notes Pt. Evaluated and documentation done after procedure finished. Patient identified. Risks/Benefits/Options discussed with patient including but not limited to bleeding, infection, nerve damage, paralysis, failed block, incomplete pain control, headache, blood pressure changes, nausea, vomiting, reactions to medication both or allergic, itching and postpartum back pain. Confirmed with bedside nurse the patient's most recent platelet count. Confirmed with patient that they are not currently taking any anticoagulation, have any bleeding history or any family history of bleeding disorders. Patient expressed understanding and wished to proceed. All questions were answered. Sterile technique was used throughout the entire procedure. Please see nursing notes for vital signs. Test dose was given through epidural catheter and negative prior to continuing to dose epidural or start infusion. Warning signs of high block given to  the patient including shortness of breath, tingling/numbness in hands, complete motor block, or any concerning symptoms with instructions to call for help. Patient was given instructions on fall risk and not to get out of bed. All questions and concerns addressed with instructions to call with any issues or inadequate analgesia.   Patient tolerated the insertion well without immediate complications.Reason for block:procedure for pain

## 2017-02-16 NOTE — Anesthesia Procedure Notes (Signed)
Epidural Patient location during procedure: OB Start time: 02/16/2017 2:20 PM End time: 02/16/2017 2:40 PM  Staffing Anesthesiologist: Emmie Niemann, MD Performed: anesthesiologist   Preanesthetic Checklist Completed: patient identified, site marked, surgical consent, pre-op evaluation, timeout performed, IV checked, risks and benefits discussed and monitors and equipment checked  Epidural Patient position: sitting Prep: Betadine Patient monitoring: heart rate, continuous pulse ox and blood pressure Approach: midline Location: L4-L5 Injection technique: LOR saline  Needle:  Needle type: Tuohy  Needle gauge: 18 G Needle length: 9 cm and 9 Needle insertion depth: 6 cm Catheter type: closed end flexible Catheter size: 20 Guage Catheter at skin depth: 10 cm Test dose: negative (0.125% bupivacaine)  Assessment Events: blood not aspirated, injection not painful, no injection resistance, negative IV test and no paresthesia  Additional Notes   Patient tolerated the insertion well without complications.Reason for block:procedure for pain

## 2017-02-16 NOTE — Progress Notes (Signed)
Updated Dr. Leonides Schanz on pt progress.  Aware of SVE, ctx pattern q 1-2 minutes currently with pitocin decrease.  Aware that ctx are moderate to palpation and FHR is reactive.  Patient afebrile and with no complaints.  Orders to stop pitocin for 30 minutes and restart at half the current rate.

## 2017-02-16 NOTE — Discharge Summary (Signed)
Obstetrical Discharge Summary  Patient Name: [name] DOB: May 23, 1980 MRN: [id number]  Date of Admission: 02/15/2017 Date of Delivery: 02/16/17 Delivered by: Larey Days, MD Date of Discharge: 02/18/2017  Primary OB: [facility]   JSE:GBTDVVO'H last menstrual period was 05/10/2016 (exact date). EDC Estimated Date of Delivery: 02/14/17 Gestational Age at Delivery: [redacted]w[redacted]d   Antepartum complications:  Pregnancy Issues: 1. H/o gastric sleeve 2. Elevated Cu level during pregnancy 3. AMA 4. H/o depression 5. H/o migraines 6. H/o asthma  Admitting Diagnosis: induction of labor Secondary Diagnosis: Patient Active Problem List   Diagnosis Date Noted  . Labor and delivery indication for care or intervention 02/15/2017  . Pregnancy 01/01/2017  . Nausea and vomiting in pregnancy 11/06/2016  . Umbilical hernia without obstruction and without gangrene   . Norovirus 06/24/2016  . ADHD (attention deficit hyperactivity disorder) 06/20/2016  . Anxiety 06/20/2016  . Asthma without status asthmaticus 06/20/2016  . Depression 06/20/2016  . Fibrocystic breast disease 06/20/2016  . Fibromyalgia 06/20/2016  . Migraine 06/20/2016  . Ovarian cyst 06/20/2016  . Morbid obesity ([place]) 02/24/2014  . Headache 07/26/2013  . Pre-syncope 07/26/2013  . KNEE PAIN, BILATERAL 01/02/2010  . TENDINITIS, PATELLAR 01/02/2010    Augmentation: Pitocin and Cytotec Complications: None Intrapartum complications/course:  Mom presented to L&D with IOL for past due date.  She was given cytotec, spontaneously ruptured, then pitocin.  epidual placed. Progressed to complete, second stage with moderate maternal effort and exhaustion.  Vacuum offered.  Flat Kiwi placed at estimated vertex, engaged during pushing and released during rest. One pop-off, two contractions until  delivery of fetal head via maternal efforts with restitution to . Anterior then posterior shoulders delivered without difficulty.  Baby  placed on mom's chest, and attended to by peds.  Cord was then clamped and cut when pulseless, after >60 seconds.  Placenta spontaneously delivered, intact.   IV pitocin given for hemorrhage prophylaxis.  2nd degree repaired in standard fashion. There was still brisk bleeding, thus IM methergine was given.  No improvement with this intervention, and a distal vaginal tear was noted on the right, and repaired with modified figure of eight.  Hemostasis noted.   Delivery Type: vacuum, outlet Anesthesia: epidural Placenta: sponatneous Laceration: 2nd degree perineal and left vaginal  Episiotomy: none Newborn Data: Live born male "[name]" Birth Weight:  6-11lbs APGAR: 8, 9   Newborn Delivery   Birth date/time:  02/16/2017 19:26:00 Delivery type:  Vaginal, Vacuum (Extractor)     Postpartum Procedures: None  Post partum course:  Patient had an uncomplicated postpartum course.  By time of discharge on PPD#2, her pain was controlled on oral pain medications; she had appropriate lochia and was ambulating, voiding without difficulty and tolerating regular diet.  She was deemed stable for discharge to home.      Discharge Physical Exam:  BP 113/67 (BP Location: Left Arm)   Pulse 97   Temp 97.8 F (36.6 C) (Oral)   Resp 18   Ht 5\' 10"  (1.778 m)   Wt 98 kg (216 lb)   LMP 05/10/2016 (Exact Date)   SpO2 100%   BMI 30.99 kg/m   General: NAD CV: RRR Pulm: CTABL, nl effort ABD: s/nd/nt, fundus firm and below the umbilicus Lochia: moderate DVT Evaluation: LE non-ttp, no evidence of DVT on exam.  Hemoglobin  Date Value Ref Range Status  02/17/2017 11.5 (L) 12.0 - 16.0 g/dL Final   HGB  Date Value Ref Range Status  01/08/2014 14.3  12.0 - 16.0 g/dL Final   HCT  Date Value Ref Range Status  02/17/2017 32.8 (L) 35.0 - 47.0 % Final  01/08/2014 43.3 35.0 - 47.0 % Final     Disposition: stable, discharge to home Baby Feeding: breastmilk Baby Disposition: home with  mom  Rh Immune globulin given: n/a Rubella vaccine given: n/a Tdap vaccine given in AP or PP setting: AP Flu vaccine given in AP or PP setting: AP  Contraception: cisgender same sex partner  Prenatal Labs:  Blood type/Rh O+  Antibody screen neg  Rubella Immune  Varicella Immune  RPR NR  HBsAg Neg  HIV NR  GC neg  Chlamydia neg  Genetic screening negative  1 hour GTT 117  3 hour GTT   GBS negative     Plan:  [name] was discharged to home in good condition. Follow-up appointment with Dr Leonides Schanz in 4-6 weeks.  Discharge Medications: Allergies as of 02/18/2017      Reactions   Penicillins Anaphylaxis   Chlorhexidine    Chlorhexidine Gluconate    Clonazepam    Indifference to life attitude   Dexamethasone Hives   Doxycycline Nausea And Vomiting   Severe vomiting   Fluoxetine Other (See Comments)   Suicidal thoughts   Nsaids    No nsaids due to gastric surgery   Other    CHLORAHEXADINE   Hydrocodone-acetaminophen Hives, Rash   Tape Rash   Tuberculin Ppd Rash      Medication List    TAKE these medications   calcium gluconate 500 MG tablet Take 1 tablet by mouth 3 (three) times daily.   cholecalciferol 1000 units tablet Commonly known as:  VITAMIN D Take 1,000 Units by mouth daily.   prenatal multivitamin Tabs tablet Take 1 tablet by mouth daily at 12 noon.   SUPER B COMPLEX/VITAMIN C PO Take 1 tablet by mouth daily.         SignedLisette Grinder CNM 02/18/2017 11:31 AM

## 2017-02-16 NOTE — Progress Notes (Signed)
Dr. Leonides Schanz notified of pt SVE.  Aware of ctx pattern spacing out and pitocin being adjusted accordingly.  Aware that patient is afebrile and fetal heart tracing reassuring.  Will continue to monitor.

## 2017-02-16 NOTE — Anesthesia Preprocedure Evaluation (Signed)
Anesthesia Evaluation  Patient identified by MRN, date of birth, ID band Patient awake    Reviewed: Allergy & Precautions, H&P , NPO status , Patient's Chart, lab work & pertinent test results, reviewed documented beta blocker date and time   History of Anesthesia Complications Negative for: history of anesthetic complications  Airway Mallampati: I  TM Distance: >3 FB Neck ROM: full    Dental  (+) Caps, Teeth Intact   Pulmonary neg shortness of breath, asthma , neg sleep apnea, neg COPD, Recent URI , Residual Cough, former smoker,           Cardiovascular Exercise Tolerance: Good negative cardio ROS       Neuro/Psych  Headaches, neg Seizures PSYCHIATRIC DISORDERS  Neuromuscular disease    GI/Hepatic Neg liver ROS, GERD  ,  Endo/Other  negative endocrine ROS  Renal/GU negative Renal ROS  negative genitourinary   Musculoskeletal   Abdominal   Peds  Hematology negative hematology ROS (+)   Anesthesia Other Findings Past Medical History: No date: Asthma No date: Breast cyst No date: Breast mass No date: Depression No date: Fibrocystic breast No date: Fibromyalgia No date: Migraines   Reproductive/Obstetrics (+) Pregnancy                             Anesthesia Physical Anesthesia Plan  ASA: II  Anesthesia Plan: Epidural   Post-op Pain Management:    Induction:   PONV Risk Score and Plan:   Airway Management Planned:   Additional Equipment:   Intra-op Plan:   Post-operative Plan:   Informed Consent: I have reviewed the patients History and Physical, chart, labs and discussed the procedure including the risks, benefits and alternatives for the proposed anesthesia with the patient or authorized representative who has indicated his/her understanding and acceptance.   Dental Advisory Given  Plan Discussed with: Anesthesiologist, CRNA and Surgeon  Anesthesia Plan Comments:          Anesthesia Quick Evaluation

## 2017-02-16 NOTE — Progress Notes (Signed)
Intrapartum update/summary  Patient s/p cytotec x 2 then pitocin, SROM, pitocin rest then restarted   Currently comfotable with epidural BP 122/71   Pulse 68   Temp 98 F (36.7 C) (Oral)   Resp 20   Ht 5\' 10"  (1.778 m)   Wt 98 kg (216 lb)   LMP 05/10/2016 (Exact Date)   SpO2 100%   BMI 30.99 kg/m   130 mod +accels no decels q 30min ctx sve by RN 3/90/-2  A/P: 36yo at 40+2 woth IOL for past duedat Category 1 Continue pitocin   Slow progress, but progress.     ----- Larey Days, MD Attending Obstetrician and Gynecologist [facility], Department of [facility]

## 2017-02-16 NOTE — Plan of Care (Signed)
POC discussed with Pt. And she agrees. Transferred to room 351 from L&D. Alert and oriented with cheerful affect. Color good, skin w&d. Fundus is firm at U/E and Lochial flow is small. Denies c/o and is bonding well with Infant.

## 2017-02-17 LAB — CBC
HEMATOCRIT: 32.8 % — AB (ref 35.0–47.0)
HEMOGLOBIN: 11.5 g/dL — AB (ref 12.0–16.0)
MCH: 30.8 pg (ref 26.0–34.0)
MCHC: 35.2 g/dL (ref 32.0–36.0)
MCV: 87.8 fL (ref 80.0–100.0)
Platelets: 186 10*3/uL (ref 150–440)
RBC: 3.74 MIL/uL — ABNORMAL LOW (ref 3.80–5.20)
RDW: 13.3 % (ref 11.5–14.5)
WBC: 10.2 10*3/uL (ref 3.6–11.0)

## 2017-02-17 NOTE — Anesthesia Postprocedure Evaluation (Signed)
Anesthesia Post Note  Patient: [name]  Procedure(s) Performed: AN AD HOC LABOR EPIDURAL  Patient location during evaluation: Mother Baby Anesthesia Type: Epidural Level of consciousness: awake and alert and oriented Pain management: pain level controlled Vital Signs Assessment: post-procedure vital signs reviewed and stable Respiratory status: spontaneous breathing Cardiovascular status: stable Postop Assessment: no headache, no apparent nausea or vomiting and adequate PO intake Anesthetic complications: no     Last Vitals:  Vitals:   02/17/17 0000 02/17/17 0326  BP: (!) 123/56 116/65  Pulse: 62 (!) 59  Resp: 20 18  Temp: 36.5 C 36.5 C  SpO2: 97% 100%    Last Pain:  Vitals:   02/17/17 0624  TempSrc:   PainSc: Yolanda Manges

## 2017-02-17 NOTE — Lactation Note (Signed)
This note was copied from a baby's chart. Lactation Consultation Note  Patient Name: [name] Today's Date: 02/17/2017   Arlee Muslim was falling asleep at the breast and mom was concerned that she was not taking enough milk while at the breast.  Assisted mom with feeding demonstrating waking techniques to keep her actively sucking while she was at the breast.  Demonstrated how to hand express and was able to easily express colostrum. Reviewed normal course of lactation and routine newborn feeding patterns.  Lactation name and number written on white board and encouraged to call for questions, concerns or assistance.    Maternal Data    Feeding Feeding Type: Breast Fed Length of feed: 25 min(per I&O sheet)  LATCH Score                   Interventions    Lactation Tools Discussed/Used     Consult Status      Jarold Motto 02/17/2017, 8:48 PM

## 2017-02-17 NOTE — Progress Notes (Signed)
Post Partum Day 1 Subjective: no complaints  Objective: Blood pressure 115/64, pulse 86, temperature 98.1 F (36.7 C), temperature source Oral, resp. rate 18, height 5\' 10"  (1.778 m), weight 98 kg (216 lb), last menstrual period 05/10/2016, SpO2 100 %.  Physical Exam:  General: alert and cooperative Lochia: appropriate Uterine Fundus: firm Incision: n/a DVT Evaluation: No evidence of DVT seen on physical exam.  Recent Labs    02/15/17 0831 02/17/17 0529  HGB 13.0 11.5*  HCT 38.2 32.8*    Assessment/Plan: Plan for discharge tomorrow   LOS: 2 days   [name] [name] 02/17/2017, 3:23 PM

## 2017-02-18 NOTE — Progress Notes (Signed)
Patient discharged home with infant. Discharge instructions, prescriptions and follow up appointment given to and reviewed with patient. Patient verbalized understanding. Patient wheeled out by auxiliary with infant.

## 2017-02-18 NOTE — Plan of Care (Signed)
Pt VSS; up ad lib; did ambulate in hallway this shift; breastfeeding baby; taking tylenol for pain

## 2017-02-18 NOTE — Discharge Instructions (Signed)
After Your Delivery Discharge Instructions   Postpartum: Care Instructions  After childbirth (postpartum period), your body goes through many changes. Some of these changes happen over several weeks. In the hours after delivery, your body will begin to recover from childbirth while it prepares to breastfeed your newborn. You may feel emotional during this time. Your hormones can shift your mood without warning for no clear reason.  In the first couple of weeks after childbirth, many women have emotions that change from happy to sad. You may find it hard to sleep. You may cry a lot. This is called the "baby blues." These overwhelming emotions often go away within a couple of days or weeks. But it's important to discuss your feelings with your doctor.  You should call your care provider if you have unrelieved feelings of:  Inability to cope  Sadness  Anxiety  Lack of interest in baby  Insomnia  Crying  It is easy to get too tired and overwhelmed during the first weeks after childbirth. Don't try to do too much. Get rest whenever you can, accept help from others, and eat well and drink plenty of fluids.  About 4 to 6 weeks after your baby's birth, you will have a follow-up visit with your care provider. This visit is your time to talk to your provider about anything you are concerned or curious about.  Follow-up care is a [place] part of your treatment and safety. Be sure to make and go to all appointments, and call your doctor if you are having problems. It's also a good idea to know your test results and keep a list of the medicines you take.  How can you care for yourself at home?  Sleep or rest when your baby sleeps.  Get help with household chores from family or friends, if you can. Do not try to do it all yourself.  If you have hemorrhoids or swelling or pain around the opening of your vagina, try using cold and heat. You can put ice or a cold pack on the area for 10 to 20 minutes at  a time. Put a thin cloth between the ice and your skin. Also try sitting in a few inches of warm water (sitz bath) 3 times a day and after bowel movements.  Take pain medicines exactly as directed.  If the doctor gave you a prescription medicine for pain, take it as prescribed.  If you are not taking a prescription pain medicine, ask your doctor if you can take an over-the-counter medicine.  No driving for 1-2 weeks or while taking pain medications.   Eat more fiber to avoid constipation. Include foods such as whole-grain breads and cereals, raw vegetables, raw and dried fruits, and beans.  Drink plenty of fluids, enough so that your urine is light yellow or clear like water. If you have kidney, heart, or liver disease and have to limit fluids, talk with your doctor before you increase the amount of fluids you drink.  Do not put anything in the vagina for 6 weeks. This means no sex, no tampons, no douching, and no enemas.  If you have stitches, keep the area clean by pouring or spraying warm water over the area outside your vagina and anus after you use the toilet.  No strenuous activity or heavy lifting for 6 weeks   No tub baths; showers only  Continue prenatal vitamin and iron.  If breastfeeding:  Increase calories and fluids while breastfeeding.  You may have  a slight fever when your milk comes in, but it should go away on its own. If it does not, and rises above 101.0 please call the doctor.  For breastfeeding concerns, the lactation consultant can be reached at [phone].  For concerns about your baby, please call your pediatrician.   Keep a list of questions to bring to your postpartum visit. Your questions might be about:  Changes in your breasts, such as lumps or soreness.  When to expect your menstrual period to start again.  What form of birth control is best for you.  Weight you have put on during the pregnancy.  Exercise options.  What foods and drinks  are best for you, especially if you are breastfeeding.  Problems you might be having with breastfeeding.  When you can have sex. Some women may want to talk about lubricants for the vagina.  Any feelings of sadness or restlessness that you are having.   When should you call for help?  Call 911 anytime you think you may need emergency care. For example, call if:  You have thoughts of harming yourself, your baby, or another person.  You passed out (lost consciousness).  Call your care provider now or seek immediate medical care if:  If you have heavy bleeding such that you are soaking 1 pad in an hour for 2 hours  You are dizzy or lightheaded, or you feel like you may faint.  You have a fever; a temperature of 101.0 F or greater  Chills  Difficulty urinating  Headache unrelieved by "pain meds"   Visual changes  Pain in the right side of your belly near your ribs  Breasts reddened, hard, hot to the touch or any other breast concerns  Nipple discharge which is foul-smelling or contains pus   Increased pain at the site of the tear   New pain unrelieved with recommended over-the-counter dosages  Difficulty breathing with or without chest pain   New leg pain, swelling, or redness, especially if it is only on one leg  Any other concerns  Watch closely for changes in your health, and be sure to contact your provider if:  You have new or worse vaginal discharge.  You feel sad or depressed.  You are having problems with your breasts or breastfeeding.

## 2017-02-26 ENCOUNTER — Ambulatory Visit
Admission: RE | Admit: 2017-02-26 | Discharge: 2017-02-26 | Disposition: A | Discharge status: Home / Self Care | Payer: BC Managed Care – PPO | Source: Ambulatory Visit | Attending: Obstetrics and Gynecology | Admitting: Obstetrics and Gynecology

## 2017-02-26 NOTE — Lactation Note (Signed)
Lactation Consultation Note  Patient Name: [name] Date: 02/26/2017   Ob-Gyn referred Mom for consult R/T poor latch and plugged ducts vs mastitis. This [name] (her wife is her support) c/o sore latch since the beginning. 4 days ago, she had red lump on side of red breast, but NO fever, chills, body aches, headaches or other signs of mastitis. Mom says she was started on Clindamycin ([facility] [place])  for "mastitis". Mom reports having a significant history of "bad yeast infections when on antibiotics. She denies any symptoms of Thrush now, and I don't see any in her or baby. Since she said she planned to speak with Dr. Leafy Ro soon any way, I encouraged her to discuss the possibility of her not really having had mastitis or the need for antibiotics. Meanwhile, keep taking what is prescribed by MD. (Being off abx if not needed will help avoid Thrush). The lump on lateral side of breast is soft and apprx size of almond. No redness, faint pink maybe. I do see some white blebs on face of each nipple, mostly right side from clogged milk. Increasing fluids, decreasing salt in diet, possible lecitihin supp, heat, massage, frequent nursing to help empty breast should help clear it after a few days. If not, call back.   Meanwhile during feed, Mom needed adjustment of position and latch as her technique caused baby to just get nipple. With corrections and flanging lips, she said it felt "great!" Breast compression and stim of baby helped her to empty breasts well, but needed burping and switching positions in between. She took in a total of 30ml and had a heavy wet diaper. Mom says she has been gaining well recently with 6 oz gain in a few days at MD office.   Today's weight (nude) 6lb 6.1oz  PLAN:  Continue to feed often per cues with careful position and latch (mom demo'd it well in office): Ask MD about abx: decrease salt intake (and sugar to prevent Thrush), increase water intake; no underwire  bras; shells for tender nipples til healed: If any symptoms increase, call back for F/U. Please consider attending [facility].       Maternal Data    Feeding    LATCH Score                   Interventions    Lactation Tools Discussed/Used     Consult Status      [name] 02/26/2017, 3:02 PM

## 2017-03-12 ENCOUNTER — Ambulatory Visit
Admission: RE | Admit: 2017-03-12 | Discharge: 2017-03-12 | Disposition: A | Discharge status: Home / Self Care | Payer: BC Managed Care – PPO | Source: Ambulatory Visit | Attending: Obstetrics and Gynecology | Admitting: Obstetrics and Gynecology

## 2017-03-12 DIAGNOSIS — N6312 Unspecified lump in the right breast, upper inner quadrant: Secondary | ICD-10-CM | POA: Insufficient documentation

## 2017-03-12 DIAGNOSIS — N6314 Unspecified lump in the right breast, lower inner quadrant: Secondary | ICD-10-CM | POA: Diagnosis not present

## 2017-03-12 DIAGNOSIS — N6310 Unspecified lump in the right breast, unspecified quadrant: Secondary | ICD-10-CM | POA: Diagnosis present

## 2017-05-19 ENCOUNTER — Ambulatory Visit
Admission: RE | Admit: 2017-05-19 | Discharge: 2017-05-19 | Disposition: A | Discharge status: Home / Self Care | Payer: BC Managed Care – PPO | Source: Ambulatory Visit | Attending: Pediatrics | Admitting: Pediatrics

## 2017-05-19 DIAGNOSIS — N6310 Unspecified lump in the right breast, unspecified quadrant: Secondary | ICD-10-CM | POA: Insufficient documentation

## 2017-05-19 DIAGNOSIS — O9279 Other disorders of lactation: Secondary | ICD-10-CM | POA: Insufficient documentation

## 2017-05-19 NOTE — Lactation Note (Signed)
Lactation Consultation Note  Patient Name: [name] [id number] Date: 05/19/2017     Maternal Data  Pt has been complaining of engorgement and frequency of breasts filling up too quickly between pumping and nursing sessions. Pt has a mass under her rt breast and last nursed baby 2hrs before coming. Pt pumps q2-3hrs approx 6-7oz from both breasts. She desires to do more nursing from breasts in order to cut down on foremilk inbalance for baby.   Feeding  Baby latched onto pt's right breast with nose pointed in the direction of the mass. Baby was able to soften breast, but not completely. Infant consumed 74mL in 36mins. Baby then got gassy and came off which pt states happens oftern.   LATCH Score  10                 Interventions  DEBP  Lactation Tools Discussed/Used   DEBP and slow flow bottle   Consult Status   PT is to discontinue nursing and pumping immediately afterwards until breasts are empty. Pt is to now nurse baby (after giving gas drops) and if nursing doesn't go well, supplement with own milk until baby is satisfied. Baby took 6oz from bottle after pt used DEBP. PT is to pump until breasts feel comfortable, in order to decrease milk supply and regulate production. If pt finds she is still overproducing, blocked feeds are recommended.    [name] 05/19/2017, 10:21 PM

## 2017-07-24 ENCOUNTER — Ambulatory Visit
Admission: RE | Admit: 2017-07-24 | Discharge: 2017-07-24 | Disposition: A | Discharge status: Home / Self Care | Payer: BC Managed Care – PPO | Source: Ambulatory Visit | Attending: Obstetrics & Gynecology | Admitting: Obstetrics & Gynecology

## 2017-07-24 DIAGNOSIS — Z029 Encounter for administrative examinations, unspecified: Secondary | ICD-10-CM | POA: Insufficient documentation

## 2017-07-24 NOTE — Lactation Note (Signed)
Lactation Consultation Note  Patient Name: [name] Date: 07/24/2017     Maternal Data  Pt. complains of lump in breast due to a clogged duct on rt side and there is a milk blister on left side. Pt states this appeared on Monday. Pt. Says she has been eating fattier foods lately and not eating well due to hx of weight issues  Feeding  0 baby present/pumping only  LATCH Score  0                 Interventions  lecithin and massage before pumping using 31mm flanges  Lactation Tools Discussed/Used  same as above   Consult Status  PT is to pump using 108mm flanges and begin decreasing fatty foods from diet and taking lecithin if necessary to assist with reoccuring clogged ducts leading to engorgement.     [name] 07/24/2017, 2:30 PM

## 2017-10-13 DIAGNOSIS — S83006A Unspecified dislocation of unspecified patella, initial encounter: Secondary | ICD-10-CM | POA: Insufficient documentation

## 2017-12-15 ENCOUNTER — Ambulatory Visit
Admission: RE | Admit: 2017-12-15 | Discharge: 2017-12-15 | Disposition: A | Discharge status: Home / Self Care | Payer: BC Managed Care – PPO | Source: Ambulatory Visit | Attending: Obstetrics & Gynecology | Admitting: Obstetrics & Gynecology

## 2017-12-15 DIAGNOSIS — R633 Feeding difficulties: Secondary | ICD-10-CM | POA: Diagnosis not present

## 2017-12-15 NOTE — Lactation Note (Signed)
Lactation Consultation Note  Patient Name: [name] [id number] Date: 12/15/2017     Maternal Data  Parent has been under a lot of stress recently and hasn't been able to pump as often as usual at work. Milk supply has decreased, but baby is now 34mos old and has taken well to solid foods. Parent wants to ensure baby is still getting what she needs.  Feeding  [name] nursed on parent's right side for 75mins and unlatched herself. Baby transferred [place] during that time according to the pre/post weight scale.   LATCH Score  10                 Interventions  n/a  Lactation Tools Discussed/Used  n/a   Consult Status  LC informed parent that milk supply is adequate for baby's age and how often baby is nursing. Baby is eating more solids and is getting what she needs from them while still being complimented by mom's milk.      [name] 12/15/2017, 10:59 AM

## 2017-12-21 ENCOUNTER — Other Ambulatory Visit: Payer: Self-pay

## 2017-12-21 ENCOUNTER — Emergency Department
Admission: EM | Admit: 2017-12-21 | Discharge: 2017-12-21 | Disposition: A | Discharge status: Home / Self Care | Payer: BC Managed Care – PPO | Attending: Emergency Medicine | Admitting: Emergency Medicine

## 2017-12-21 DIAGNOSIS — B86 Scabies: Secondary | ICD-10-CM | POA: Diagnosis not present

## 2017-12-21 DIAGNOSIS — Z87891 Personal history of nicotine dependence: Secondary | ICD-10-CM | POA: Insufficient documentation

## 2017-12-21 DIAGNOSIS — J45909 Unspecified asthma, uncomplicated: Secondary | ICD-10-CM | POA: Diagnosis not present

## 2017-12-21 DIAGNOSIS — R21 Rash and other nonspecific skin eruption: Secondary | ICD-10-CM | POA: Diagnosis present

## 2017-12-21 DIAGNOSIS — Z79899 Other long term (current) drug therapy: Secondary | ICD-10-CM | POA: Insufficient documentation

## 2017-12-21 NOTE — ED Triage Notes (Signed)
Reports noticed rash that comes and goes for several days.

## 2017-12-21 NOTE — ED Provider Notes (Signed)
[facility] Emergency Department Provider Note  ____________________________________________   First MD Initiated Contact with Patient 12/21/17 0154     (approximate)  I have reviewed the triage vital signs and the nursing notes.   HISTORY  Chief Complaint Rash   HPI [name] is a 37 y.o. male who comes to the emergency department with several days of severe itching rash across her hands legs and abdomen.  Symptoms are worse at night.  No one else at home is similar symptoms.  The patient's son had scabies recently and mom gave herself a permethrin treatment last night which seemed to have improved her symptoms.  No fevers or chills.  No chest pain or shortness of breath.  No mucous membrane involvement.   Past Medical History:  Diagnosis Date  . Asthma   . Breast cyst   . Breast mass   . Depression   . Fibrocystic breast   . Fibromyalgia   . Migraines     Patient Active Problem List   Diagnosis Date Noted  . Labor and delivery indication for care or intervention 02/15/2017  . Pregnancy 01/01/2017  . Nausea and vomiting in pregnancy 11/06/2016  . Umbilical hernia without obstruction and without gangrene   . Norovirus 06/24/2016  . ADHD (attention deficit hyperactivity disorder) 06/20/2016  . Anxiety 06/20/2016  . Asthma without status asthmaticus 06/20/2016  . Depression 06/20/2016  . Fibrocystic breast disease 06/20/2016  . Fibromyalgia 06/20/2016  . Migraine 06/20/2016  . Ovarian cyst 06/20/2016  . Morbid obesity ([place]) 02/24/2014  . Headache 07/26/2013  . Pre-syncope 07/26/2013  . KNEE PAIN, BILATERAL 01/02/2010  . TENDINITIS, PATELLAR 01/02/2010    Past Surgical History:  Procedure Laterality Date  . BUNIONECTOMY Bilateral   . EYE SURGERY     blood clot in eye  . HERNIA REPAIR  1991, 2001   inguinal and umbilical  . LAPAROSCOPIC GASTRIC SLEEVE RESECTION  2015    Prior to Admission medications   Medication Sig Start  Date End Date Taking? Authorizing Provider  B Complex-C (SUPER B COMPLEX/VITAMIN C PO) Take 1 tablet by mouth daily.     [provider]  calcium gluconate 500 MG tablet Take 1 tablet by mouth 3 (three) times daily.    [provider]  cholecalciferol (VITAMIN D) 1000 units tablet Take 1,000 Units by mouth daily.    [provider]  Prenatal Vit-Fe Fumarate-FA (PRENATAL MULTIVITAMIN) TABS tablet Take 1 tablet by mouth daily at 12 noon.    [provider]    Allergies Penicillins; Chlorhexidine; Chlorhexidine gluconate; Clonazepam; Dexamethasone; Doxycycline; Fluoxetine; Nsaids; Other; Hydrocodone-acetaminophen; Tape; and Tuberculin ppd  Family History  Problem Relation Age of Onset  . Breast cancer Paternal Aunt   . Breast cancer Paternal Aunt   . Healthy Mother   . CVA Father   . Hypertension Father   . Diabetes Father   . Breast cancer Paternal Grandmother     Social History Social History   Tobacco Use  . Smoking status: Former Smoker    Last attempt to quit: 06/21/2003    Years since quitting: 14.5  . Smokeless tobacco: Never Used  Substance Use Topics  . Alcohol use: No  . Drug use: No    Review of Systems Constitutional: No fever/chills ENT: No sore throat. Cardiovascular: Denies chest pain. Respiratory: Denies shortness of breath. Gastrointestinal: No abdominal pain.  No nausea, no vomiting.   Skin: Positive for rash Neurological: Negative for headaches  ____________________________________________   PHYSICAL EXAM:  VITAL SIGNS: ED Triage Vitals  Enc Vitals Group     BP 12/21/17 0026 126/84     Pulse Rate 12/21/17 0026 84     Resp 12/21/17 0026 18     Temp 12/21/17 0026 97.7 F (36.5 C)     Temp Source 12/21/17 0026 Oral     SpO2 12/21/17 0026 98 %     Weight 12/21/17 0025 219 lb (99.3 kg)     Height 12/21/17 0025 5\' 10"  (1.778 m)     Head Circumference --      Peak Flow --      Pain Score 12/21/17 0025 0      Pain Loc --      Pain Edu? --      Excl. in [place]? --     Constitutional: Alert and oriented x4 well-appearing nontoxic no diaphoresis speaks full clear sentences Head: Atraumatic. Nose: No congestion/rhinnorhea. Mouth/Throat: No trismus no oral lesions Neck: No stridor.   Cardiovascular: Regular rate and rhythm Respiratory: Normal respiratory effort.  No retractions. Gastrointestinal: Soft nontender Neurologic:  Normal speech and language. No gross focal neurologic deficits are appreciated.  Skin: Multiple small lesions which appear to be scabies on her arms legs and upper abdomen    ____________________________________________  LABS (all labs ordered are listed, but only abnormal results are displayed)  Labs Reviewed - No data to display   __________________________________________  EKG   ____________________________________________  RADIOLOGY   ____________________________________________   DIFFERENTIAL includes but not limited to  Scabies, bedbugs, hand-foot-and-mouth disease   PROCEDURES  Procedure(s) performed: no  Procedures  Critical Care performed: no  ____________________________________________   INITIAL IMPRESSION / ASSESSMENT AND PLAN / ED COURSE  Pertinent labs & imaging results that were available during my care of the patient were reviewed by me and considered in my medical decision making (see chart for details).   As part of my medical decision making, I reviewed the following data within the [place] History obtained from family if available, nursing notes, old chart and ekg, as well as notes from prior ED visits.  The patient does appear to have scabies however mom has already appropriately treated herself at home.  Discussed that they are extremely contagious and she understands to wash all linens and clothes in hot water.  Discharged home in improved condition.       ____________________________________________   FINAL CLINICAL IMPRESSION(S) / ED DIAGNOSES  Final diagnoses:  Scabies      NEW MEDICATIONS STARTED DURING THIS VISIT:  Discharge Medication List as of 12/21/2017  2:34 AM       Note:  This document was prepared using Dragon voice recognition software and may include unintentional dictation errors.      Darel Hong, MD 12/23/17 [phone]

## 2017-12-21 NOTE — Discharge Instructions (Signed)
You did everything correctly with the permethrin treatment at home.  Please make sure you wash all of your linens in hot water and follow up with your PMD as needed.  Take 10mg  of over the counter zyrtec (cetirizine) FOUR TIMES A DAY to help with the itching.  It was a pleasure to take care of you today, and thank you for coming to our emergency department.  If you have any questions or concerns before leaving please ask the nurse to grab me and I'm more than happy to go through your aftercare instructions again.  If you have any concerns once you are home that you are not improving or are in fact getting worse before you can make it to your follow-up appointment, please do not hesitate to call 911 and come back for further evaluation.  Darel Hong, MD

## 2017-12-21 NOTE — ED Notes (Signed)
Pt reports she has a rash that developed Thursday. Seen by PCP and told to use benadryl. Rash has improved but pt reports usually worse at night. No rash noted at this time.

## 2018-02-22 ENCOUNTER — Other Ambulatory Visit: Payer: Self-pay

## 2018-02-22 ENCOUNTER — Emergency Department
Admission: EM | Admit: 2018-02-22 | Discharge: 2018-02-23 | Disposition: A | Discharge status: Home / Self Care | Payer: BC Managed Care – PPO | Attending: Emergency Medicine | Admitting: Emergency Medicine

## 2018-02-22 DIAGNOSIS — Z87891 Personal history of nicotine dependence: Secondary | ICD-10-CM | POA: Insufficient documentation

## 2018-02-22 DIAGNOSIS — F909 Attention-deficit hyperactivity disorder, unspecified type: Secondary | ICD-10-CM | POA: Diagnosis not present

## 2018-02-22 DIAGNOSIS — E860 Dehydration: Secondary | ICD-10-CM | POA: Insufficient documentation

## 2018-02-22 DIAGNOSIS — R197 Diarrhea, unspecified: Secondary | ICD-10-CM | POA: Insufficient documentation

## 2018-02-22 DIAGNOSIS — J45909 Unspecified asthma, uncomplicated: Secondary | ICD-10-CM | POA: Diagnosis not present

## 2018-02-22 DIAGNOSIS — Z79899 Other long term (current) drug therapy: Secondary | ICD-10-CM | POA: Diagnosis not present

## 2018-02-22 LAB — CBC
HCT: 43.5 % (ref 36.0–46.0)
Hemoglobin: 14.4 g/dL (ref 12.0–15.0)
MCH: 29 pg (ref 26.0–34.0)
MCHC: 33.1 g/dL (ref 30.0–36.0)
MCV: 87.5 fL (ref 80.0–100.0)
NRBC: 0 % (ref 0.0–0.2)
Platelets: 286 10*3/uL (ref 150–400)
RBC: 4.97 MIL/uL (ref 3.87–5.11)
RDW: 12.3 % (ref 11.5–15.5)
WBC: 4.5 10*3/uL (ref 4.0–10.5)

## 2018-02-22 LAB — COMPREHENSIVE METABOLIC PANEL
ALBUMIN: 4.2 g/dL (ref 3.5–5.0)
ALT: 16 U/L (ref 0–44)
AST: 24 U/L (ref 15–41)
Alkaline Phosphatase: 86 U/L (ref 38–126)
Anion gap: 7 (ref 5–15)
BUN: 9 mg/dL (ref 6–20)
CHLORIDE: 106 mmol/L (ref 98–111)
CO2: 24 mmol/L (ref 22–32)
CREATININE: 0.74 mg/dL (ref 0.44–1.00)
Calcium: 8.7 mg/dL — ABNORMAL LOW (ref 8.9–10.3)
GFR calc non Af Amer: >60 mL/min (ref >60)
GLUCOSE: 96 mg/dL (ref 70–99)
Potassium: 3.2 mmol/L — ABNORMAL LOW (ref 3.5–5.1)
SODIUM: 137 mmol/L (ref 135–145)
Total Bilirubin: 0.4 mg/dL (ref 0.3–1.2)
Total Protein: 7.8 g/dL (ref 6.5–8.1)

## 2018-02-22 LAB — LIPASE, BLOOD: LIPASE: 37 U/L (ref 11–51)

## 2018-02-22 MED ORDER — LOPERAMIDE HCL 2 MG PO CAPS
4.0000 mg | ORAL_CAPSULE | Freq: Once | ORAL | Status: AC
  Administered 2018-02-22: 4 mg via ORAL
  Filled 2018-02-22: qty 2

## 2018-02-22 MED ORDER — SODIUM CHLORIDE 0.9 % IV SOLN
Freq: Once | INTRAVENOUS | Status: AC
  Administered 2018-02-22: via INTRAVENOUS

## 2018-02-22 MED ORDER — CIPROFLOXACIN HCL 500 MG PO TABS: 500.0000 mg | ORAL_TABLET | Freq: Two times a day (BID) | ORAL | 0 refills | Status: AC

## 2018-02-22 MED ORDER — SODIUM CHLORIDE 0.9 % IV BOLUS: 1000.0000 mL | Freq: Once | INTRAVENOUS | Status: DC

## 2018-02-22 NOTE — ED Notes (Signed)
Pt c/o abd swelling since arrival; pt laying in recliner in subwait;

## 2018-02-22 NOTE — ED Provider Notes (Signed)
[facility] Emergency Department Provider Note  ____________________________________________   None    (approximate)  I have reviewed the triage vital signs and the nursing notes.   HISTORY  Chief Complaint Diarrhea   HPI [name] is a 37 y.o. male who self presents to the emergency department with 5 to 6 days of loose stools.  She has never had watery diarrhea but she is currently having upwards of 10 loose stools a day.  Associated with moderate severity diffuse cramping abdominal pain.  The pain is worse when defecating and improved thereafter.  She denies fevers or chills.  She denies nausea or vomiting.  She has had no recent antibiotics.  The patient feels generally "tired" and she is concerned she could be dehydrated.  She has had no recent international travel.  She does not eat shellfish.  No one else at home has similar symptoms.  Her symptoms seem to come on suddenly and have been slowly progressive.    Past Medical History:  Diagnosis Date  . Asthma   . Breast cyst   . Breast mass   . Depression   . Fibrocystic breast   . Fibromyalgia   . Migraines     Patient Active Problem List   Diagnosis Date Noted  . Labor and delivery indication for care or intervention 02/15/2017  . Pregnancy 01/01/2017  . Nausea and vomiting in pregnancy 11/06/2016  . Umbilical hernia without obstruction and without gangrene   . Norovirus 06/24/2016  . ADHD (attention deficit hyperactivity disorder) 06/20/2016  . Anxiety 06/20/2016  . Asthma without status asthmaticus 06/20/2016  . Depression 06/20/2016  . Fibrocystic breast disease 06/20/2016  . Fibromyalgia 06/20/2016  . Migraine 06/20/2016  . Ovarian cyst 06/20/2016  . Morbid obesity ([place]) 02/24/2014  . Headache 07/26/2013  . Pre-syncope 07/26/2013  . KNEE PAIN, BILATERAL 01/02/2010  . TENDINITIS, PATELLAR 01/02/2010    Past Surgical History:  Procedure Laterality Date  . BUNIONECTOMY  Bilateral   . EYE SURGERY     blood clot in eye  . HERNIA REPAIR  1991, 2001   inguinal and umbilical  . LAPAROSCOPIC GASTRIC SLEEVE RESECTION  2015    Prior to Admission medications   Medication Sig Start Date End Date Taking? Authorizing Provider  B Complex-C (SUPER B COMPLEX/VITAMIN C PO) Take 1 tablet by mouth daily.     [provider]  calcium gluconate 500 MG tablet Take 1 tablet by mouth 3 (three) times daily.    [provider]  cholecalciferol (VITAMIN D) 1000 units tablet Take 1,000 Units by mouth daily.    [provider]  ciprofloxacin (CIPRO) 500 MG tablet Take 1 tablet (500 mg total) by mouth 2 (two) times daily for 5 days. 02/22/18 02/27/18  Darel Hong, MD  Prenatal Vit-Fe Fumarate-FA (PRENATAL MULTIVITAMIN) TABS tablet Take 1 tablet by mouth daily at 12 noon.    [provider]    Allergies Penicillins; Chlorhexidine; Chlorhexidine gluconate; Clonazepam; Dexamethasone; Doxycycline; Fluoxetine; Nsaids; Other; Hydrocodone-acetaminophen; Tape; and Tuberculin ppd  Family History  Problem Relation Age of Onset  . Breast cancer Paternal Aunt   . Breast cancer Paternal Aunt   . Healthy Mother   . CVA Father   . Hypertension Father   . Diabetes Father   . Breast cancer Paternal Grandmother     Social History Social History   Tobacco Use  . Smoking status: Former Smoker    Last attempt to quit: 06/21/2003  Years since quitting: 14.6  . Smokeless tobacco: Never Used  Substance Use Topics  . Alcohol use: No  . Drug use: No    Review of Systems Constitutional: No fever/chills Eyes: No visual changes. ENT: No sore throat. Cardiovascular: Denies chest pain. Respiratory: Denies shortness of breath. Gastrointestinal: Positive for abdominal pain.  No nausea, no vomiting.  Positive for diarrhea.  No constipation. Genitourinary: Negative for dysuria. Musculoskeletal: Negative for back pain. Skin: Negative for  rash. Neurological: Negative for headaches, focal weakness or numbness.   ____________________________________________   PHYSICAL EXAM:  VITAL SIGNS: ED Triage Vitals  Enc Vitals Group     BP 02/22/18 2120 118/77     Pulse Rate 02/22/18 2120 88     Resp 02/22/18 2120 18     Temp 02/22/18 2120 97.8 F (36.6 C)     Temp Source 02/22/18 2120 Oral     SpO2 02/22/18 2120 96 %     Weight 02/22/18 2121 220 lb (99.8 kg)     Height 02/22/18 2121 5\' 10"  (1.778 m)     Head Circumference --      Peak Flow --      Pain Score 02/22/18 2121 6     Pain Loc --      Pain Edu? --      Excl. in [place]? --     Constitutional: Alert and oriented x4 appears tired although nontoxic no diaphoresis Eyes: PERRL EOMI. Head: Atraumatic. Nose: No congestion/rhinnorhea. Mouth/Throat: No trismus Neck: No stridor.   Cardiovascular: Normal rate, regular rhythm. Grossly normal heart sounds.  Good peripheral circulation. Respiratory: Normal respiratory effort.  No retractions. Lungs CTAB and moving good air Gastrointestinal: Soft diffuse mild tenderness with no rebound or guarding no peritonitis Musculoskeletal: No lower extremity edema   Neurologic:  Normal speech and language. No gross focal neurologic deficits are appreciated. Skin:  Skin is warm, dry and intact. No rash noted. Psychiatric: Mood and affect are normal. Speech and behavior are normal.    ____________________________________________   DIFFERENTIAL includes but not limited to  Bacterial diarrhea, viral diarrhea, inflammatory diarrhea, obstruction ____________________________________________   LABS (all labs ordered are listed, but only abnormal results are displayed)  Labs Reviewed  GASTROINTESTINAL PANEL BY PCR, STOOL (REPLACES STOOL CULTURE) - Abnormal; Notable for the following components:      Result Value   Sapovirus (I, II, IV, and V) DETECTED (*)    All other components within normal limits  COMPREHENSIVE METABOLIC PANEL -  Abnormal; Notable for the following components:   Potassium 3.2 (*)    Calcium 8.7 (*)    All other components within normal limits  URINALYSIS, COMPLETE (UACMP) WITH MICROSCOPIC - Abnormal; Notable for the following components:   Color, Urine YELLOW (*)    APPearance HAZY (*)    Hgb urine dipstick SMALL (*)    Ketones, ur 5 (*)    Bacteria, UA RARE (*)    All other components within normal limits  C DIFFICILE QUICK SCREEN W PCR REFLEX  LIPASE, BLOOD  CBC  HCG, QUANTITATIVE, PREGNANCY    Lab work reviewed by me is positive for sapovirus __________________________________________  EKG   ____________________________________________  RADIOLOGY   ____________________________________________   PROCEDURES  Procedure(s) performed: no  Procedures  Critical Care performed: no  ____________________________________________   INITIAL IMPRESSION / ASSESSMENT AND PLAN / ED COURSE  Pertinent labs & imaging results that were available during my care of the patient were reviewed by me and considered in my  medical decision making (see chart for details).   As part of my medical decision making, I reviewed the following data within the [place] History obtained from family if available, nursing notes, old chart and ekg, as well as notes from prior ED visits.  Patient comes to the emergency department with 5 to 6 days of loose stools.  She has had no recent antibiotics and no fevers.  She is very low risk for C. difficile.  Lab work obtained and IV fluids giving given the duration of her symptoms and my concern for dehydration or metabolic derangement.  She has not taken Imodium so I gave her 4 mg now and discussed that she can take up to 60 mg a day.  Given the duration of her symptoms I do think is reasonable to give a trial of oral antibiotics in case this is bacterial.  She is breast-feeding and unfortunately neither ciprofloxacin nor sulfa are safe while  breast-feeding.  Her daughter does eat and is able to also have formula so the patient will be prescribed 5 days of ciprofloxacin however she understands not to have her child to have any breastmilk for 7 days.  I will also refer her to gastroenterology as an outpatient in case her symptoms do not resolve.  Strict return precautions have been given.      ____________________________________________   FINAL CLINICAL IMPRESSION(S) / ED DIAGNOSES  Final diagnoses:  Diarrhea of presumed infectious origin  Dehydration      NEW MEDICATIONS STARTED DURING THIS VISIT:  Discharge Medication List as of 02/22/2018 11:48 PM    START taking these medications   Details  ciprofloxacin (CIPRO) 500 MG tablet Take 1 tablet (500 mg total) by mouth 2 (two) times daily for 5 days., Starting Sun 02/22/2018, Until Fri 02/27/2018, Print         Note:  This document was prepared using Dragon voice recognition software and may include unintentional dictation errors.     Darel Hong, MD 02/24/18 [id number]

## 2018-02-22 NOTE — ED Triage Notes (Signed)
Patient presents with complaint of diarrhea since Wednesday. Has gone several times every day. Denies fever, chills, vomiting. Patient's partner reports the patient is pale. Mucus membranes are moist. Patient states she took charcoal thinking it would help. It has not helped, in fact she has started "pooping again, like when it started on Wednesday."

## 2018-02-22 NOTE — Discharge Instructions (Addendum)
DO NOT BREASTFEED WHILE TAKING THIS ANTIBIOTIC.  IT IS ABSOLUTELY UNSAFE FOR YOUR BABY TO TAKE.  You can resume breastfeeding 3 days after your last dose of antibiotics.  Please take over the counter loperamide (imodium) after every loose stool up to a total of 8 a day to help keep you regular.  Follow up with gastroenterology if your symptoms do not resolve with antibiotics and return to the ED sooner for any concerns.  It was a pleasure to take care of you today, and thank you for coming to our emergency department.  If you have any questions or concerns before leaving please ask the nurse to grab me and I'm more than happy to go through your aftercare instructions again.  If you were prescribed any opioid pain medication today such as Norco, Vicodin, Percocet, morphine, hydrocodone, or oxycodone please make sure you do not drive when you are taking this medication as it can alter your ability to drive safely.  If you have any concerns once you are home that you are not improving or are in fact getting worse before you can make it to your follow-up appointment, please do not hesitate to call [phone] and come back for further evaluation.  Darel Hong, MD  Results for orders placed or performed during the hospital encounter of 02/22/18  Lipase, blood  Result Value Ref Range   Lipase 37 11 - 51 U/L  Comprehensive metabolic panel  Result Value Ref Range   Sodium 137 135 - 145 mmol/L   Potassium 3.2 (L) 3.5 - 5.1 mmol/L   Chloride 106 98 - 111 mmol/L   CO2 24 22 - 32 mmol/L   Glucose, Bld 96 70 - 99 mg/dL   BUN 9 6 - 20 mg/dL   Creatinine, Ser 0.74 0.44 - 1.00 mg/dL   Calcium 8.7 (L) 8.9 - 10.3 mg/dL   Total Protein 7.8 6.5 - 8.1 g/dL   Albumin 4.2 3.5 - 5.0 g/dL   AST 24 15 - 41 U/L   ALT 16 0 - 44 U/L   Alkaline Phosphatase 86 38 - 126 U/L   Total Bilirubin 0.4 0.3 - 1.2 mg/dL   GFR calc non Af Amer >60 >60 mL/min   GFR calc Af Amer >60 >60 mL/min   Anion gap 7 5 - 15  CBC  Result  Value Ref Range   WBC 4.5 4.0 - 10.5 K/uL   RBC 4.97 3.87 - 5.11 MIL/uL   Hemoglobin 14.4 12.0 - 15.0 g/dL   HCT 43.5 36.0 - 46.0 %   MCV 87.5 80.0 - 100.0 fL   MCH 29.0 26.0 - 34.0 pg   MCHC 33.1 30.0 - 36.0 g/dL   RDW 12.3 11.5 - 15.5 %   Platelets 286 150 - 400 K/uL   nRBC 0.0 0.0 - 0.2 %

## 2018-02-23 LAB — HCG, QUANTITATIVE, PREGNANCY: hCG, Beta Chain, Quant, S: <1 m[IU]/mL (ref <5)

## 2018-02-23 LAB — GASTROINTESTINAL PANEL BY PCR, STOOL (REPLACES STOOL CULTURE)
ADENOVIRUS F40/41: NOT DETECTED
ASTROVIRUS: NOT DETECTED
CYCLOSPORA CAYETANENSIS: NOT DETECTED
Campylobacter species: NOT DETECTED
Cryptosporidium: NOT DETECTED
ENTAMOEBA HISTOLYTICA: NOT DETECTED
ENTEROAGGREGATIVE E COLI (EAEC): NOT DETECTED
ENTEROTOXIGENIC E COLI (ETEC): NOT DETECTED
Enteropathogenic E coli (EPEC): NOT DETECTED
GIARDIA LAMBLIA: NOT DETECTED
NOROVIRUS GI/GII: NOT DETECTED
Plesimonas shigelloides: NOT DETECTED
Rotavirus A: NOT DETECTED
Salmonella species: NOT DETECTED
Sapovirus (I, II, IV, and V): DETECTED — AB
Shiga like toxin producing E coli (STEC): NOT DETECTED
Shigella/Enteroinvasive E coli (EIEC): NOT DETECTED
VIBRIO CHOLERAE: NOT DETECTED
VIBRIO SPECIES: NOT DETECTED
Yersinia enterocolitica: NOT DETECTED

## 2018-02-23 LAB — URINALYSIS, COMPLETE (UACMP) WITH MICROSCOPIC
BILIRUBIN URINE: NEGATIVE
GLUCOSE, UA: NEGATIVE mg/dL
KETONES UR: 5 mg/dL — AB
LEUKOCYTES UA: NEGATIVE
Nitrite: NEGATIVE
PH: 5 (ref 5.0–8.0)
PROTEIN: NEGATIVE mg/dL
Specific Gravity, Urine: 1.02 (ref 1.005–1.030)

## 2018-02-23 LAB — C DIFFICILE QUICK SCREEN W PCR REFLEX
C DIFFICLE (CDIFF) ANTIGEN: NEGATIVE
C Diff interpretation: NOT DETECTED
C Diff toxin: NEGATIVE

## 2018-03-11 HISTORY — PX: GASTROPLASTY DUODENAL SWITCH: SHX1699

## 2018-06-17 ENCOUNTER — Ambulatory Visit: Admit: 2018-06-17 | Payer: BC Managed Care – PPO | Admitting: Otolaryngology

## 2018-06-17 SURGERY — TONSILLECTOMY
Anesthesia: General | Laterality: Bilateral

## 2018-09-14 IMAGING — US US ABDOMEN LIMITED
1 series · 7 of 7 positions shown · non-contrast
Comparison: CT abdomen and pelvis 08/06/2013

CLINICAL DATA: Umbilical hernia on exam question contents

EXAM:
LIMITED ABDOMINAL ULTRASOUND
TECHNIQUE: Sonography was performed through the area of clinical/palpable
concern at the periumbilical region

[Series 1: us abdomen limited · 0.07mm/px · 7 of 7 slices shown]
[im 1/7]
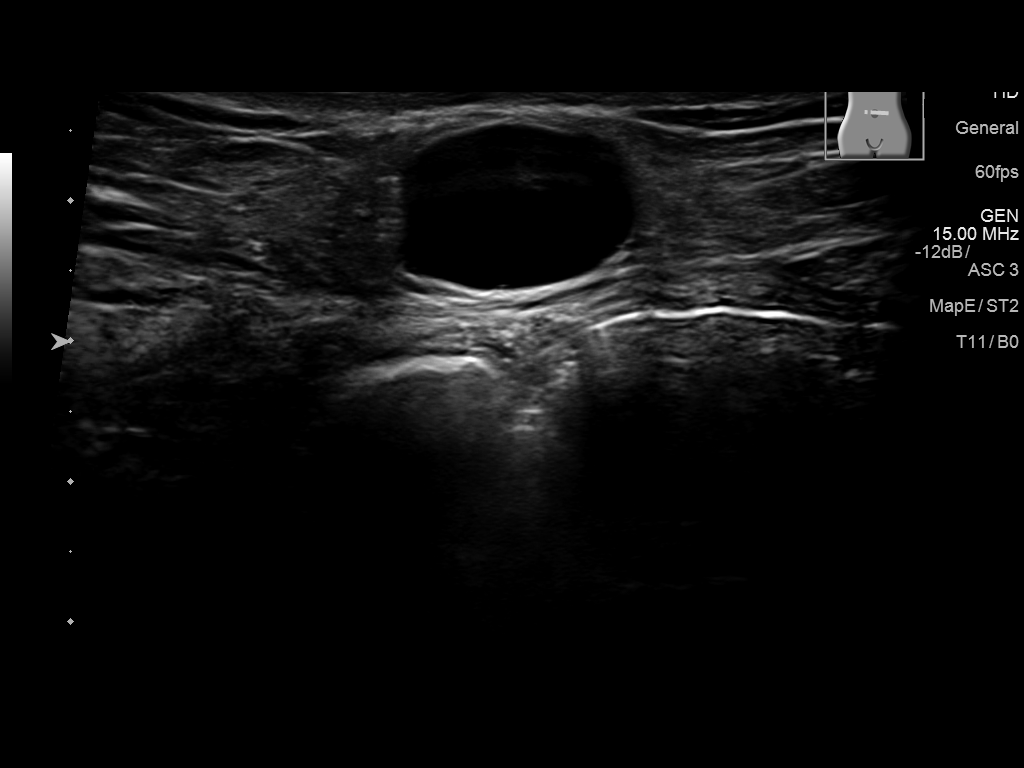
[im 2/7]
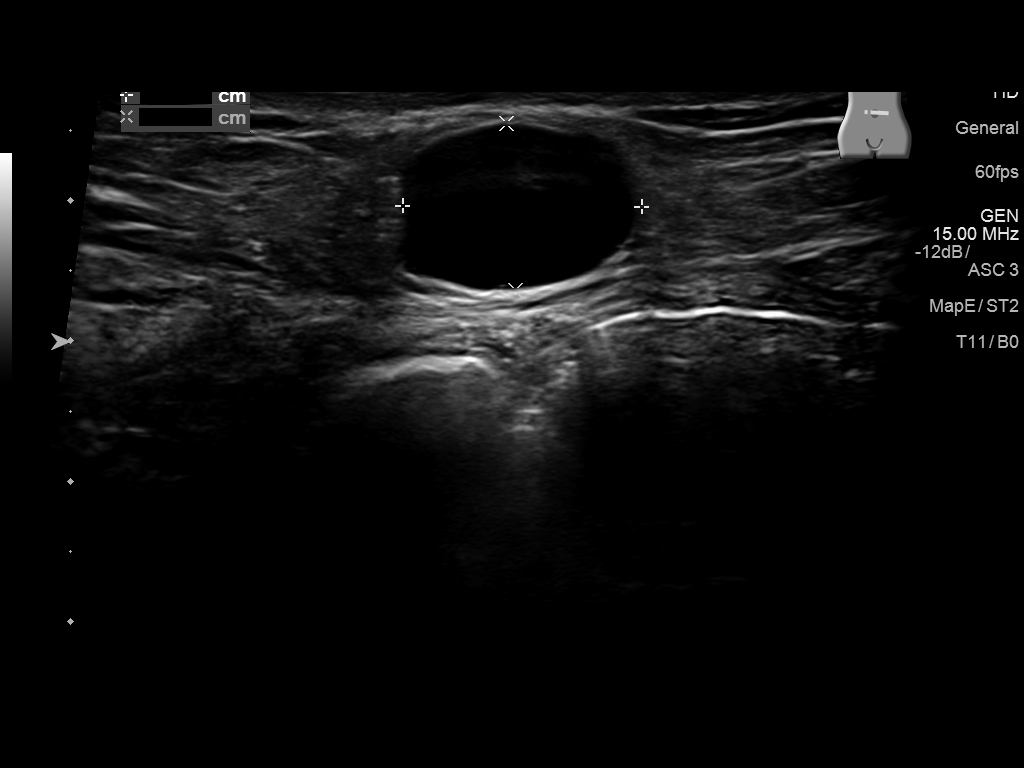
[im 3/7]
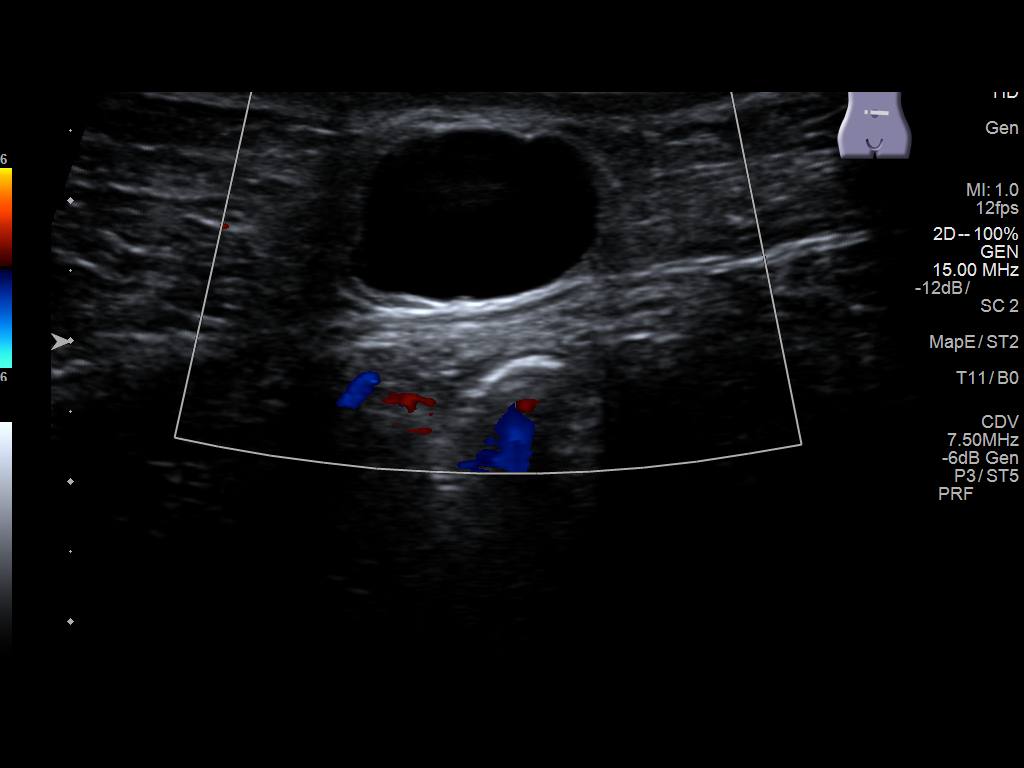
[im 4/7]
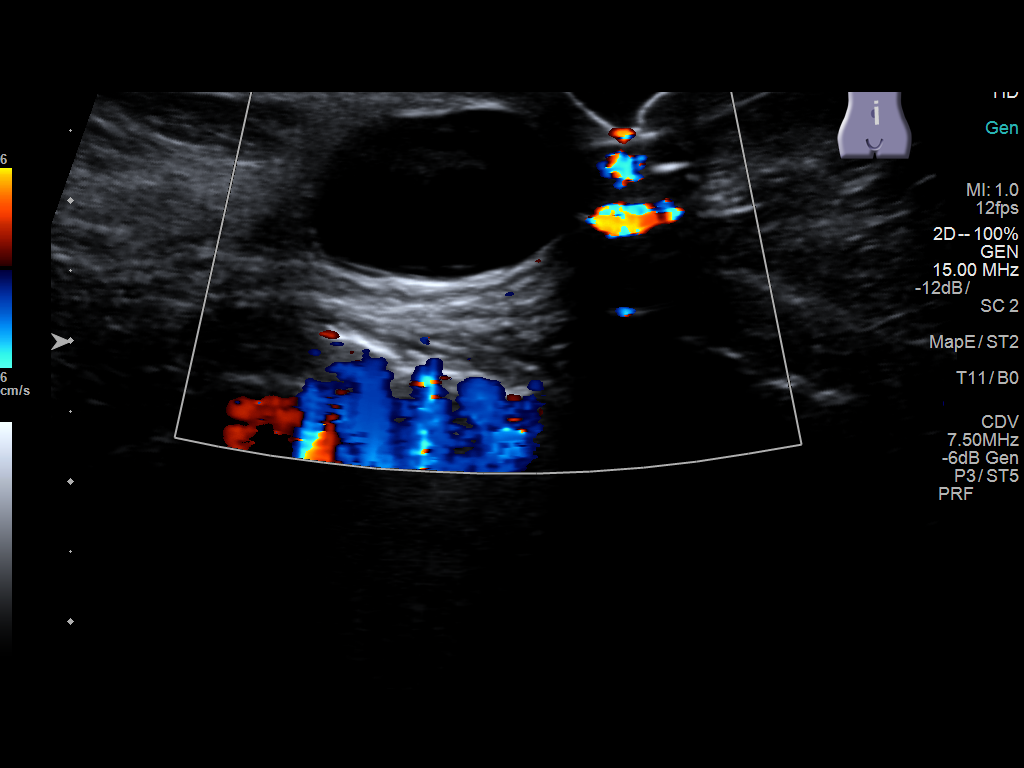
[im 5/7]
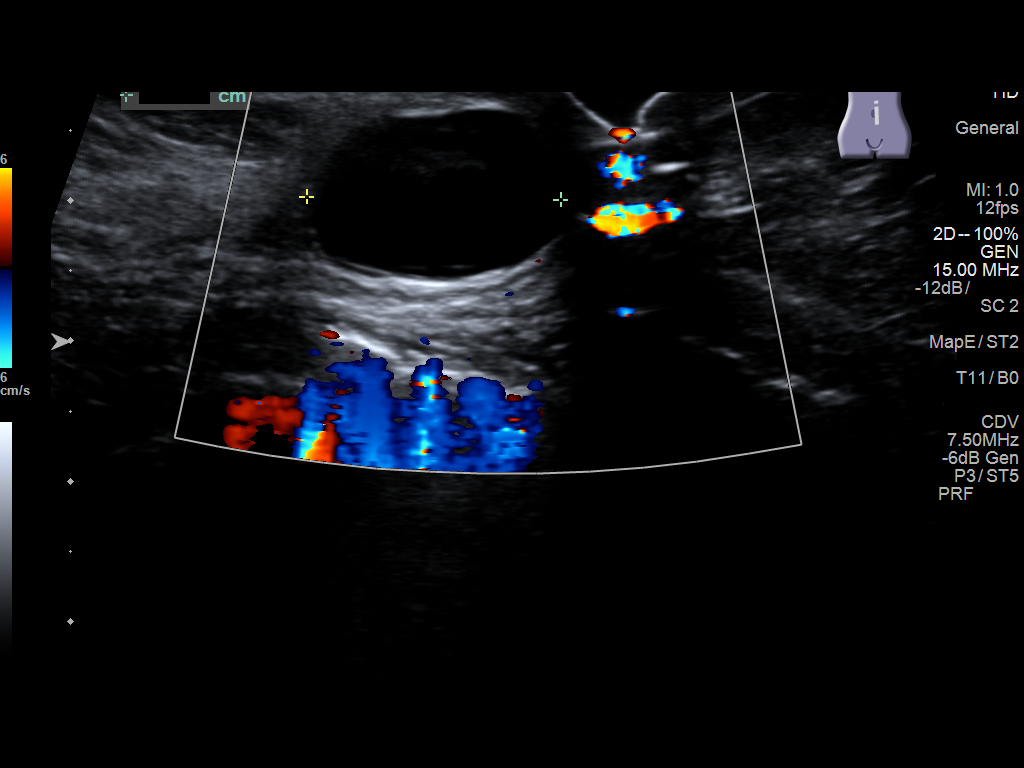
[im 6/7]
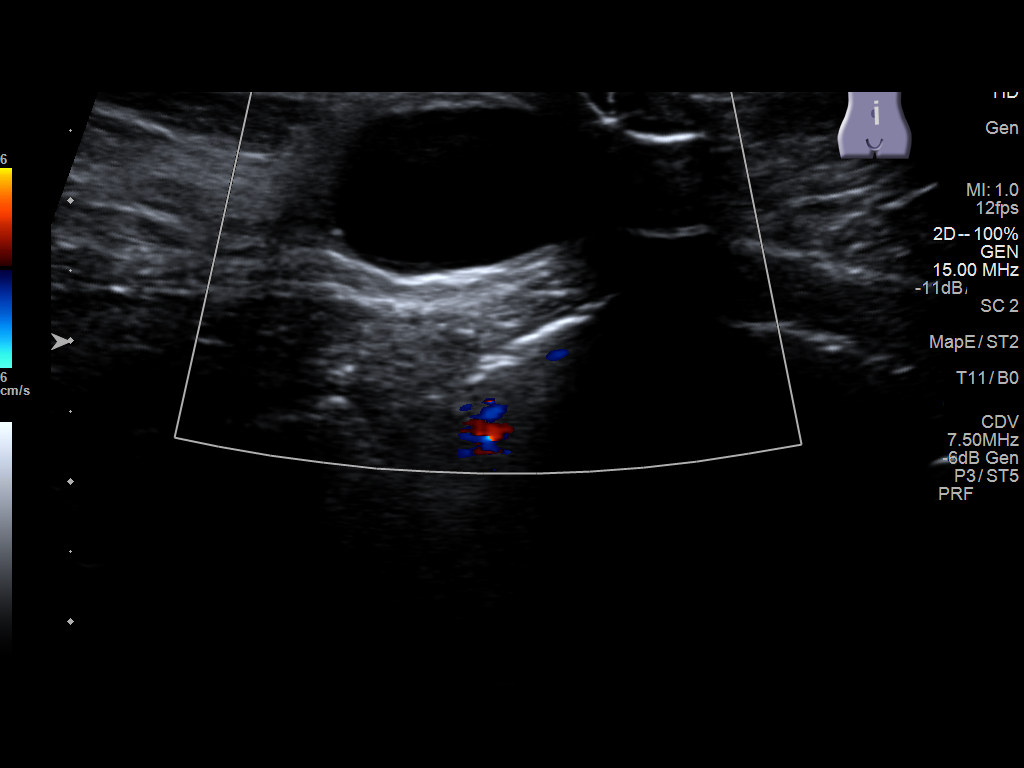
[im 7/7]
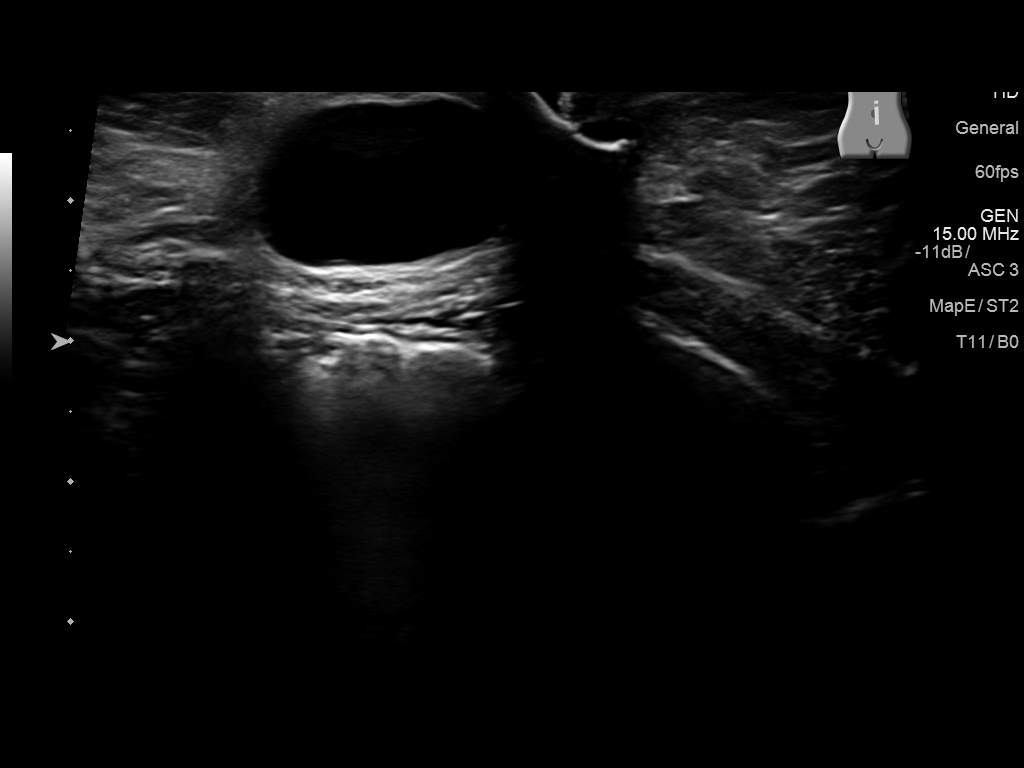

[7 of 7 positions shown; findings below may reference images not displayed]

FINDINGS: At the site of clinical concern, a well defined subcutaneous cystic
structures identified measuring 17 x 12 x 18 mm in size. The cystic
structure has a thin wall without mural nodularity, septations or
internal echogenicity. This is located immediately superior to the
umbilicus. Slightly increased blood flow is seen at the umbilicus
but not surrounding this cystic lesion.
IMPRESSION: Simple appearing subcutaneous cyst measuring 17 x 12 x 18 mm in size
located at the cranial margin of the umbilicus.

## 2018-10-07 ENCOUNTER — Other Ambulatory Visit: Payer: Self-pay

## 2018-10-07 ENCOUNTER — Ambulatory Visit
Admission: RE | Admit: 2018-10-07 | Discharge: 2018-10-07 | Disposition: A | Discharge status: Home / Self Care | Payer: BC Managed Care – PPO | Source: Ambulatory Visit | Attending: Obstetrics & Gynecology | Admitting: Obstetrics & Gynecology

## 2018-10-07 DIAGNOSIS — Z3169 Encounter for other general counseling and advice on procreation: Secondary | ICD-10-CM | POA: Insufficient documentation

## 2018-10-07 NOTE — Lactation Note (Signed)
Lactation Consultation Note  Patient Name: [name] Date: 10/07/2018     Maternal Data  Parent came for a preconception visit to ensure that tandem nursing is appropriate and safe for them, their current toddler, and future baby. Parent wants to conceive with Clomid and insemination around December 2020 due to COVID_19.   Parent has a hx of miscarriage in 2017 at 8wks, gastric bypass via sleeve, and fibromyalgia flare ups occasionally.   Feeding  N/A  LATCH Score  N/A                 Interventions  N/A  Lactation Tools Discussed/Used  N/A   Consult Status  LC spoke with parent about it being safe for them to tandem nurse during conception and pregnancy due to toddler almost being 39yo.   LC with nutrition background did research on nutrition during pregnancy due to gastric sleeve and found that tandem nursing would not deplete pregnant person from sustaining nutrition and they would have enough energy to spare.  Parent is ready for a healthy pregnancy while tandem nursing.     [name] 10/07/2018, 12:46 PM

## 2018-11-10 DIAGNOSIS — Z9884 Bariatric surgery status: Secondary | ICD-10-CM | POA: Insufficient documentation

## 2019-02-19 DIAGNOSIS — E860 Dehydration: Secondary | ICD-10-CM | POA: Insufficient documentation

## 2019-03-12 DIAGNOSIS — K56609 Unspecified intestinal obstruction, unspecified as to partial versus complete obstruction: Secondary | ICD-10-CM

## 2019-03-12 HISTORY — DX: Unspecified intestinal obstruction, unspecified as to partial versus complete obstruction: K56.609

## 2019-04-09 ENCOUNTER — Emergency Department (HOSPITAL_COMMUNITY)
Admission: EM | Admit: 2019-04-09 | Discharge: 2019-04-09 | Disposition: A | Discharge status: Other Acute Inpatient Hospital | Payer: BC Managed Care – PPO | Attending: Emergency Medicine | Admitting: Emergency Medicine

## 2019-04-09 ENCOUNTER — Emergency Department (HOSPITAL_COMMUNITY): Payer: BC Managed Care – PPO

## 2019-04-09 ENCOUNTER — Other Ambulatory Visit: Payer: Self-pay

## 2019-04-09 ENCOUNTER — Encounter (HOSPITAL_COMMUNITY): Payer: Self-pay

## 2019-04-09 DIAGNOSIS — Z79899 Other long term (current) drug therapy: Secondary | ICD-10-CM | POA: Diagnosis not present

## 2019-04-09 DIAGNOSIS — J45909 Unspecified asthma, uncomplicated: Secondary | ICD-10-CM | POA: Diagnosis not present

## 2019-04-09 DIAGNOSIS — Z87891 Personal history of nicotine dependence: Secondary | ICD-10-CM | POA: Diagnosis not present

## 2019-04-09 DIAGNOSIS — K56609 Unspecified intestinal obstruction, unspecified as to partial versus complete obstruction: Secondary | ICD-10-CM | POA: Diagnosis not present

## 2019-04-09 DIAGNOSIS — Z9889 Other specified postprocedural states: Secondary | ICD-10-CM | POA: Insufficient documentation

## 2019-04-09 DIAGNOSIS — R1033 Periumbilical pain: Secondary | ICD-10-CM | POA: Diagnosis present

## 2019-04-09 DIAGNOSIS — R109 Unspecified abdominal pain: Secondary | ICD-10-CM | POA: Insufficient documentation

## 2019-04-09 DIAGNOSIS — Z20822 Contact with and (suspected) exposure to covid-19: Secondary | ICD-10-CM | POA: Diagnosis not present

## 2019-04-09 LAB — COMPREHENSIVE METABOLIC PANEL
ALT: 15 U/L (ref 0–44)
AST: 22 U/L (ref 15–41)
Albumin: 4 g/dL (ref 3.5–5.0)
Alkaline Phosphatase: 51 U/L (ref 38–126)
Anion gap: 9 (ref 5–15)
BUN: 7 mg/dL (ref 6–20)
CO2: 25 mmol/L (ref 22–32)
Calcium: 8.6 mg/dL — ABNORMAL LOW (ref 8.9–10.3)
Chloride: 105 mmol/L (ref 98–111)
Creatinine, Ser: 0.86 mg/dL (ref 0.44–1.00)
GFR calc Af Amer: >60 mL/min (ref >60)
GFR calc non Af Amer: >60 mL/min (ref >60)
Glucose, Bld: 108 mg/dL — ABNORMAL HIGH (ref 70–99)
Potassium: 3.5 mmol/L (ref 3.5–5.1)
Sodium: 139 mmol/L (ref 135–145)
Total Bilirubin: 1.1 mg/dL (ref 0.3–1.2)
Total Protein: 7.1 g/dL (ref 6.5–8.1)

## 2019-04-09 LAB — CBC WITH DIFFERENTIAL/PLATELET
Abs Immature Granulocytes: 0.02 10*3/uL (ref 0.00–0.07)
Basophils Absolute: 0.1 10*3/uL (ref 0.0–0.1)
Basophils Relative: 1 %
Eosinophils Absolute: 0.1 10*3/uL (ref 0.0–0.5)
Eosinophils Relative: 1 %
HCT: 41.7 % (ref 36.0–46.0)
Hemoglobin: 13.4 g/dL (ref 12.0–15.0)
Immature Granulocytes: 0 %
Lymphocytes Relative: 53 %
Lymphs Abs: 4 10*3/uL (ref 0.7–4.0)
MCH: 29.8 pg (ref 26.0–34.0)
MCHC: 32.1 g/dL (ref 30.0–36.0)
MCV: 92.7 fL (ref 80.0–100.0)
Monocytes Absolute: 0.6 10*3/uL (ref 0.1–1.0)
Monocytes Relative: 9 %
Neutro Abs: 2.7 10*3/uL (ref 1.7–7.7)
Neutrophils Relative %: 36 %
Platelets: 303 10*3/uL (ref 150–400)
RBC: 4.5 MIL/uL (ref 3.87–5.11)
RDW: 13.2 % (ref 11.5–15.5)
WBC: 7.5 10*3/uL (ref 4.0–10.5)
nRBC: 0 % (ref 0.0–0.2)

## 2019-04-09 LAB — HCG, QUANTITATIVE, PREGNANCY: hCG, Beta Chain, Quant, S: <1 m[IU]/mL (ref <5)

## 2019-04-09 LAB — URINALYSIS, ROUTINE W REFLEX MICROSCOPIC
Bilirubin Urine: NEGATIVE
Glucose, UA: NEGATIVE mg/dL
Hgb urine dipstick: NEGATIVE
Ketones, ur: 20 mg/dL — AB
Nitrite: NEGATIVE
Protein, ur: NEGATIVE mg/dL
Specific Gravity, Urine: 1.016 (ref 1.005–1.030)
pH: 7 (ref 5.0–8.0)

## 2019-04-09 LAB — PREGNANCY, URINE: Preg Test, Ur: NEGATIVE

## 2019-04-09 LAB — RESPIRATORY PANEL BY RT PCR (FLU A&B, COVID)
Influenza A by PCR: NEGATIVE
Influenza B by PCR: NEGATIVE
SARS Coronavirus 2 by RT PCR: NEGATIVE

## 2019-04-09 LAB — LIPASE, BLOOD: Lipase: 34 U/L (ref 11–51)

## 2019-04-09 MED ORDER — MORPHINE SULFATE (PF) 4 MG/ML IV SOLN
4.0000 mg | Freq: Once | INTRAVENOUS | Status: AC
  Administered 2019-04-09: 4 mg via INTRAVENOUS
  Filled 2019-04-09: qty 1

## 2019-04-09 MED ORDER — DIPHENHYDRAMINE HCL 50 MG/ML IJ SOLN
25.0000 mg | Freq: Once | INTRAMUSCULAR | Status: AC
  Administered 2019-04-09: 25 mg via INTRAVENOUS
  Filled 2019-04-09: qty 1

## 2019-04-09 MED ORDER — IOHEXOL 300 MG/ML  SOLN
100.0000 mL | Freq: Once | INTRAMUSCULAR | Status: AC | PRN
  Administered 2019-04-09: 100 mL via INTRAVENOUS

## 2019-04-09 MED ORDER — IOHEXOL 9 MG/ML PO SOLN
ORAL | Status: AC
  Administered 2019-04-09: 1000 mL via ORAL
  Filled 2019-04-09: qty 1000

## 2019-04-09 MED ORDER — ONDANSETRON HCL 4 MG/2ML IJ SOLN
4.0000 mg | Freq: Once | INTRAMUSCULAR | Status: AC
  Administered 2019-04-09: 4 mg via INTRAVENOUS

## 2019-04-09 MED ORDER — SODIUM CHLORIDE 0.9 % IV SOLN: INTRAVENOUS | Status: DC

## 2019-04-09 MED ORDER — FENTANYL CITRATE (PF) 100 MCG/2ML IJ SOLN
INTRAMUSCULAR | Status: AC
  Filled 2019-04-09: qty 2

## 2019-04-09 MED ORDER — PROMETHAZINE HCL 25 MG/ML IJ SOLN
12.5000 mg | Freq: Once | INTRAMUSCULAR | Status: AC
  Administered 2019-04-09: 12.5 mg via INTRAVENOUS
  Filled 2019-04-09: qty 1

## 2019-04-09 MED ORDER — ONDANSETRON HCL 4 MG/2ML IJ SOLN
4.0000 mg | Freq: Once | INTRAMUSCULAR | Status: AC
  Administered 2019-04-09: 4 mg via INTRAVENOUS
  Filled 2019-04-09: qty 2

## 2019-04-09 MED ORDER — PROMETHAZINE HCL 25 MG/ML IJ SOLN
25.0000 mg | Freq: Once | INTRAMUSCULAR | Status: AC
  Administered 2019-04-09: 25 mg via INTRAVENOUS
  Filled 2019-04-09: qty 1

## 2019-04-09 MED ORDER — IOHEXOL 9 MG/ML PO SOLN: 1000.0000 mL | ORAL | Status: AC

## 2019-04-09 MED ORDER — ONDANSETRON HCL 4 MG/2ML IJ SOLN
INTRAMUSCULAR | Status: AC
  Filled 2019-04-09: qty 2

## 2019-04-09 MED ORDER — SODIUM CHLORIDE (PF) 0.9 % IJ SOLN
INTRAMUSCULAR | Status: AC
  Filled 2019-04-09: qty 50

## 2019-04-09 MED ORDER — FENTANYL CITRATE (PF) 100 MCG/2ML IJ SOLN
50.0000 ug | INTRAMUSCULAR | Status: DC | PRN
  Administered 2019-04-09 (×4): 50 ug via INTRAVENOUS
  Filled 2019-04-09 (×5): qty 2

## 2019-04-09 MED ORDER — SODIUM CHLORIDE 0.9 % IV BOLUS
1000.0000 mL | Freq: Once | INTRAVENOUS | Status: AC
  Administered 2019-04-09: 1000 mL via INTRAVENOUS

## 2019-04-09 MED ORDER — FENTANYL CITRATE (PF) 100 MCG/2ML IJ SOLN
100.0000 ug | Freq: Once | INTRAMUSCULAR | Status: AC
  Administered 2019-04-09: 100 ug via INTRAVENOUS

## 2019-04-09 NOTE — ED Notes (Signed)
Recalled [facility] transfer [facility] to repage surgery because Dr. Jeanell Sparrow hasn't received a call back from them.

## 2019-04-09 NOTE — ED Provider Notes (Signed)
[facility] DEPT Provider Note: Georgena Spurling, MD, FACEP  CSN: [id number] MRN: [id number] ARRIVAL: 04/09/19 at [place]  Abdominal Pain and Nausea   HISTORY OF PRESENT ILLNESS  04/09/19 2:15 AM [name] is a 39 y.o. 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 Carmelina Noun.  She is here with sharp, severe, periumbilical abdominal pain that came on suddenly about an hour prior to arrival.  She has had associated nausea but no vomiting, diarrhea or fever.  Pain is worse with palpation or movement.  Her abdomen is not distended.  She states she has had decreased oral intake recently and believes she is dehydrated.   Past Medical History:  Diagnosis Date  . Asthma   . Breast cyst   . Breast mass   . Depression   . Fibrocystic breast   . Fibromyalgia   . Migraines     Past Surgical History:  Procedure Laterality Date  . BUNIONECTOMY Bilateral   . EYE SURGERY     blood clot in eye  . HERNIA REPAIR  1991, 2001   inguinal and umbilical  . LAPAROSCOPIC GASTRIC SLEEVE RESECTION  2015    Family History  Problem Relation Age of Onset  . Breast cancer Paternal Aunt   . Breast cancer Paternal Aunt   . Healthy Mother   . CVA Father   . Hypertension Father   . Diabetes Father   . Breast cancer Paternal Grandmother     Social History   Tobacco Use  . Smoking status: Former Smoker    Quit date: 06/21/2003    Years since quitting: 15.8  . Smokeless tobacco: Never Used  Substance Use Topics  . Alcohol use: No  . Drug use: No    Prior to Admission medications   Medication Sig Start Date End Date Taking? Authorizing Provider  B Complex-C (SUPER B COMPLEX/VITAMIN C PO) Take 1 tablet by mouth daily.    Yes [provider]  calcium carbonate (OS-CAL - DOSED IN MG OF ELEMENTAL CALCIUM) 1250 (500 Ca) MG tablet Take 1 tablet by mouth 2  (two) times daily with a meal.   Yes [provider]  cholecalciferol (VITAMIN D) 1000 units tablet Take 1,000 Units by mouth daily.   Yes [provider]  Multiple Vitamin (MULTIVITAMIN WITH MINERALS) TABS tablet Take 1 tablet by mouth daily.   Yes [provider]    Allergies Penicillins, Chlorhexidine, Chlorhexidine gluconate, Clonazepam, Dexamethasone, Doxycycline, Fluoxetine, Nsaids, Other, Hydrocodone-acetaminophen, Tape, and Tuberculin ppd   REVIEW OF SYSTEMS  Negative except as noted here or in the History of Present Illness.   PHYSICAL EXAMINATION  Initial Vital Signs Blood pressure 120/81, pulse 80, temperature 97.8 F (36.6 C), temperature source Oral, resp. rate 18, height 5\' 4"  (1.626 m), weight 99 kg, SpO2 98 %, currently breastfeeding.  Examination General: Well-developed, well-nourished male in obvious discomfort; appearance consistent with age of record HENT: normocephalic; atraumatic Eyes: Cosmetic contact lenses; extraocular muscles intact Neck: supple Heart: regular rate and rhythm Lungs: clear to auscultation bilaterally Abdomen: soft; nondistended; epigastric tenderness; bowel sounds present Extremities: No deformity; full range of motion; pulses normal Neurologic: Awake, alert and oriented; motor function intact in all extremities and symmetric; no facial droop Skin: Warm and dry Psychiatric: Moaning; writhing   RESULTS  Summary of this visit's results, reviewed and interpreted by myself:   EKG Interpretation  Date/Time:    Ventricular Rate:    PR Interval:    QRS Duration:   QT Interval:    QTC Calculation:   R Axis:     Text Interpretation:        Laboratory Studies: Results for orders placed or performed during the hospital encounter of 04/09/19 (from the past 24 hour(s))  CBC with Differential/Platelet     Status: None   Collection Time: 04/09/19  2:21 AM  Result Value Ref Range   WBC 7.5 4.0 - 10.5 K/uL    RBC 4.50 3.87 - 5.11 MIL/uL   Hemoglobin 13.4 12.0 - 15.0 g/dL   HCT 41.7 36.0 - 46.0 %   MCV 92.7 80.0 - 100.0 fL   MCH 29.8 26.0 - 34.0 pg   MCHC 32.1 30.0 - 36.0 g/dL   RDW 13.2 11.5 - 15.5 %   Platelets 303 150 - 400 K/uL   nRBC 0.0 0.0 - 0.2 %   Neutrophils Relative % 36 %   Neutro Abs 2.7 1.7 - 7.7 K/uL   Lymphocytes Relative 53 %   Lymphs Abs 4.0 0.7 - 4.0 K/uL   Monocytes Relative 9 %   Monocytes Absolute 0.6 0.1 - 1.0 K/uL   Eosinophils Relative 1 %   Eosinophils Absolute 0.1 0.0 - 0.5 K/uL   Basophils Relative 1 %   Basophils Absolute 0.1 0.0 - 0.1 K/uL   Immature Granulocytes 0 %   Abs Immature Granulocytes 0.02 0.00 - 0.07 K/uL  Comprehensive metabolic panel     Status: Abnormal   Collection Time: 04/09/19  2:21 AM  Result Value Ref Range   Sodium 139 135 - 145 mmol/L   Potassium 3.5 3.5 - 5.1 mmol/L   Chloride 105 98 - 111 mmol/L   CO2 25 22 - 32 mmol/L   Glucose, Bld 108 (H) 70 - 99 mg/dL   BUN 7 6 - 20 mg/dL   Creatinine, Ser 0.86 0.44 - 1.00 mg/dL   Calcium 8.6 (L) 8.9 - 10.3 mg/dL   Total Protein 7.1 6.5 - 8.1 g/dL   Albumin 4.0 3.5 - 5.0 g/dL   AST 22 15 - 41 U/L   ALT 15 0 - 44 U/L   Alkaline Phosphatase 51 38 - 126 U/L   Total Bilirubin 1.1 0.3 - 1.2 mg/dL   GFR calc non Af Amer >60 >60 mL/min   GFR calc Af Amer >60 >60 mL/min   Anion gap 9 5 - 15  Lipase, blood     Status: None   Collection Time: 04/09/19  2:21 AM  Result Value Ref Range   Lipase 34 11 - 51 U/L  hCG, quantitative, pregnancy     Status: None   Collection Time: 04/09/19  2:27 AM  Result Value Ref Range   hCG, Beta Chain, Quant, S <1 <5 mIU/mL  Urinalysis, Routine w reflex microscopic     Status: Abnormal   Collection Time: 04/09/19  3:00 AM  Result Value Ref Range   Color, Urine YELLOW YELLOW   APPearance HAZY (A) CLEAR   Specific Gravity, Urine 1.016 1.005 - 1.030   pH 7.0 5.0 - 8.0   Glucose, UA NEGATIVE NEGATIVE mg/dL   Hgb urine dipstick NEGATIVE NEGATIVE   Bilirubin  Urine NEGATIVE NEGATIVE   Ketones, ur 20 (A) NEGATIVE mg/dL   Protein, ur NEGATIVE NEGATIVE mg/dL   Nitrite NEGATIVE NEGATIVE   Leukocytes,Ua SMALL (A) NEGATIVE   RBC / HPF 0-5 0 - 5 RBC/hpf  WBC, UA 0-5 0 - 5 WBC/hpf   Bacteria, UA RARE (A) NONE SEEN   Squamous Epithelial / LPF 6-10 0 - 5   Mucus PRESENT   Pregnancy, urine     Status: None   Collection Time: 04/09/19  3:00 AM  Result Value Ref Range   Preg Test, Ur NEGATIVE NEGATIVE  Respiratory Panel by RT PCR (Flu A&B, Covid) - Nasopharyngeal Swab     Status: None   Collection Time: 04/09/19  4:25 AM   Specimen: Nasopharyngeal Swab  Result Value Ref Range   SARS Coronavirus 2 by RT PCR NEGATIVE NEGATIVE   Influenza A by PCR NEGATIVE NEGATIVE   Influenza B by PCR NEGATIVE NEGATIVE   Imaging Studies: CT ABDOMEN PELVIS W CONTRAST  Result Date: 04/09/2019 CLINICAL DATA:  Periumbilical pain for 2 months. History of duodenal sleeve EXAM: CT ABDOMEN AND PELVIS WITH CONTRAST TECHNIQUE: Multidetector CT imaging of the abdomen and pelvis was performed using the standard protocol following bolus administration of intravenous contrast. CONTRAST:  134mL OMNIPAQUE IOHEXOL 300 MG/ML  SOLN COMPARISON:  08/06/2013 FINDINGS: Lower chest:  No contributory findings. Hepatobiliary: No focal liver abnormality.No evidence of biliary obstruction or stone. Pancreas: Unremarkable. Spleen: Unremarkable. Adrenals/Urinary Tract: Negative adrenals. No hydronephrosis or stone. Unremarkable bladder. Stomach/Bowel: Gastric reduction surgery with cluster of distended small bowel loops and indistinct mesenteric fat just inferior to the transverse mesocolon and along the left gutter. The only affected bowel loops are within the pancreatobiliary limb. No evidence of perforation. No inflammation at the gastroenteric anastomosis. Vascular/Lymphatic: No acute vascular abnormality. No mass or adenopathy. Reproductive:No pathologic findings. Other: No ascites or  pneumoperitoneum. Musculoskeletal: No acute abnormalities. IMPRESSION: Small bowel obstruction affecting the pancreatobiliary limb with regional distortion of mesentery from adhesion or internal hernia. Electronically Signed   By: Monte Fantasia M.D.   On: 04/09/2019 04:04   DG Abd Portable 1 View  Result Date: 04/09/2019 CLINICAL DATA:  Confirm nasogastric tube placement EXAM: PORTABLE ABDOMEN - 1 VIEW COMPARISON:  Abdominal CT from earlier today FINDINGS: Nasogastric tube with tip over the stomach. The tube is somewhat distorted given postoperative stomach. Dilated small bowel on prior CT is fluid-filled and largely in apparent on this study. Lung bases are clear. IMPRESSION: Enteric tube tip and side-port over the postoperative stomach. Electronically Signed   By: Monte Fantasia M.D.   On: 04/09/2019 06:35    ED COURSE and MDM  Nursing notes, initial and subsequent vitals signs, including pulse oximetry, reviewed and interpreted by myself.  Vitals:   04/09/19 0402 04/09/19 0445 04/09/19 0600 04/09/19 0615  BP: 132/83  129/89   Pulse: 100 85 100 (!) 101  Resp: 18  16   Temp:      TempSrc:      SpO2: 95% 100% 100% 98%  Weight:      Height:       Medications  fentaNYL (SUBLIMAZE) injection 50 mcg (50 mcg Intravenous Given 04/09/19 0610)  fentaNYL (SUBLIMAZE) injection 100 mcg (100 mcg Intravenous Given 04/09/19 0222)  ondansetron (ZOFRAN) injection 4 mg (4 mg Intravenous Given 04/09/19 0222)  sodium chloride 0.9 % bolus 1,000 mL (0 mLs Intravenous Stopped 04/09/19 0344)  iohexol (OMNIPAQUE) 9 MG/ML oral solution 1,000 mL (1,000 mLs Oral Contrast Given 04/09/19 0244)  sodium chloride (PF) 0.9 % injection (  Given by Other 04/09/19 0344)  promethazine (PHENERGAN) injection 12.5 mg (12.5 mg Intravenous Given 04/09/19 0259)  morphine 4 MG/ML injection 4 mg (4 mg Intravenous Given 04/09/19 0344)  diphenhydrAMINE (BENADRYL) injection 25 mg (25 mg Intravenous Given 04/09/19 0344)  iohexol  (OMNIPAQUE) 300 MG/ML solution 100 mL (100 mLs Intravenous Contrast Given 04/09/19 0345)  promethazine (PHENERGAN) injection 12.5 mg (12.5 mg Intravenous Given 04/09/19 0610)   5:10 AM CT scan shows small bowel obstruction.  Have been attempting to contact the patient's bariatric surgeon but I am still waiting for reply.  NG tube held pending discussion with her surgeon.  5:46 AM Discussed with Melissa Noon, PA-C.  She will attempt to arrange a direct admission to the bariatric surgery service at [facility] in [place], [place].  If unable to secure a room for direct admission, will see the patient in the ED there.  Accepting physician is Dr. Kreg Shropshire.  [name] states it is safe to place a nasogastric tube.  6:32 AM NG tube placed by nursing staff.  Radiographic verification of placement pending.   PROCEDURES  Procedures  CRITICAL CARE Performed by: Karen Chafe Lynanne Delgreco Total critical care time: 30 minutes Critical care time was exclusive of separately billable procedures and treating other patients. Critical care was necessary to treat or prevent imminent or life-threatening deterioration. Critical care was time spent personally by me on the following activities: development of treatment plan with patient and/or surrogate as well as nursing, discussions with consultants, evaluation of patient's response to treatment, examination of patient, obtaining history from patient or surrogate, ordering and performing treatments and interventions, ordering and review of laboratory studies, ordering and review of radiographic studies, pulse oximetry and re-evaluation of patient's condition.   ED DIAGNOSES     ICD-10-CM   1. SBO (small bowel obstruction) ([place])  K56.609        Mordechai Matuszak, Jenny Reichmann, MD 04/09/19 [id number]

## 2019-04-09 NOTE — ED Notes (Signed)
Called [facility] transfer center and they are contacting the surgery on call to call Dr. Jeanell Sparrow.

## 2019-04-09 NOTE — ED Provider Notes (Signed)
39 year old male geriatric patient presents today with small bowel obstruction.  She was signed out to me at awaiting transport to [facility] where her.  Bariatric surgeon is.  It is reported to me now that when we called they were unaware of patient awaiting transport.  I was informed that the surgeon who is on call last night is no longer on-call.  I reviewed Dr. Burnett Sheng note and he spoke with PA was supposed to also speak with surgeon.  They are currently paging the on-call surgeon to me Discussed with Dr. Terance Hart at [facility].  Judeen Hammans, nurse in the transfer center called me and states that they are taking the patient ED to ED.  She stated that Ok Edwards, the PA, and Dr. Darnell Level will see the patient there in the ED. Patient updated   Pattricia Boss, MD 04/09/19 1209

## 2019-04-09 NOTE — ED Notes (Signed)
Report was given to Megan Salon RN.

## 2019-04-09 NOTE — ED Triage Notes (Signed)
Pt presents to ED from home via [facility] from home c/o sudden onset of umbilical area abdominal pain and nausea. Pt reports she is about 2 months post op from a duodenum surgery. Pt is in obvious discomfort and tearful upon presentation to ED. Ambulatory with EMS.

## 2019-04-09 NOTE — ED Notes (Signed)
Paper work given to [employer] who are here to take patient.

## 2019-04-15 ENCOUNTER — Other Ambulatory Visit (INDEPENDENT_AMBULATORY_CARE_PROVIDER_SITE_OTHER): Payer: Self-pay | Admitting: Nurse Practitioner

## 2019-04-15 DIAGNOSIS — I83819 Varicose veins of unspecified lower extremities with pain: Secondary | ICD-10-CM

## 2019-04-16 ENCOUNTER — Ambulatory Visit (INDEPENDENT_AMBULATORY_CARE_PROVIDER_SITE_OTHER): Payer: BC Managed Care – PPO | Admitting: Nurse Practitioner

## 2019-04-16 ENCOUNTER — Other Ambulatory Visit: Payer: Self-pay

## 2019-04-16 ENCOUNTER — Ambulatory Visit (INDEPENDENT_AMBULATORY_CARE_PROVIDER_SITE_OTHER): Payer: BC Managed Care – PPO

## 2019-04-16 ENCOUNTER — Encounter (INDEPENDENT_AMBULATORY_CARE_PROVIDER_SITE_OTHER): Payer: Self-pay | Admitting: Nurse Practitioner

## 2019-04-16 VITALS — BP 111/72 | HR 90 | Resp 16 | Ht 70.0 in | Wt 204.0 lb

## 2019-04-16 DIAGNOSIS — Z903 Acquired absence of stomach [part of]: Secondary | ICD-10-CM | POA: Insufficient documentation

## 2019-04-16 DIAGNOSIS — I83819 Varicose veins of unspecified lower extremities with pain: Secondary | ICD-10-CM

## 2019-04-20 ENCOUNTER — Encounter (INDEPENDENT_AMBULATORY_CARE_PROVIDER_SITE_OTHER): Payer: Self-pay | Admitting: Nurse Practitioner

## 2019-04-20 NOTE — Progress Notes (Signed)
SUBJECTIVE:  Patient ID: [name], male    DOB: 04-May-1980, 39 y.o.   MRN: [id number] Chief Complaint  Patient presents with  . New Patient (Initial Visit)    ref [facility] for varicose veins     HPI  [name] is a 39 y.o. male the presents today for evaluation for varicose veins.  Recently the patient gave birth however beforehand the patient reports feeling extreme pain and pressure while pregnant.  She states that the varicosities were worse in the back of her knees and she felt more pressure when she was sitting.  She states that based on the intense pressure the patient had to wear compression stockings daily throughout her pregnancy.  The patient does endorse that since having her child the varicose vein pain has not been as extreme.  She still continues to have some pain with sitting due to the varicosities being mainly behind her knee.  The patient also continues to wear medical grade compression stockings in addition to elevation of her size.  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  She denies any fever, chills, nausea vomiting or diarrhea.  Today the patient underwent bilateral venous reflux.  No evidence of DVT seen bilaterally.  No evidence of superficial venous thrombosis seen bilaterally.  No evidence of venous insufficiency in the deep venous or superficial venous system bilaterally  Past Medical History:  Diagnosis Date  . Asthma   . Breast cyst   . Breast mass   . Depression   . Fibrocystic breast   . Fibromyalgia   . Migraines     Past Surgical History:  Procedure Laterality Date  . BUNIONECTOMY Bilateral   . EYE SURGERY     blood clot in eye  . HERNIA REPAIR  1991, 2001   inguinal and umbilical  . LAPAROSCOPIC GASTRIC SLEEVE RESECTION  2015    Social History   Socioeconomic History  . Marital status: Married    Spouse name: Not on file  . Number of children: Not on file  . Years of education: Not on file  .  Highest education level: Not on file  Occupational History  . Not on file  Tobacco Use  . Smoking status: Former Smoker    Quit date: 06/21/2003    Years since quitting: 15.8  . Smokeless tobacco: Never Used  Substance and Sexual Activity  . Alcohol use: No  . Drug use: No  . Sexual activity: Yes  Other Topics Concern  . Not on file  Social History Narrative  . Not on file   Social Determinants of Health   Financial Resource Strain:   . Difficulty of Paying Living Expenses: Not on file  Food Insecurity:   . Worried About Charity fundraiser in the Last Year: Not on file  . Ran Out of Food in the Last Year: Not on file  Transportation Needs:   . Lack of Transportation (Medical): Not on file  . Lack of Transportation (Non-Medical): Not on file  Physical Activity:   . Days of Exercise per Week: Not on file  . Minutes of Exercise per Session: Not on file  Stress:   . Feeling of Stress : Not on file  Social Connections:   . Frequency of Communication with Friends and Family: Not on file  . Frequency of Social Gatherings with Friends and Family: Not on file  . Attends Religious Services: Not on file  . Active Member  of Clubs or Organizations: Not on file  . Attends Archivist Meetings: Not on file  . Marital Status: Not on file  Intimate Partner Violence:   . Fear of Current or Ex-Partner: Not on file  . Emotionally Abused: Not on file  . Physically Abused: Not on file  . Sexually Abused: Not on file    Family History  Problem Relation Age of Onset  . Breast cancer Paternal Aunt   . Breast cancer Paternal Aunt   . Healthy Mother   . CVA Father   . Hypertension Father   . Diabetes Father   . Breast cancer Paternal Grandmother     Allergies  Allergen Reactions  . Penicillins Anaphylaxis    Did it involve swelling of the face/tongue/throat, SOB, or low BP? Yes Did it involve sudden or severe rash/hives, skin peeling, or any reaction on the inside of your  mouth or nose? Unknown Did you need to seek medical attention at a hospital or doctor's office? Yes When did it last happen? If all above answers are "NO", may proceed with cephalosporin use.   . Chlorhexidine Hives  . Chlorhexidine Gluconate Hives  . Clonazepam     Indifference to life attitude  . Dexamethasone Hives  . Doxycycline Nausea And Vomiting    Severe vomiting  . Fluoxetine Other (See Comments)    Suicidal thoughts  . Nsaids     No nsaids due to gastric surgery  . Other     CHLORAHEXADINE  . Hydrocodone-Acetaminophen Hives and Rash  . Tape Rash  . Tuberculin Ppd Rash     Review of Systems   Review of Systems: Negative Unless Checked Constitutional: [] Weight loss  [] Fever  [] Chills Cardiac: [] Chest pain   []  Atrial Fibrillation  [] Palpitations   [] Shortness of breath when laying flat   [] Shortness of breath with exertion. [] Shortness of breath at rest Vascular:  [] Pain in legs with walking   [] Pain in legs with standing [] Pain in legs when laying flat   [] Claudication    [] Pain in feet when laying flat    [] History of DVT   [] Phlebitis   [x] Swelling in legs   [x] Varicose veins   [] Non-healing ulcers Pulmonary:   [] Uses home oxygen   [] Productive cough   [] Hemoptysis   [] Wheeze  [] COPD   [] Asthma Neurologic:  [] Dizziness   [] Seizures  [] Blackouts [] History of stroke   [] History of TIA  [] Aphasia   [] Temporary Blindness   [] Weakness or numbness in arm   [] Weakness or numbness in leg Musculoskeletal:   [] Joint swelling   [] Joint pain   [] Low back pain  []  History of Knee Replacement [] Arthritis [] back Surgeries  []  Spinal Stenosis    Hematologic:  [] Easy bruising  [] Easy bleeding   [] Hypercoagulable state   [] Anemic Gastrointestinal:  [] Diarrhea   [] Vomiting  [] Gastroesophageal reflux/heartburn   [] Difficulty swallowing. [] Abdominal pain Genitourinary:  [] Chronic kidney disease   [] Difficult urination  [] Anuric   [] Blood in urine [] Frequent urination  [] Burning with  urination   [] Hematuria Skin:  [] Rashes   [] Ulcers [] Wounds Psychological:  [x] History of anxiety   []  History of major depression  []  Memory Difficulties      OBJECTIVE:   Physical Exam  BP 111/72 (BP Location: Left Arm)   Pulse 90   Resp 16   Ht 5\' 10"  (1.778 m)   Wt 204 lb (92.5 kg)   BMI 29.27 kg/m   Gen: WD/WN, NAD Head: [place]/AT, No temporalis wasting.  Ear/Nose/Throat:  Hearing grossly intact, nares w/o erythema or drainage Eyes: PER, EOMI, sclera nonicteric.  Neck: Supple, no masses.  No JVD.  Pulmonary:  Good air movement, no use of accessory muscles.  Cardiac: RRR Vascular: spider varicosities bilaterally behind knees   Gastrointestinal: soft, non-distended. No guarding/no peritoneal signs.  Musculoskeletal: M/S 5/5 throughout.  No deformity or atrophy.  Neurologic: Pain and light touch intact in extremities.  Symmetrical.  Speech is fluent. Motor exam as listed above. Psychiatric: Judgment intact, Mood & affect appropriate for pt's clinical situation. Dermatologic: No Venous rashes. No Ulcers Noted.  No changes consistent with cellulitis.        ASSESSMENT AND PLAN:  1. Varicose veins with pain Currently the patient's pain has greatly decreased following pregnancy.  Based on the patient's noninvasive studies she does not have evidence chronic venous insufficiency.  The patient will continue with medical grade 1 compression therapy in addition to continuing to utilize elevation and exercise.  The patient will also utilize NSAIDs when she does experience some pain.  Otherwise, the patient follow-up in our office on an as-needed basis.   Current Outpatient Medications on File Prior to Visit  Medication Sig Dispense Refill  . B Complex-C (SUPER B COMPLEX/VITAMIN C PO) Take 1 tablet by mouth daily.     . calcium carbonate (OS-CAL - DOSED IN MG OF ELEMENTAL CALCIUM) 1250 (500 Ca) MG tablet Take 1 tablet by mouth 2 (two) times daily with a meal.    . cholecalciferol  (VITAMIN D) 1000 units tablet Take 1,000 Units by mouth daily.    . Multiple Vitamin (MULTIVITAMIN WITH MINERALS) TABS tablet Take 1 tablet by mouth daily.    . Pancrelipase, Lip-Prot-Amyl, (CREON PO) Take by mouth.    . promethazine (PHENERGAN) 6.25 MG/5ML syrup Take by mouth.     No current facility-administered medications on file prior to visit.    There are no Patient Instructions on file for this visit. No follow-ups on file.   Kris Hartmann, NP  This note was completed with Sales executive.  Any errors are purely unintentional.

## 2019-05-09 ENCOUNTER — Ambulatory Visit: Payer: BC Managed Care – PPO | Attending: Internal Medicine

## 2019-05-09 DIAGNOSIS — Z23 Encounter for immunization: Secondary | ICD-10-CM

## 2019-05-09 NOTE — Progress Notes (Signed)
   Covid-19 Vaccination Clinic  Name:  [name]    MRN: [id number] DOB: 1980/08/28  05/09/2019  [name] was observed post Covid-19 immunization for 15 minutes without incidence. She was provided with Vaccine Information Sheet and instruction to access the V-Safe system.   [name] was instructed to call 911 with any severe reactions post vaccine: Marland Kitchen Difficulty breathing  . Swelling of your face and throat  . A fast heartbeat  . A bad rash all over your body  . Dizziness and weakness    Immunizations Administered    Name Date Dose VIS Date Route   Pfizer COVID-19 Vaccine 05/09/2019  9:30 AM 0.3 mL 02/19/2019 Intramuscular   Manufacturer: [place]   Lot: [id number]   [place]: [id number]

## 2019-06-01 ENCOUNTER — Ambulatory Visit: Payer: BC Managed Care – PPO | Attending: Internal Medicine

## 2019-06-01 DIAGNOSIS — Z23 Encounter for immunization: Secondary | ICD-10-CM

## 2019-06-01 NOTE — Progress Notes (Signed)
   Covid-19 Vaccination Clinic  Name:  [name]    MRN: [id number] DOB: [birth year]  06/01/2019  [name] was observed post Covid-19 immunization for 15 minutes without incident. She was provided with Vaccine Information Sheet and instruction to access the V-Safe system.   [name] was instructed to call 911 with any severe reactions post vaccine: Marland Kitchen Difficulty breathing  . Swelling of face and throat  . A fast heartbeat  . A bad rash all over body  . Dizziness and weakness   Immunizations Administered    Name Date Dose VIS Date Route   Pfizer COVID-19 Vaccine 06/01/2019  4:03 PM 0.3 mL 02/19/2019 Intramuscular   Manufacturer: [place]   Lot: [id number]   [place]: [id number]

## 2019-07-14 ENCOUNTER — Ambulatory Visit (INDEPENDENT_AMBULATORY_CARE_PROVIDER_SITE_OTHER): Payer: BC Managed Care – PPO | Admitting: Gastroenterology

## 2019-07-14 ENCOUNTER — Encounter: Payer: Self-pay | Admitting: Gastroenterology

## 2019-07-14 ENCOUNTER — Other Ambulatory Visit: Payer: Self-pay

## 2019-07-14 VITALS — BP 129/84 | HR 81 | Temp 98.3°F | Ht 70.0 in | Wt 194.2 lb

## 2019-07-14 DIAGNOSIS — R140 Abdominal distension (gaseous): Secondary | ICD-10-CM

## 2019-07-14 DIAGNOSIS — R109 Unspecified abdominal pain: Secondary | ICD-10-CM

## 2019-07-14 DIAGNOSIS — R1013 Epigastric pain: Secondary | ICD-10-CM

## 2019-07-14 DIAGNOSIS — K30 Functional dyspepsia: Secondary | ICD-10-CM | POA: Insufficient documentation

## 2019-07-14 DIAGNOSIS — R197 Diarrhea, unspecified: Secondary | ICD-10-CM | POA: Diagnosis not present

## 2019-07-14 MED ORDER — RIFAXIMIN 550 MG PO TABS
550.0000 mg | ORAL_TABLET | Freq: Two times a day (BID) | ORAL | 0 refills | Status: AC
Start: 2019-07-14 — End: 2019-07-28

## 2019-07-14 NOTE — Progress Notes (Signed)
Cephas Darby, MD [address]  [place]  [place], [place] [address]  Main: [phone]  Fax: [phone]    Gastroenterology Consultation  Referring Provider:     Marinda Elk, MD Primary Care Physician:  Marinda Elk, MD Primary Gastroenterologist:  Dr. Cephas Darby Reason for Consultation:     Abdominal bloating, mushy stools, nausea        HPI:   [name] is a 39 y.o. male referred by Dr. Carrie Mew, Wilhemena Durie, MD  for consultation & management of chronic abdominal bloating, mushy stools and nausea.  Patient had history of gastric sleeve, followed by [id number] and complicated by small bowel obstruction, s/p EXPLORATORY LAPAROTOMY W/ BOWEL RESECTION 04/06/2019   Patient reports that, since her duodenal switch, she has lost weight gradually.  She has been experiencing constellation of GI symptoms including intractable nausea, significant abdominal bloating worse in the of the day, epigastric discomfort, mushy bowel movements up to 8-9 times daily, one episode of bright red blood per rectum only.  Patient is closely followed by her bariatric surgeon as well as nutrition counselor at [facility].  She is on several nutrition supplements along with bariatric multivitamins.  She is also on Metamucil as well as Benefiber daily.  Patient recently underwent upper endoscopy with dilation of pylorus and injection of botulinum toxin as there was concern about retained food in the stomach and delayed gastric emptying.  Patient is also on Creon 40,000 units 2 capsules with meals and 1 capsule with snack which made her symptoms worse.  Therefore, she stopped taking Creon  Labs from last month revealed normal CBC, mildly prolonged PT, normal LFTs  She does not smoke or drink alcohol  NSAIDs: None  Antiplts/Anticoagulants/Anti thrombotics: None  GI Procedures: EGD by Gaynelle Arabian, bariatric surgeon at [facility] 05/07/2019  PROCEDURE PERFORMED: EGD with dilation of pylorus with hydrostatic balloon and injection of botulinum toxin  Past Medical History:  Diagnosis Date  . Asthma   . Breast cyst   . Breast mass   . Depression   . Fibrocystic breast   . Fibromyalgia   . Migraines     Past Surgical History:  Procedure Laterality Date  . BUNIONECTOMY Bilateral   . EYE SURGERY     blood clot in eye  . HERNIA REPAIR  1991, 2001   inguinal and umbilical  . LAPAROSCOPIC GASTRIC SLEEVE RESECTION  2015    Current Outpatient Medications:  .  albuterol (VENTOLIN HFA) 108 (90 Base) MCG/ACT inhaler, Inhale into the lungs., Disp: , Rfl:  .  B Complex-C (SUPER B COMPLEX/VITAMIN C PO), Take 1 tablet by mouth daily. , Disp: , Rfl:  .  calcium carbonate (OS-CAL - DOSED IN MG OF ELEMENTAL CALCIUM) 1250 (500 Ca) MG tablet, Take 1 tablet by mouth 2 (two) times daily with a meal., Disp: , Rfl:  .  cholecalciferol (VITAMIN D) 1000 units tablet, Take 1,000 Units by mouth daily., Disp: , Rfl:  .  dronabinol (MARINOL) 5 MG capsule, Take 5 mg by mouth 2 (two) times daily as needed., Disp: , Rfl:  .  lipase/protease/amylase (CREON) 36000 UNITS CPEP capsule, TAKE 2 CAPSULES BY MOUTH WITH EACH MEAL AND 1 CAPSULE WITH EACH SNACK, Disp: , Rfl:  .  Multiple Vitamins-Minerals (MULTI COMPLETE/IRON PO), Take by mouth., Disp: , Rfl:  .  ondansetron (ZOFRAN-ODT) 4 MG disintegrating tablet, , Disp: , Rfl:  .  oxyCODONE (OXY IR/ROXICODONE)  5 MG immediate release tablet, , Disp: , Rfl:  .  pantoprazole (PROTONIX) 40 MG tablet, Take 40 mg by mouth daily., Disp: , Rfl:  .  Psyllium (METAMUCIL FIBER PO), Take by mouth. 10 tablets a day, Disp: , Rfl:  .  Simethicone (GAS-X PO), Take by mouth., Disp: , Rfl:  .  zolmitriptan (ZOMIG-ZMT) 2.5 MG disintegrating tablet, , Disp: , Rfl:  .  rifaximin (XIFAXAN) 550 MG TABS tablet, Take 1 tablet (550 mg total) by mouth 2 (two) times daily for 14 days., Disp: 28 tablet, Rfl: 0  Family  History  Problem Relation Age of Onset  . Breast cancer Paternal Aunt   . Breast cancer Paternal Aunt   . Healthy Mother   . CVA Father   . Hypertension Father   . Diabetes Father   . Breast cancer Paternal Grandmother      Social History   Tobacco Use  . Smoking status: Former Smoker    Quit date: 06/21/2003    Years since quitting: 16.0  . Smokeless tobacco: Never Used  Substance Use Topics  . Alcohol use: No  . Drug use: No    Allergies as of 07/14/2019 - Review Complete 07/14/2019  Allergen Reaction Noted  . Penicillins Anaphylaxis 12/09/2013  . Chlorhexidine Hives 11/06/2016  . Chlorhexidine gluconate Hives 11/06/2016  . Clonazepam  02/17/2014  . Dexamethasone Hives 02/05/2015  . Doxycycline Nausea And Vomiting 12/09/2013  . Fluoxetine Other (See Comments) 02/17/2014  . Nsaids  05/14/2015  . Other  05/13/2015  . Hydrocodone-acetaminophen Hives and Rash 02/17/2014  . Tape Rash 02/17/2014  . Tuberculin ppd Rash 02/17/2014    Review of Systems:    All systems reviewed and negative except where noted in HPI.   Physical Exam:  BP 129/84 (BP Location: Left Arm, Patient Position: Sitting, Cuff Size: Normal)   Pulse 81   Temp 98.3 F (36.8 C) (Oral)   Ht 5\' 10"  (1.778 m)   Wt 194 lb 3 oz (88.1 kg)   BMI 27.86 kg/m  No LMP recorded. (Menstrual status: Lactating).  General:   Alert,  Well-developed, well-nourished, pleasant and cooperative in NAD Head:  Normocephalic and atraumatic. Eyes:  Sclera clear, no icterus.   Conjunctiva pink. Ears:  Normal auditory acuity. Nose:  No deformity, discharge, or lesions. Mouth:  No deformity or lesions,oropharynx pink & moist. Neck:  Supple; no masses or thyromegaly. Lungs:  Respirations even and unlabored.  Clear throughout to auscultation.   No wheezes, crackles, or rhonchi. No acute distress. Heart:  Regular rate and rhythm; no murmurs, clicks, rubs, or gallops. Abdomen:  Normal bowel sounds. Soft, non-tender and  moderately distended without masses, hepatosplenomegaly or hernias noted.  No guarding or rebound tenderness.   Rectal: Not performed Msk:  Symmetrical without gross deformities. Good, equal movement & strength bilaterally. Pulses:  Normal pulses noted. Extremities:  No clubbing or edema.  No cyanosis. Neurologic:  Alert and oriented x3;  grossly normal neurologically. Skin:  Intact without significant lesions or rashes. No jaundice. Psych:  Alert and cooperative. Normal mood and affect.  Imaging Studies: Reviewed  Assessment and Plan:   [name] is a 39 y.o. male with history of morbid obesity, history of gastric sleeve, followed by duodenal switch in 02/2019, small bowel obstruction s/p ex lap with resection in 03/2019, dilation and Botox injection of pylorus is seen in consultation for chronic symptoms of intractable nausea, abdominal bloating, nonbloody diarrhea, epigastric pain Patient is not tolerating Creon  Differentials  include H. pylori gastritis or small intestinal bacterial overgrowth or secondary pancreatic insufficiency or inflammatory bowel disease or IBS We will empirically treat for bacterial overgrowth with 2 weeks course of rifaximin 550 mg twice daily If her symptoms are persistent, will perform EGD and colonoscopy with TI evaluation, biopsies Also, advised her to cut back on high fiber intake as she is taking both Metamucil and Benefiber  encourage adequate intake of water instead of fruit juices Will check micronutrient and vitamin levels during next visit   Follow up in 4 to 6 weeks   Cephas Darby, MD

## 2019-08-04 ENCOUNTER — Encounter: Payer: Self-pay | Admitting: Gastroenterology

## 2019-08-05 ENCOUNTER — Telehealth (INDEPENDENT_AMBULATORY_CARE_PROVIDER_SITE_OTHER): Payer: Self-pay | Admitting: Gastroenterology

## 2019-08-05 ENCOUNTER — Telehealth: Payer: Self-pay

## 2019-08-05 DIAGNOSIS — R140 Abdominal distension (gaseous): Secondary | ICD-10-CM

## 2019-08-05 DIAGNOSIS — R1013 Epigastric pain: Secondary | ICD-10-CM

## 2019-08-05 DIAGNOSIS — R109 Unspecified abdominal pain: Secondary | ICD-10-CM

## 2019-08-05 DIAGNOSIS — R197 Diarrhea, unspecified: Secondary | ICD-10-CM

## 2019-08-05 NOTE — Telephone Encounter (Signed)
Zenpep 40,000 units 2 capsules with first bite of each meal and 1 capsule with snack  [name]

## 2019-08-05 NOTE — Progress Notes (Signed)
Colonoscopy w/ EGD scheduled for patient with Dr. Marius Ditch on Thursday 06/10/21Patient has been advised of COVID test on Tuesday 08/17/19.  Instructions will be sent via mychart.  Hard copy in mail with a rx for SuPrep.

## 2019-08-05 NOTE — Patient Instructions (Signed)
Colonoscopy w/EGD scheduled with Dr. Marius Ditch on 08/19/19 at [facility]. COVID Test [facility] on Tuesday 08/17/19 arrive between 8am-1pm.  Please review colonoscopy/EGD instructions sent via mychart.

## 2019-08-05 NOTE — Telephone Encounter (Signed)
Please advise to how many mg of xenpep patient should have and directions. He states he is supposed to pick up samples.  Thanks,  [place], [place]

## 2019-08-06 ENCOUNTER — Telehealth: Payer: Self-pay

## 2019-08-06 NOTE — Telephone Encounter (Signed)
LVM to let patient know her samples are available for pick up at front office.  Thanks,  [place], [place]

## 2019-08-12 ENCOUNTER — Other Ambulatory Visit: Payer: Self-pay

## 2019-08-12 MED ORDER — NA SULFATE-K SULFATE-MG SULF 17.5-3.13-1.6 GM/177ML PO SOLN: 354.0000 mL | Freq: Once | ORAL | 0 refills | Status: AC

## 2019-08-12 NOTE — Telephone Encounter (Signed)
Patient states she has not received the medication prescription for her prep. Sent to the pharmacy

## 2019-08-17 ENCOUNTER — Other Ambulatory Visit: Payer: Self-pay

## 2019-08-17 ENCOUNTER — Other Ambulatory Visit
Admission: RE | Admit: 2019-08-17 | Discharge: 2019-08-17 | Disposition: A | Discharge status: Home / Self Care | Payer: BC Managed Care – PPO | Source: Ambulatory Visit | Attending: Gastroenterology | Admitting: Gastroenterology

## 2019-08-17 DIAGNOSIS — Z01812 Encounter for preprocedural laboratory examination: Secondary | ICD-10-CM | POA: Insufficient documentation

## 2019-08-17 DIAGNOSIS — Z20822 Contact with and (suspected) exposure to covid-19: Secondary | ICD-10-CM | POA: Diagnosis not present

## 2019-08-17 LAB — SARS CORONAVIRUS 2 (TAT 6-24 HRS): SARS Coronavirus 2: NEGATIVE

## 2019-08-18 ENCOUNTER — Telehealth: Payer: Self-pay

## 2019-08-18 ENCOUNTER — Encounter: Payer: Self-pay | Admitting: Gastroenterology

## 2019-08-18 MED ORDER — ZENPEP 40000-126000 UNITS PO CPEP: ORAL_CAPSULE | ORAL | 1 refills | Status: DC

## 2019-08-18 NOTE — Addendum Note (Signed)
Addended by: Ulyess Blossom L on: 08/18/2019 11:02 AM   Modules accepted: Orders

## 2019-08-18 NOTE — Telephone Encounter (Signed)
Patient states that the Zenpep 40,000 units is helping her symptoms. She will be out of medication after today. Patient wants to know if she can have more samples or should we call in a prescription for her

## 2019-08-18 NOTE — Telephone Encounter (Signed)
Please send prescription  Zenpep 40,000 units 2 capsules with first bite of each meal and 1 capsule with snack  Thanks RV

## 2019-08-18 NOTE — Telephone Encounter (Signed)
Sent medication to pharmacy informed patient

## 2019-08-19 ENCOUNTER — Encounter
Admission: RE | Disposition: A | Discharge status: Home / Self Care | Payer: Self-pay | Source: Home / Self Care | Attending: Gastroenterology

## 2019-08-19 ENCOUNTER — Encounter: Payer: Self-pay | Admitting: Gastroenterology

## 2019-08-19 ENCOUNTER — Ambulatory Visit: Payer: BC Managed Care – PPO | Admitting: Anesthesiology

## 2019-08-19 ENCOUNTER — Ambulatory Visit
Admission: RE | Admit: 2019-08-19 | Discharge: 2019-08-19 | Disposition: A | Discharge status: Home / Self Care | Payer: BC Managed Care – PPO | Attending: Gastroenterology | Admitting: Gastroenterology

## 2019-08-19 DIAGNOSIS — Z888 Allergy status to other drugs, medicaments and biological substances status: Secondary | ICD-10-CM | POA: Diagnosis not present

## 2019-08-19 DIAGNOSIS — G43909 Migraine, unspecified, not intractable, without status migrainosus: Secondary | ICD-10-CM | POA: Insufficient documentation

## 2019-08-19 DIAGNOSIS — R197 Diarrhea, unspecified: Secondary | ICD-10-CM

## 2019-08-19 DIAGNOSIS — Z803 Family history of malignant neoplasm of breast: Secondary | ICD-10-CM | POA: Insufficient documentation

## 2019-08-19 DIAGNOSIS — K635 Polyp of colon: Secondary | ICD-10-CM | POA: Diagnosis not present

## 2019-08-19 DIAGNOSIS — Z91048 Other nonmedicinal substance allergy status: Secondary | ICD-10-CM | POA: Insufficient documentation

## 2019-08-19 DIAGNOSIS — Z880 Allergy status to penicillin: Secondary | ICD-10-CM | POA: Diagnosis not present

## 2019-08-19 DIAGNOSIS — Z823 Family history of stroke: Secondary | ICD-10-CM | POA: Diagnosis not present

## 2019-08-19 DIAGNOSIS — M1990 Unspecified osteoarthritis, unspecified site: Secondary | ICD-10-CM | POA: Insufficient documentation

## 2019-08-19 DIAGNOSIS — Z9884 Bariatric surgery status: Secondary | ICD-10-CM | POA: Insufficient documentation

## 2019-08-19 DIAGNOSIS — Z79899 Other long term (current) drug therapy: Secondary | ICD-10-CM | POA: Insufficient documentation

## 2019-08-19 DIAGNOSIS — M797 Fibromyalgia: Secondary | ICD-10-CM | POA: Insufficient documentation

## 2019-08-19 DIAGNOSIS — F329 Major depressive disorder, single episode, unspecified: Secondary | ICD-10-CM | POA: Diagnosis not present

## 2019-08-19 DIAGNOSIS — Z885 Allergy status to narcotic agent status: Secondary | ICD-10-CM | POA: Diagnosis not present

## 2019-08-19 DIAGNOSIS — D122 Benign neoplasm of ascending colon: Secondary | ICD-10-CM | POA: Insufficient documentation

## 2019-08-19 DIAGNOSIS — D123 Benign neoplasm of transverse colon: Secondary | ICD-10-CM | POA: Diagnosis not present

## 2019-08-19 DIAGNOSIS — Z87891 Personal history of nicotine dependence: Secondary | ICD-10-CM | POA: Diagnosis not present

## 2019-08-19 DIAGNOSIS — R1013 Epigastric pain: Secondary | ICD-10-CM

## 2019-08-19 DIAGNOSIS — G709 Myoneural disorder, unspecified: Secondary | ICD-10-CM | POA: Insufficient documentation

## 2019-08-19 DIAGNOSIS — J45909 Unspecified asthma, uncomplicated: Secondary | ICD-10-CM | POA: Insufficient documentation

## 2019-08-19 DIAGNOSIS — Z7952 Long term (current) use of systemic steroids: Secondary | ICD-10-CM | POA: Insufficient documentation

## 2019-08-19 DIAGNOSIS — N6019 Diffuse cystic mastopathy of unspecified breast: Secondary | ICD-10-CM | POA: Insufficient documentation

## 2019-08-19 DIAGNOSIS — Z881 Allergy status to other antibiotic agents status: Secondary | ICD-10-CM | POA: Diagnosis not present

## 2019-08-19 DIAGNOSIS — R140 Abdominal distension (gaseous): Secondary | ICD-10-CM

## 2019-08-19 DIAGNOSIS — Z791 Long term (current) use of non-steroidal anti-inflammatories (NSAID): Secondary | ICD-10-CM | POA: Diagnosis not present

## 2019-08-19 DIAGNOSIS — Z833 Family history of diabetes mellitus: Secondary | ICD-10-CM | POA: Insufficient documentation

## 2019-08-19 DIAGNOSIS — Z8249 Family history of ischemic heart disease and other diseases of the circulatory system: Secondary | ICD-10-CM | POA: Diagnosis not present

## 2019-08-19 DIAGNOSIS — R109 Unspecified abdominal pain: Secondary | ICD-10-CM

## 2019-08-19 HISTORY — PX: COLONOSCOPY WITH PROPOFOL: SHX5780

## 2019-08-19 HISTORY — PX: ESOPHAGOGASTRODUODENOSCOPY (EGD) WITH PROPOFOL: SHX5813

## 2019-08-19 SURGERY — COLONOSCOPY WITH PROPOFOL
Anesthesia: General

## 2019-08-19 MED ORDER — PROPOFOL 500 MG/50ML IV EMUL
INTRAVENOUS | Status: DC | PRN
  Administered 2019-08-19: 150 ug/kg/min via INTRAVENOUS

## 2019-08-19 MED ORDER — LIDOCAINE HCL (CARDIAC) PF 100 MG/5ML IV SOSY
PREFILLED_SYRINGE | INTRAVENOUS | Status: DC | PRN
  Administered 2019-08-19: 60 mg via INTRAVENOUS
  Administered 2019-08-19: 40 mg via INTRAVENOUS

## 2019-08-19 MED ORDER — PROPOFOL 500 MG/50ML IV EMUL
INTRAVENOUS | Status: AC
  Filled 2019-08-19: qty 50

## 2019-08-19 MED ORDER — MIDAZOLAM HCL 2 MG/2ML IJ SOLN
INTRAMUSCULAR | Status: AC
  Filled 2019-08-19: qty 2

## 2019-08-19 MED ORDER — PROPOFOL 10 MG/ML IV BOLUS
INTRAVENOUS | Status: AC
  Filled 2019-08-19: qty 20

## 2019-08-19 MED ORDER — SODIUM CHLORIDE 0.9 % IV SOLN
INTRAVENOUS | Status: DC
  Administered 2019-08-19: 1000 mL via INTRAVENOUS

## 2019-08-19 MED ORDER — PROPOFOL 10 MG/ML IV BOLUS
INTRAVENOUS | Status: DC | PRN
  Administered 2019-08-19: 40 mg via INTRAVENOUS

## 2019-08-19 MED ORDER — MIDAZOLAM HCL 2 MG/2ML IJ SOLN
INTRAMUSCULAR | Status: DC | PRN
  Administered 2019-08-19: 2 mg via INTRAVENOUS

## 2019-08-19 NOTE — Transfer of Care (Signed)
Immediate Anesthesia Transfer of Care Note  Patient: Brion Sossamon Vanderberg  Procedure(s) Performed: COLONOSCOPY WITH PROPOFOL (N/A ) ESOPHAGOGASTRODUODENOSCOPY (EGD) WITH PROPOFOL (N/A )  Patient Location: Endoscopy Unit  Anesthesia Type:General  Level of Consciousness: drowsy and patient cooperative  Airway & Oxygen Therapy: Patient Spontanous Breathing  Post-op Assessment: Report given to RN and Post -op Vital signs reviewed and stable  Post vital signs: Reviewed and stable  Last Vitals:  Vitals Value Taken Time  BP 99/55 08/19/19 1214  Temp    Pulse 68 08/19/19 1216  Resp 15 08/19/19 1216  SpO2 99 % 08/19/19 1216  Vitals shown include unvalidated device data.  Last Pain:  Vitals:   08/19/19 1056  TempSrc: Tympanic  PainSc: 0-No pain         Complications: No complications documented.

## 2019-08-19 NOTE — Op Note (Signed)
[facility] Gastroenterology Patient Name: [name] Procedure Date: 08/19/2019 11:09 AM MRN: [id number] Account #: [id number] Date of Birth: 09/30/1980 Admit Type: Outpatient Age: 39 Room: [facility] ENDO ROOM 3 Gender: Male Note Status: Finalized Procedure:             Colonoscopy Indications:           Clinically significant diarrhea of unexplained origin Providers:             Lin Landsman MD, MD Referring MD:          Precious Bard, MD (Referring MD) Medicines:             Monitored Anesthesia Care Complications:         No immediate complications. Estimated blood loss: None. Procedure:             Pre-Anesthesia Assessment:                        - Prior to the procedure, a History and Physical was                         performed, and patient medications and allergies were                         reviewed. The patient is competent. The risks and                         benefits of the procedure and the sedation options and                         risks were discussed with the patient. All questions                         were answered and informed consent was obtained.                         Patient identification and proposed procedure were                         verified by the physician, the nurse, the                         anesthesiologist, the anesthetist and the technician                         in the pre-procedure area in the procedure room in the                         endoscopy suite. Mental Status Examination: alert and                         oriented. Airway Examination: normal oropharyngeal                         airway and neck mobility. Respiratory Examination:                         clear to auscultation. CV Examination: normal.  Prophylactic Antibiotics: The patient does not require                         prophylactic antibiotics. Prior Anticoagulants: The                         patient has  taken no previous anticoagulant or                         antiplatelet agents. ASA Grade Assessment: II - A                         patient with mild systemic disease. After reviewing                         the risks and benefits, the patient was deemed in                         satisfactory condition to undergo the procedure. The                         anesthesia plan was to use monitored anesthesia care                         (MAC). Immediately prior to administration of                         medications, the patient was re-assessed for adequacy                         to receive sedatives. The heart rate, respiratory                         rate, oxygen saturations, blood pressure, adequacy of                         pulmonary ventilation, and response to care were                         monitored throughout the procedure. The physical                         status of the patient was re-assessed after the                         procedure.                        After obtaining informed consent, the colonoscope was                         passed under direct vision. Throughout the procedure,                         the patient's blood pressure, pulse, and oxygen                         saturations were monitored continuously. The  Colonoscope was introduced through the anus and                         advanced to the the cecum, identified by appendiceal                         orifice and ileocecal valve. The colonoscopy was                         performed with moderate difficulty due to significant                         looping and the patient's body habitus. Successful                         completion of the procedure was aided by applying                         abdominal pressure. The patient tolerated the                         procedure well. The quality of the bowel preparation                         was good. Findings:      The perianal  and digital rectal examinations were normal. Pertinent       negatives include normal sphincter tone and no palpable rectal lesions.      Normal mucosa was found in the entire colon.      A 3 mm polyp was found in the ascending colon. The polyp was sessile.       The polyp was removed with a cold biopsy forceps. Resection and       retrieval were complete.      A 5 mm polyp was found in the transverse colon. The polyp was sessile.       The polyp was removed with a cold snare. Resection and retrieval were       complete.      The retroflexed view of the distal rectum and anal verge was normal and       showed no anal or rectal abnormalities. Impression:            - Normal mucosa in the entire examined colon.                        - One 3 mm polyp in the ascending colon, removed with                         a cold biopsy forceps. Resected and retrieved.                        - One 5 mm polyp in the transverse colon, removed with                         a cold snare. Resected and retrieved.                        - The distal rectum and anal verge are normal on  retroflexion view. Recommendation:        - Discharge patient to home (with escort).                        - Resume previous diet today.                        - Continue present medications.                        - Await pathology results.                        - Repeat colonoscopy in 6 years for surveillance.                        - Return to my office as previously scheduled. Procedure Code(s):     --- Professional ---                        [phone], Colonoscopy, flexible; with removal of                         tumor(s), polyp(s), or other lesion(s) by snare                         technique                        45380, 50, Colonoscopy, flexible; with biopsy, single                         or multiple Diagnosis Code(s):     --- Professional ---                        K63.5, Polyp of colon                         R19.7, Diarrhea, unspecified CPT copyright 2019 American Medical Association. All rights reserved. The codes documented in this report are preliminary and upon coder review may  be revised to meet current compliance requirements. Dr. Ulyess Mort Lin Landsman MD, MD 08/19/2019 12:14:08 PM This report has been signed electronically. Number of Addenda: 0 Note Initiated On: 08/19/2019 11:09 AM Scope Withdrawal Time: 0 hours 11 minutes 37 seconds  Total Procedure Duration: 0 hours 19 minutes 8 seconds  Estimated Blood Loss:  Estimated blood loss: none.      [facility]

## 2019-08-19 NOTE — Anesthesia Preprocedure Evaluation (Addendum)
Anesthesia Evaluation  Patient identified by MRN, date of birth, ID band Patient awake    Reviewed: Allergy & Precautions, H&P , NPO status , Patient's Chart, lab work & pertinent test results  History of Anesthesia Complications Negative for: history of anesthetic complications  Airway Mallampati: II  TM Distance: >3 FB Neck ROM: full    Dental  (+) Chipped, Loose Braces:   Pulmonary neg shortness of breath, asthma , former smoker,    Pulmonary exam normal        Cardiovascular Exercise Tolerance: Good negative cardio ROS Normal cardiovascular exam     Neuro/Psych  Headaches, PSYCHIATRIC DISORDERS  Neuromuscular disease    GI/Hepatic negative GI ROS, Neg liver ROS,   Endo/Other  negative endocrine ROS  Renal/GU negative Renal ROS  negative genitourinary   Musculoskeletal  (+) Arthritis , Fibromyalgia -  Abdominal   Peds  Hematology negative hematology ROS (+)   Anesthesia Other Findings Past Medical History: No date: Asthma No date: Breast cyst No date: Breast mass No date: Depression No date: Fibrocystic breast No date: Fibromyalgia No date: Migraines  Past Surgical History: No date: BUNIONECTOMY; Bilateral No date: EYE SURGERY     Comment:  blood clot in eye 1991, 2001: HERNIA REPAIR     Comment:  inguinal and umbilical 2122: LAPAROSCOPIC GASTRIC SLEEVE RESECTION  BMI    Body Mass Index: 28.27 kg/m      Reproductive/Obstetrics negative OB ROS                            Anesthesia Physical Anesthesia Plan  ASA: II  Anesthesia Plan: General   Post-op Pain Management:    Induction: Intravenous  PONV Risk Score and Plan: Propofol infusion and TIVA  Airway Management Planned: Natural Airway and Nasal Cannula  Additional Equipment:   Intra-op Plan:   Post-operative Plan:   Informed Consent: I have reviewed the patients History and Physical, chart, labs and  discussed the procedure including the risks, benefits and alternatives for the proposed anesthesia with the patient or authorized representative who has indicated his/her understanding and acceptance.     Dental Advisory Given  Plan Discussed with: Anesthesiologist, CRNA and Surgeon  Anesthesia Plan Comments: (Patient initially declining to give urine for UPreg, stating that she "has never had sex with a man".  Patient consented for possible risks to fetus including but not limited to fetal death or malformation.  She voiced understanding.  Patient consented for risks of anesthesia including but not limited to:  - adverse reactions to medications - risk of intubation if required - damage to eyes, teeth, lips or other oral mucosa - nerve damage due to positioning  - sore throat or hoarseness - Damage to heart, brain, nerves, lungs, other parts of body or loss of life  Patient voiced understanding.)       Anesthesia Quick Evaluation

## 2019-08-19 NOTE — H&P (Signed)
Michael Darby, MD [address]  [place]  [place], [place] [address]  Main: [phone]  Fax: [phone] Pager: [phone]  Primary Care Physician:  Michael Elk, MD Primary Gastroenterologist:  Dr. Cephas Schroeder  Pre-Procedure History & Physical: HPI:  [name] is a 39 y.o. adult is here for an endoscopy and colonoscopy.   Past Medical History:  Diagnosis Date  . Asthma   . Breast cyst   . Breast mass   . Depression   . Fibrocystic breast   . Fibromyalgia   . Migraines     Past Surgical History:  Procedure Laterality Date  . BUNIONECTOMY Bilateral   . EYE SURGERY     blood clot in eye  . HERNIA REPAIR  1991, 2001   inguinal and umbilical  . LAPAROSCOPIC GASTRIC SLEEVE RESECTION  2015    Prior to Admission medications   Medication Sig Start Date End Date Taking? Authorizing Provider  dronabinol (MARINOL) 5 MG capsule Take 5 mg by mouth 2 (two) times daily as needed. 06/24/19  Yes [provider]  albuterol (VENTOLIN HFA) 108 (90 Base) MCG/ACT inhaler Inhale into the lungs.    [provider]  B Complex-C (SUPER B COMPLEX/VITAMIN C PO) Take 1 tablet by mouth daily.     [provider]  calcium carbonate (OS-CAL - DOSED IN MG OF ELEMENTAL CALCIUM) 1250 (500 Ca) MG tablet Take 1 tablet by mouth 2 (two) times daily with a meal.    [provider]  cholecalciferol (VITAMIN D) 1000 units tablet Take 1,000 Units by mouth daily.    [provider]  Multiple Vitamins-Minerals (MULTI COMPLETE/IRON PO) Take by mouth.    [provider]  ondansetron (ZOFRAN-ODT) 4 MG disintegrating tablet  04/10/19   [provider]  oxyCODONE (OXY IR/ROXICODONE) 5 MG immediate release tablet  12/29/18   [provider]  Pancrelipase, Lip-Prot-Amyl, (ZENPEP) 40000-126000 units CPEP Take 2 Capsule by mouth with the first bite of each meals and 1 capsule with snacks 08/18/19   [name], Michael Due, MD    pantoprazole (PROTONIX) 40 MG tablet Take 40 mg by mouth daily. 06/06/19   [provider]  Psyllium (METAMUCIL FIBER PO) Take by mouth. 10 tablets a day    [provider]  Simethicone (GAS-X PO) Take by mouth.    [provider]  zolmitriptan (ZOMIG-ZMT) 2.5 MG disintegrating tablet  03/24/19   [provider]    Allergies as of 08/05/2019 - Review Complete 08/05/2019  Allergen Reaction Noted  . Penicillins Anaphylaxis 12/09/2013  . Chlorhexidine Hives 11/06/2016  . Chlorhexidine gluconate Hives 11/06/2016  . Clonazepam  02/17/2014  . Dexamethasone Hives 02/05/2015  . Doxycycline Nausea And Vomiting 12/09/2013  . Fluoxetine Other (See Comments) 02/17/2014  . Nsaids  05/14/2015  . Other  05/13/2015  . Hydrocodone-acetaminophen Hives and Rash 02/17/2014  . Tape Rash 02/17/2014  . Tuberculin ppd Rash 02/17/2014    Family History  Problem Relation Age of Onset  . Breast cancer Paternal Aunt   . Breast cancer Paternal Aunt   . Healthy Mother   . CVA Father   . Hypertension Father   . Diabetes Father   . Breast cancer Paternal Grandmother     Social History   Socioeconomic History  . Marital status: Married    Spouse name: Not on file  . Number of children: Not on file  . Years of education: Not on file  . Highest education level:  Not on file  Occupational History  . Not on file  Tobacco Use  . Smoking status: Former Smoker    Quit date: 06/21/2003    Years since quitting: 16.1  . Smokeless tobacco: Never Used  Substance and Sexual Activity  . Alcohol use: No  . Drug use: No  . Sexual activity: Yes  Other Topics Concern  . Not on file  Social History Narrative  . Not on file   Social Determinants of Health   Financial Resource Strain:   . Difficulty of Paying Living Expenses:   Food Insecurity:   . Worried About Charity fundraiser in the Last Year:   . Arboriculturist in the Last Year:   Transportation Needs:   . Lexicographer (Medical):   Marland Kitchen Lack of Transportation (Non-Medical):   Physical Activity:   . Days of Exercise per Week:   . Minutes of Exercise per Session:   Stress:   . Feeling of Stress :   Social Connections:   . Frequency of Communication with Friends and Family:   . Frequency of Social Gatherings with Friends and Family:   . Attends Religious Services:   . Active Member of Clubs or Organizations:   . Attends Archivist Meetings:   Marland Kitchen Marital Status:   Intimate Partner Violence:   . Fear of Current or Ex-Partner:   . Emotionally Abused:   Marland Kitchen Physically Abused:   . Sexually Abused:     Review of Systems: See HPI, otherwise negative ROS  Physical Exam: BP 123/83   Pulse 60   Temp 97.8 F (36.6 C) (Tympanic)   Resp 18   Ht 5\' 10"  (1.778 m)   Wt 89.4 kg   SpO2 99%   BMI 28.27 kg/m  General:   Alert,  pleasant and cooperative in NAD Head:  Normocephalic and atraumatic. Neck:  Supple; no masses or thyromegaly. Lungs:  Clear throughout to auscultation.    Heart:  Regular rate and rhythm. Abdomen:  Soft, nontender and nondistended. Normal bowel sounds, without guarding, and without rebound.   Neurologic:  Alert and  oriented x4;  grossly normal neurologically.  Impression/Plan: [name] is here for an endoscopy and colonoscopy to be performed for intractable nausea, abdominal bloating, nonbloody diarrhea, epigastric pain  Risks, benefits, limitations, and alternatives regarding  endoscopy and colonoscopy have been reviewed with the patient.  Questions have been answered.  All parties agreeable.   Michael Sear, MD  08/19/2019, 11:21 AM

## 2019-08-19 NOTE — Op Note (Signed)
[facility] Gastroenterology Patient Name: [name] Procedure Date: 08/19/2019 11:10 AM MRN: [id number] Account #: [id number] Date of Birth: 1981/01/08 Admit Type: Outpatient Age: 39 Room: [facility] ENDO ROOM 3 Gender: Male Note Status: Finalized Procedure:             Upper GI endoscopy Indications:           Dyspepsia Providers:             Lin Landsman MD, MD Referring MD:          Precious Bard, MD (Referring MD) Medicines:             Monitored Anesthesia Care Complications:         No immediate complications. Estimated blood loss:                         Minimal. Procedure:             Pre-Anesthesia Assessment:                        - Prior to the procedure, a History and Physical was                         performed, and patient medications and allergies were                         reviewed. The patient is competent. The risks and                         benefits of the procedure and the sedation options and                         risks were discussed with the patient. All questions                         were answered and informed consent was obtained.                         Patient identification and proposed procedure were                         verified by the physician, the nurse, the                         anesthesiologist, the anesthetist and the technician                         in the pre-procedure area in the procedure room in the                         endoscopy suite. Mental Status Examination: alert and                         oriented. Airway Examination: normal oropharyngeal                         airway and neck mobility. Respiratory Examination:  clear to auscultation. CV Examination: normal.                         Prophylactic Antibiotics: The patient does not require                         prophylactic antibiotics. Prior Anticoagulants: The                         patient has taken no  previous anticoagulant or                         antiplatelet agents. ASA Grade Assessment: II - A                         patient with mild systemic disease. After reviewing                         the risks and benefits, the patient was deemed in                         satisfactory condition to undergo the procedure. The                         anesthesia plan was to use monitored anesthesia care                         (MAC). Immediately prior to administration of                         medications, the patient was re-assessed for adequacy                         to receive sedatives. The heart rate, respiratory                         rate, oxygen saturations, blood pressure, adequacy of                         pulmonary ventilation, and response to care were                         monitored throughout the procedure. The physical                         status of the patient was re-assessed after the                         procedure.                        After obtaining informed consent, the endoscope was                         passed under direct vision. Throughout the procedure,                         the patient's blood pressure, pulse, and oxygen  saturations were monitored continuously. The Endoscope                         was introduced through the mouth, and advanced to the                         second part of duodenum. The upper GI endoscopy was                         accomplished without difficulty. The patient tolerated                         the procedure fairly well. Findings:      Evidence of a patent vertical banded gastroplasty was found. A gastric       pouch with a normal size was found. The staple line appeared intact.       Evidence of a Silastic band was not seen. This was traversed. And, there       is evidence of duodenal switch, Biopsies were taken with a cold forceps       for Helicobacter pylori testing from gastric pouch.       The examined alimentary limb was normal.      The gastroesophageal junction and examined esophagus were normal. Impression:            - Patent vertical banded gastroplasty with a                         normal-sized pouch and intact staple line. Biopsied.                        - Normal examined alimentary                        - Normal gastroesophageal junction and esophagus. Recommendation:        - Await pathology results.                        - Continue present medications.                        - Proceed with colonoscopy as scheduled                        See colonoscopy report Procedure Code(s):     --- Professional ---                        [phone], Esophagogastroduodenoscopy, flexible,                         transoral; with biopsy, single or multiple Diagnosis Code(s):     --- Professional ---                        O35.00, Bariatric surgery status                        R10.13, Epigastric pain CPT copyright 2019 American Medical Association. All rights reserved. The codes documented in this report are preliminary and upon coder review may  be revised to meet current compliance requirements. Dr. Ulyess Mort  Lin Landsman MD, MD 08/19/2019 11:48:55 AM This report has been signed electronically. Number of Addenda: 0 Note Initiated On: 08/19/2019 11:10 AM Estimated Blood Loss:  Estimated blood loss was minimal.      [facility]

## 2019-08-20 ENCOUNTER — Encounter: Payer: Self-pay | Admitting: Gastroenterology

## 2019-08-20 LAB — SURGICAL PATHOLOGY

## 2019-08-20 NOTE — Anesthesia Postprocedure Evaluation (Signed)
Anesthesia Post Note  Patient: Shameer Molstad Zuleta  Procedure(s) Performed: COLONOSCOPY WITH PROPOFOL (N/A ) ESOPHAGOGASTRODUODENOSCOPY (EGD) WITH PROPOFOL (N/A )  Patient location during evaluation: Endoscopy Anesthesia Type: General Level of consciousness: awake and alert Pain management: pain level controlled Vital Signs Assessment: post-procedure vital signs reviewed and stable Respiratory status: spontaneous breathing, nonlabored ventilation, respiratory function stable and patient connected to nasal cannula oxygen Cardiovascular status: blood pressure returned to baseline and stable Postop Assessment: no apparent nausea or vomiting Anesthetic complications: no   No complications documented.   Last Vitals:  Vitals:   08/19/19 1213 08/19/19 1233  BP: (!) 99/55 125/89  Pulse: 66   Resp: 16   Temp: 36.7 C   SpO2: 98%     Last Pain:  Vitals:   08/20/19 0743  TempSrc:   PainSc: 0-No pain                 Precious Haws Dwyane Dupree

## 2019-08-31 ENCOUNTER — Ambulatory Visit (INDEPENDENT_AMBULATORY_CARE_PROVIDER_SITE_OTHER): Payer: BC Managed Care – PPO | Admitting: Gastroenterology

## 2019-08-31 ENCOUNTER — Other Ambulatory Visit: Payer: Self-pay

## 2019-08-31 ENCOUNTER — Encounter: Payer: Self-pay | Admitting: Gastroenterology

## 2019-08-31 VITALS — BP 131/80 | HR 64 | Temp 98.0°F | Wt 188.2 lb

## 2019-08-31 DIAGNOSIS — F5101 Primary insomnia: Secondary | ICD-10-CM | POA: Diagnosis not present

## 2019-08-31 DIAGNOSIS — Z9884 Bariatric surgery status: Secondary | ICD-10-CM

## 2019-08-31 DIAGNOSIS — R140 Abdominal distension (gaseous): Secondary | ICD-10-CM

## 2019-08-31 DIAGNOSIS — K529 Noninfective gastroenteritis and colitis, unspecified: Secondary | ICD-10-CM | POA: Diagnosis not present

## 2019-08-31 MED ORDER — AMITRIPTYLINE HCL 25 MG PO TABS: 25.0000 mg | ORAL_TABLET | Freq: Every day | ORAL | 1 refills | Status: DC

## 2019-08-31 NOTE — Progress Notes (Signed)
Michael Darby, MD [address]  [place]  [place], [place] [address]  Main: [phone]  Fax: [phone]    Gastroenterology Consultation  Referring Provider:     Marinda Elk, MD Primary Care Physician:  Marinda Elk, MD Primary Gastroenterologist:  Dr. Cephas Schroeder Reason for Consultation:     Abdominal bloating, mushy stools, nausea        HPI:   [name] is a 39 y.o. adult referred by Dr. Carrie Mew, Wilhemena Durie, MD  for consultation & management of chronic abdominal bloating, mushy stools and nausea.  Patient had history of gastric sleeve, followed by [facility] 15/1761 and complicated by small bowel obstruction, s/p EXPLORATORY LAPAROTOMY W/ BOWEL RESECTION 04/06/2019   Patient reports that, since her duodenal switch, she has lost weight gradually.  She has been experiencing constellation of GI symptoms including intractable nausea, significant abdominal bloating worse in the of the day, epigastric discomfort, mushy bowel movements up to 8-9 times daily, one episode of bright red blood per rectum only.  Patient is closely followed by her bariatric surgeon as well as nutrition counselor at [place].  She is on several nutrition supplements along with bariatric multivitamins.  She is also on Metamucil as well as Benefiber daily.  Patient recently underwent upper endoscopy with dilation of pylorus and injection of botulinum toxin as there was concern about retained food in the stomach and delayed gastric emptying.  Patient is also on Creon 40,000 units 2 capsules with meals and 1 capsule with snack which made her symptoms worse.  Therefore, she stopped taking Creon  Labs from last month revealed normal CBC, mildly prolonged PT, normal LFTs  She does not smoke or drink alcohol  Follow-up visit 08/31/2019 [name] underwent work-up of abdominal bloating, increased bowel frequency.  EGD was unremarkable other than evidence of  vertical gastroplasty with duodenal switch.  No evidence of H. pylori.  Colonoscopy revealed benign tubular adenomas only.  I have empirically tried Zenpep which she tolerated it well and her symptoms were resolved.  I also try 2 weeks course of rifaximin which did not provide much relief.  She said she has been doing very well until yesterday when she had eaten [employer] nuggets with sauce.  Following that, she had 3-4 episodes of mushy bowel movements associated with abdominal bloating.  Bloating has gotten worse today and she had 3 more bowel movements today, does not have good appetite today.  She also thinks her GI symptoms get worse before each menstrual cycle.  She does acknowledge that she has severe insomnia, sleeps about 2 hours only and general stress taking care of 7 kids at home. She has gradual loss of weight as expected.  Her most recent work-up at Dr. Synthia Innocent office in 07/2019 revealed normal iron studies, B12, folate, magnesium, vitamin A, D, E and K levels.  NSAIDs: None  Antiplts/Anticoagulants/Anti thrombotics: None  GI Procedures: EGD by Gaynelle Arabian, bariatric surgeon at [facility] 05/07/2019  PROCEDURE PERFORMED: EGD with dilation of pylorus with hydrostatic balloon and injection of botulinum toxin  EGD and colonoscopy 08/19/2019 - Patent vertical banded gastroplasty with a normal-sized pouch and intact staple line. Biopsied. - Normal examined alimentary limb - Normal gastroesophageal junction and esophagus.  -Normal mucosa of the entire examined colon - One 3 mm polyp in the ascending colon, removed with a cold biopsy forceps. Resected and retrieved. - One 5 mm polyp in the transverse colon, removed  with a cold snare. Resected and retrieved. - The distal rectum and anal verge are normal on retroflexion view.  DIAGNOSIS:  A. STOMACH, RANDOM; COLD BIOPSY:  - GASTRIC OXYNTIC MUCOSA WITH NO SIGNIFICANT HISTOPATHOLOGIC CHANGE.  - NEGATIVE FOR H. PYLORI, DYSPLASIA, AND  MALIGNANCY.   B. COLON POLYP, ASCENDING; COLD BIOPSY:  - TUBULAR ADENOMA.  - NEGATIVE FOR HIGH-GRADE DYSPLASIA AND MALIGNANCY.   C. COLON POLYP, TRANSVERSE; COLD SNARE:  - TUBULAR ADENOMA.  - NEGATIVE FOR HIGH-GRADE DYSPLASIA AND MALIGNANCY.   Past Medical History:  Diagnosis Date   Asthma    Breast cyst    Breast mass    Depression    Fibrocystic breast    Fibromyalgia    Migraines     Past Surgical History:  Procedure Laterality Date   BUNIONECTOMY Bilateral    COLONOSCOPY WITH PROPOFOL N/A 08/19/2019   Procedure: COLONOSCOPY WITH PROPOFOL;  Surgeon: Lin Landsman, MD;  Location: [facility] ENDOSCOPY;  Service: Gastroenterology;  Laterality: N/A;   ESOPHAGOGASTRODUODENOSCOPY (EGD) WITH PROPOFOL N/A 08/19/2019   Procedure: ESOPHAGOGASTRODUODENOSCOPY (EGD) WITH PROPOFOL;  Surgeon: Lin Landsman, MD;  Location: [facility] ENDOSCOPY;  Service: Gastroenterology;  Laterality: N/A;   EYE SURGERY     blood clot in eye   HERNIA REPAIR  1991, 2001   inguinal and umbilical   LAPAROSCOPIC GASTRIC SLEEVE RESECTION  2015    Current Outpatient Medications:    albuterol (VENTOLIN HFA) 108 (90 Base) MCG/ACT inhaler, Inhale into the lungs., Disp: , Rfl:    B Complex-C (SUPER B COMPLEX/VITAMIN C PO), Take 1 tablet by mouth daily. , Disp: , Rfl:    calcium carbonate (OS-CAL - DOSED IN MG OF ELEMENTAL CALCIUM) 1250 (500 Ca) MG tablet, Take 1 tablet by mouth 2 (two) times daily with a meal., Disp: , Rfl:    cholecalciferol (VITAMIN D) 1000 units tablet, Take 1,000 Units by mouth daily., Disp: , Rfl:    dronabinol (MARINOL) 5 MG capsule, Take 5 mg by mouth 2 (two) times daily as needed., Disp: , Rfl:    Multiple Vitamins-Minerals (MULTI COMPLETE/IRON PO), Take by mouth., Disp: , Rfl:    ondansetron (ZOFRAN-ODT) 4 MG disintegrating tablet, , Disp: , Rfl:    Pancrelipase, Lip-Prot-Amyl, (ZENPEP) 40000-126000 units CPEP, Take 2 Capsule by mouth with the first bite of each meals  and 1 capsule with snacks, Disp: 240 capsule, Rfl: 1   pantoprazole (PROTONIX) 40 MG tablet, Take 40 mg by mouth daily., Disp: , Rfl:    Simethicone (GAS-X PO), Take by mouth., Disp: , Rfl:    zolmitriptan (ZOMIG-ZMT) 2.5 MG disintegrating tablet, , Disp: , Rfl:    amitriptyline (ELAVIL) 25 MG tablet, Take 1 tablet (25 mg total) by mouth at bedtime., Disp: 30 tablet, Rfl: 1  Family History  Problem Relation Age of Onset   Breast cancer Paternal 23    Breast cancer Paternal 19    Healthy Mother    CVA Father    Hypertension Father    Diabetes Father    Breast cancer Paternal Grandmother      Social History   Tobacco Use   Smoking status: Former Smoker    Quit date: 06/21/2003    Years since quitting: 16.2   Smokeless tobacco: Never Used  Substance Use Topics   Alcohol use: No   Drug use: No    Allergies as of 08/31/2019 - Review Complete 08/31/2019  Allergen Reaction Noted   Penicillins Anaphylaxis 12/09/2013   Chlorhexidine Hives 11/06/2016   Chlorhexidine  gluconate Hives 11/06/2016   Clonazepam  02/17/2014   Dexamethasone Hives 02/05/2015   Doxycycline Nausea And Vomiting 12/09/2013   Fluoxetine Other (See Comments) 02/17/2014   Nsaids  05/14/2015   Other  05/13/2015   Hydrocodone-acetaminophen Hives and Rash 02/17/2014   Tape Rash 02/17/2014   Tuberculin ppd Rash 02/17/2014    Review of Systems:    All systems reviewed and negative except where noted in HPI.   Physical Exam:  BP 131/80 (BP Location: Left Arm, Patient Position: Sitting, Cuff Size: Normal)    Pulse 64    Temp 98 F (36.7 C) (Oral)    Wt 188 lb 4 oz (85.4 kg)    BMI 27.01 kg/m  No LMP recorded. (Menstrual status: Lactating).  General:   Alert,  Well-developed, well-nourished, pleasant and cooperative in NAD Head:  Normocephalic and atraumatic. Eyes:  Sclera clear, no icterus.   Conjunctiva pink. Ears:  Normal auditory acuity. Nose:  No deformity, discharge, or  lesions. Mouth:  No deformity or lesions,oropharynx pink & moist. Neck:  Supple; no masses or thyromegaly. Lungs:  Respirations even and unlabored.  Clear throughout to auscultation.   No wheezes, crackles, or rhonchi. No acute distress. Heart:  Regular rate and rhythm; no murmurs, clicks, rubs, or gallops. Abdomen:  Normal bowel sounds. Soft, non-tender and moderately distended without masses, hepatosplenomegaly or hernias noted.  No guarding or rebound tenderness.   Rectal: Not performed Msk:  Symmetrical without gross deformities. Good, equal movement & strength bilaterally. Pulses:  Normal pulses noted. Extremities:  No clubbing or edema.  No cyanosis. Neurologic:  Alert and oriented x3;  grossly normal neurologically. Skin:  Intact without significant lesions or rashes. No jaundice. Psych:  Alert and cooperative. Normal mood and affect.  Imaging Studies: Reviewed  Assessment and Plan:   [name] is a 39 y.o. adult with history of morbid obesity, history of gastric sleeve, followed by duodenal switch in 02/2019, small bowel obstruction s/p ex lap with resection in 03/2019, dilation and Botox injection of pylorus is seen in consultation for chronic symptoms of intractable nausea, abdominal bloating, nonbloody diarrhea, epigastric pain Patient is not tolerating Creon.  Her symptoms have significantly responded to Zenpep 40K 2 capsules with each meal and 1 with snack. EGD and colonoscopy were unremarkable.  She is also empirically treated with 2 weeks course of Xifaxan Now going through another brief flareup since yesterday.    Increase Zenpep to 3 capsules with each meal Given the fact that she has severe insomnia and personal stress, discussed about trial of amitriptyline 25 mg at bedtime.  Patient reported that she used to stay on medication before she had a baby.  She stopped during pregnancy and lactation.  She is willing to try amitriptyline If this does not help, will give another  round of Xifaxan for 2 weeks  Tubular adenomas of the colon Recommend surveillance colonoscopy at age 31  Follow up in 4 to 67 weeks   Michael Darby, MD

## 2019-09-17 ENCOUNTER — Other Ambulatory Visit: Payer: Self-pay

## 2019-09-17 ENCOUNTER — Encounter (HOSPITAL_COMMUNITY): Payer: Self-pay

## 2019-09-17 ENCOUNTER — Emergency Department (HOSPITAL_COMMUNITY)
Admission: EM | Admit: 2019-09-17 | Discharge: 2019-09-18 | Disposition: A | Discharge status: Home / Self Care | Payer: BC Managed Care – PPO | Attending: Emergency Medicine | Admitting: Emergency Medicine

## 2019-09-17 DIAGNOSIS — J45909 Unspecified asthma, uncomplicated: Secondary | ICD-10-CM | POA: Insufficient documentation

## 2019-09-17 DIAGNOSIS — Z87891 Personal history of nicotine dependence: Secondary | ICD-10-CM | POA: Diagnosis not present

## 2019-09-17 DIAGNOSIS — R109 Unspecified abdominal pain: Secondary | ICD-10-CM | POA: Diagnosis present

## 2019-09-17 DIAGNOSIS — Z9884 Bariatric surgery status: Secondary | ICD-10-CM | POA: Insufficient documentation

## 2019-09-17 DIAGNOSIS — R1032 Left lower quadrant pain: Secondary | ICD-10-CM

## 2019-09-17 DIAGNOSIS — Z7951 Long term (current) use of inhaled steroids: Secondary | ICD-10-CM | POA: Insufficient documentation

## 2019-09-17 LAB — URINALYSIS, ROUTINE W REFLEX MICROSCOPIC
Bilirubin Urine: NEGATIVE
Glucose, UA: NEGATIVE mg/dL
Ketones, ur: NEGATIVE mg/dL
Nitrite: NEGATIVE
Protein, ur: NEGATIVE mg/dL
Specific Gravity, Urine: 1.02 (ref 1.005–1.030)
pH: 5 (ref 5.0–8.0)

## 2019-09-17 MED ORDER — SODIUM CHLORIDE 0.9 % IV BOLUS
1000.0000 mL | Freq: Once | INTRAVENOUS | Status: AC
  Administered 2019-09-18: 1000 mL via INTRAVENOUS

## 2019-09-17 MED ORDER — ONDANSETRON HCL 4 MG/2ML IJ SOLN
4.0000 mg | Freq: Once | INTRAMUSCULAR | Status: AC
  Administered 2019-09-18: 4 mg via INTRAVENOUS
  Filled 2019-09-17: qty 2

## 2019-09-17 MED ORDER — MORPHINE SULFATE (PF) 4 MG/ML IV SOLN
4.0000 mg | Freq: Once | INTRAVENOUS | Status: AC
  Administered 2019-09-18: 4 mg via INTRAVENOUS
  Filled 2019-09-17: qty 1

## 2019-09-17 NOTE — ED Triage Notes (Signed)
LLQ abdominal pain and nanusea x 2 days. Pt sts hx of sbo back in January.

## 2019-09-17 NOTE — ED Provider Notes (Signed)
[facility] DEPT Provider Note   CSN: [id number] Arrival date & time: 09/17/19  2324     History Chief Complaint  Patient presents with  . Abdominal Pain    [name] is a 39 y.o. adult.  Patient is a 38 year old male with past medical history of prior bariatric surgery in 2015.  Patient reports a LAP-BAND procedure was performed in [facility].  She has been experiencing intermittent abdominal pain since and was diagnosed in January with a small bowel obstruction.  This required adhesionolysis.  Patient presents today with complaints of intermittent cramping in her abdomen and bloating for the past 2 days.  She denies any vomiting or diarrhea.  She shows me she feels as though she has the urge to have a bowel movement but cannot.  She denies any fevers or chills.  She denies any urinary complaints.  She denies any vaginal bleeding or discharge.  The history is provided by the patient.       Past Medical History:  Diagnosis Date  . Asthma   . Breast cyst   . Breast mass   . Depression   . Fibrocystic breast   . Fibromyalgia   . Migraines     Patient Active Problem List   Diagnosis Date Noted  . Abdominal bloating with cramps   . Diarrhea   . Dyspepsia   . Delayed gastric emptying 07/14/2019  . 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 Abdominal pain 04/09/2019  . 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/2018  . Dislocation of patellofemoral joint 10/13/2017  . Labor and delivery indication for care or intervention 02/15/2017  . Pregnancy 01/01/2017  . Nausea and vomiting in pregnancy 11/06/2016  . Encounter for procreative genetic counseling 08/02/2016  . Umbilical hernia without obstruction and without gangrene   . Norovirus 06/24/2016  . ADHD (attention deficit hyperactivity disorder) 06/20/2016  . Anxiety 06/20/2016  . Asthma without status asthmaticus 06/20/2016  . Depression 06/20/2016   . Fibrocystic breast disease 06/20/2016  . Fibromyalgia 06/20/2016  . Migraine 06/20/2016  . Ovarian cyst 06/20/2016  . Morbid obesity ([place]) 02/24/2014  . Headache 07/26/2013  . Pre-syncope 07/26/2013  . KNEE PAIN, BILATERAL 01/02/2010  . TENDINITIS, PATELLAR 01/02/2010    Past Surgical History:  Procedure Laterality Date  . BUNIONECTOMY Bilateral   . COLONOSCOPY WITH PROPOFOL N/A 08/19/2019   Procedure: COLONOSCOPY WITH PROPOFOL;  Surgeon: Lin Landsman, MD;  Location: [facility] ENDOSCOPY;  Service: Gastroenterology;  Laterality: N/A;  . ESOPHAGOGASTRODUODENOSCOPY (EGD) WITH PROPOFOL N/A 08/19/2019   Procedure: ESOPHAGOGASTRODUODENOSCOPY (EGD) WITH PROPOFOL;  Surgeon: Lin Landsman, MD;  Location: [facility] ENDOSCOPY;  Service: Gastroenterology;  Laterality: N/A;  . EYE SURGERY     blood clot in eye  . HERNIA REPAIR  1991, 2001   inguinal and umbilical  . LAPAROSCOPIC GASTRIC SLEEVE RESECTION  2015     OB History    Gravida  2   Para      Term      Preterm      AB      Living  0     SAB  0   TAB      Ectopic      Multiple      Live Births              Family History  Problem Relation Age of Onset  . Breast cancer Paternal Aunt   . Breast cancer Paternal Aunt   .  Healthy Mother   . CVA Father   . Hypertension Father   . Diabetes Father   . Breast cancer Paternal Grandmother     Social History   Tobacco Use  . Smoking status: Former Smoker    Quit date: 06/21/2003    Years since quitting: 16.2  . Smokeless tobacco: Never Used  Substance Use Topics  . Alcohol use: No  . Drug use: No    Home Medications Prior to Admission medications   Medication Sig Start Date End Date Taking? Authorizing Provider  albuterol (VENTOLIN HFA) 108 (90 Base) MCG/ACT inhaler Inhale into the lungs.    [provider]  amitriptyline (ELAVIL) 25 MG tablet Take 1 tablet (25 mg total) by mouth at bedtime. 08/31/19   Lin Landsman, MD  B Complex-C  (SUPER B COMPLEX/VITAMIN C PO) Take 1 tablet by mouth daily.     [provider]  calcium carbonate (OS-CAL - DOSED IN MG OF ELEMENTAL CALCIUM) 1250 (500 Ca) MG tablet Take 1 tablet by mouth 2 (two) times daily with a meal.    [provider]  cholecalciferol (VITAMIN D) 1000 units tablet Take 1,000 Units by mouth daily.    [provider]  dronabinol (MARINOL) 5 MG capsule Take 5 mg by mouth 2 (two) times daily as needed. 06/24/19   [provider]  Multiple Vitamins-Minerals (MULTI COMPLETE/IRON PO) Take by mouth.    [provider]  ondansetron (ZOFRAN-ODT) 4 MG disintegrating tablet  04/10/19   [provider]  Pancrelipase, Lip-Prot-Amyl, (ZENPEP) 40000-126000 units CPEP Take 2 Capsule by mouth with the first bite of each meals and 1 capsule with snacks 08/18/19   Vanga, Tally Due, MD  pantoprazole (PROTONIX) 40 MG tablet Take 40 mg by mouth daily. 06/06/19   [provider]  Simethicone (GAS-X PO) Take by mouth.    [provider]  zolmitriptan (ZOMIG-ZMT) 2.5 MG disintegrating tablet  03/24/19   [provider]    Allergies    Penicillins, Chlorhexidine, Chlorhexidine gluconate, Clonazepam, Dexamethasone, Doxycycline, Fluoxetine, Nsaids, Other, Hydrocodone-acetaminophen, Tape, and Tuberculin ppd  Review of Systems   Review of Systems  All other systems reviewed and are negative.   Physical Exam Updated Vital Signs BP 116/90 (BP Location: Right Arm)   Pulse 99   Temp 98.3 F (36.8 C) (Oral)   Resp 18   SpO2 97%   Physical Exam Vitals and nursing note reviewed.  Constitutional:      General: Michael J Azzara "Jovon" is not in acute distress.    Appearance: Michael J Johannes "Jovon" is well-developed. Michael J Carrozza "Jovon" is not diaphoretic.  HENT:     Head: Normocephalic and atraumatic.  Cardiovascular:     Rate and Rhythm: Normal rate and regular rhythm.     Heart sounds: No murmur heard.  No friction  rub. No gallop.   Pulmonary:     Effort: Pulmonary effort is normal. No respiratory distress.     Breath sounds: Normal breath sounds. No wheezing.  Abdominal:     General: Bowel sounds are normal. There is no distension.     Palpations: Abdomen is soft.     Tenderness: There is abdominal tenderness. There is no right CVA tenderness, left CVA tenderness, guarding or rebound.     Comments: There is tenderness to palpation in the left mid abdomen.  Musculoskeletal:        General: Normal range of motion.     Cervical back: Normal range  of motion and neck supple.  Skin:    General: Skin is warm and dry.  Neurological:     Mental Status: Michael J Truxillo "Jovon" is alert and oriented to person, place, and time.     ED Results / Procedures / Treatments   Labs (all labs ordered are listed, but only abnormal results are displayed) Labs Reviewed  URINALYSIS, ROUTINE W REFLEX MICROSCOPIC - Abnormal; Notable for the following components:      Result Value   APPearance CLOUDY (*)    Hgb urine dipstick SMALL (*)    Leukocytes,Ua MODERATE (*)    Bacteria, UA RARE (*)    All other components within normal limits  LIPASE, BLOOD  COMPREHENSIVE METABOLIC PANEL  CBC  I-STAT BETA HCG BLOOD, ED ([facility], [facility], [facility] ONLY)    EKG None  Radiology No results found.  Procedures Procedures (including critical care time)  Medications Ordered in ED Medications  sodium chloride 0.9 % bolus 1,000 mL (has no administration in time range)  morphine 4 MG/ML injection 4 mg (has no administration in time range)  ondansetron (ZOFRAN) injection 4 mg (has no administration in time range)    ED Course  I have reviewed the triage vital signs and the nursing notes.  Pertinent labs & imaging results that were available during my care of the patient were reviewed by me and considered in my medical decision making (see chart for details).    MDM Rules/Calculators/A&P  Patient with history of prior lap banding in  2015 presenting with complaints of abdominal cramping and bloating for the past 2 days.  Patient had a bowel obstruction related to adhesions in January of this year.  Symptoms today feel similar.  On exam, vitals are stable and she is afebrile.  There is tenderness in the periumbilical/left lower quadrant, however no peritoneal signs are present.  Her work-up shows basically unremarkable laboratory studies and CT scan showing no acute intra-abdominal process.  Patient is feeling better after receiving IV fluids and medications here.  Patient seems appropriate for discharge with outpatient follow-up.  Final Clinical Impression(s) / ED Diagnoses Final diagnoses:  None    Rx / DC Orders ED Discharge Orders    None       Veryl Speak, MD 09/18/19 [phone]

## 2019-09-18 ENCOUNTER — Emergency Department (HOSPITAL_COMMUNITY): Payer: BC Managed Care – PPO

## 2019-09-18 ENCOUNTER — Encounter (HOSPITAL_COMMUNITY): Payer: Self-pay

## 2019-09-18 LAB — COMPREHENSIVE METABOLIC PANEL
ALT: 18 U/L (ref 0–44)
AST: 16 U/L (ref 15–41)
Albumin: 3.6 g/dL (ref 3.5–5.0)
Alkaline Phosphatase: 63 U/L (ref 38–126)
Anion gap: 10 (ref 5–15)
BUN: 7 mg/dL (ref 6–20)
CO2: 25 mmol/L (ref 22–32)
Calcium: 8.5 mg/dL — ABNORMAL LOW (ref 8.9–10.3)
Chloride: 105 mmol/L (ref 98–111)
Creatinine, Ser: 0.72 mg/dL (ref 0.44–1.00)
GFR calc Af Amer: >60 mL/min (ref >60)
GFR calc non Af Amer: >60 mL/min (ref >60)
Glucose, Bld: 101 mg/dL — ABNORMAL HIGH (ref 70–99)
Potassium: 3.3 mmol/L — ABNORMAL LOW (ref 3.5–5.1)
Sodium: 140 mmol/L (ref 135–145)
Total Bilirubin: 0.3 mg/dL (ref 0.3–1.2)
Total Protein: 6.6 g/dL (ref 6.5–8.1)

## 2019-09-18 LAB — LIPASE, BLOOD: Lipase: 25 U/L (ref 11–51)

## 2019-09-18 LAB — CBC
HCT: 38.2 % (ref 36.0–46.0)
Hemoglobin: 12.6 g/dL (ref 12.0–15.0)
MCH: 29.8 pg (ref 26.0–34.0)
MCHC: 33 g/dL (ref 30.0–36.0)
MCV: 90.3 fL (ref 80.0–100.0)
Platelets: 289 10*3/uL (ref 150–400)
RBC: 4.23 MIL/uL (ref 3.87–5.11)
RDW: 12.2 % (ref 11.5–15.5)
WBC: 5.7 10*3/uL (ref 4.0–10.5)
nRBC: 0 % (ref 0.0–0.2)

## 2019-09-18 LAB — I-STAT BETA HCG BLOOD, ED (MC, WL, AP ONLY): I-stat hCG, quantitative: <5 m[IU]/mL (ref <5)

## 2019-09-18 MED ORDER — IOHEXOL 300 MG/ML  SOLN
100.0000 mL | Freq: Once | INTRAMUSCULAR | Status: AC | PRN
  Administered 2019-09-18: 100 mL via INTRAVENOUS

## 2019-09-18 MED ORDER — SODIUM CHLORIDE (PF) 0.9 % IJ SOLN
INTRAMUSCULAR | Status: AC
  Filled 2019-09-18: qty 50

## 2019-09-18 MED ORDER — MORPHINE SULFATE (PF) 4 MG/ML IV SOLN
4.0000 mg | Freq: Once | INTRAVENOUS | Status: AC
  Administered 2019-09-18: 4 mg via INTRAVENOUS
  Filled 2019-09-18: qty 1

## 2019-09-18 MED ORDER — SODIUM CHLORIDE 0.9 % IV BOLUS
1000.0000 mL | Freq: Once | INTRAVENOUS | Status: AC
  Administered 2019-09-18: 1000 mL via INTRAVENOUS

## 2019-09-18 NOTE — Discharge Instructions (Addendum)
Continue medications as previously prescribed.  Follow-up with your surgeon if you experience ongoing problems, and return to the ER if you develop worsening pain, high fever, bloody stool, or other new and concerning symptoms.

## 2019-10-21 ENCOUNTER — Encounter: Payer: Self-pay | Admitting: *Deleted

## 2019-10-21 ENCOUNTER — Other Ambulatory Visit: Payer: Self-pay

## 2019-10-21 DIAGNOSIS — Z48815 Encounter for surgical aftercare following surgery on the digestive system: Secondary | ICD-10-CM | POA: Insufficient documentation

## 2019-10-21 DIAGNOSIS — Z5321 Procedure and treatment not carried out due to patient leaving prior to being seen by health care provider: Secondary | ICD-10-CM | POA: Insufficient documentation

## 2019-10-21 DIAGNOSIS — Z8719 Personal history of other diseases of the digestive system: Secondary | ICD-10-CM | POA: Diagnosis not present

## 2019-10-21 LAB — CBC WITH DIFFERENTIAL/PLATELET
Abs Immature Granulocytes: 0.02 10*3/uL (ref 0.00–0.07)
Basophils Absolute: 0.1 10*3/uL (ref 0.0–0.1)
Basophils Relative: 1 %
Eosinophils Absolute: 0.1 10*3/uL (ref 0.0–0.5)
Eosinophils Relative: 1 %
HCT: 38.6 % (ref 36.0–46.0)
Hemoglobin: 12.7 g/dL (ref 12.0–15.0)
Immature Granulocytes: 0 %
Lymphocytes Relative: 41 %
Lymphs Abs: 2.8 10*3/uL (ref 0.7–4.0)
MCH: 29.8 pg (ref 26.0–34.0)
MCHC: 32.9 g/dL (ref 30.0–36.0)
MCV: 90.6 fL (ref 80.0–100.0)
Monocytes Absolute: 0.6 10*3/uL (ref 0.1–1.0)
Monocytes Relative: 8 %
Neutro Abs: 3.4 10*3/uL (ref 1.7–7.7)
Neutrophils Relative %: 49 %
Platelets: 254 10*3/uL (ref 150–400)
RBC: 4.26 MIL/uL (ref 3.87–5.11)
RDW: 12.6 % (ref 11.5–15.5)
WBC: 6.9 10*3/uL (ref 4.0–10.5)
nRBC: 0 % (ref 0.0–0.2)

## 2019-10-21 NOTE — ED Triage Notes (Signed)
Pt states that they were straining when they felt pain and felt "intestines come out".  Pt states that they have have been working out this evening and they were able to reduce this several times but it came back out several times.Pt has hx of umbilical hernia that was reduced when they were 8. Pt states that they have had several abdominal surgeries and they had a SBO in January.

## 2019-10-22 ENCOUNTER — Other Ambulatory Visit (HOSPITAL_COMMUNITY): Payer: Self-pay | Admitting: Infectious Diseases

## 2019-10-22 ENCOUNTER — Ambulatory Visit
Admission: RE | Admit: 2019-10-22 | Discharge: 2019-10-22 | Disposition: A | Discharge status: Home / Self Care | Payer: BC Managed Care – PPO | Source: Ambulatory Visit | Attending: Infectious Diseases | Admitting: Infectious Diseases

## 2019-10-22 ENCOUNTER — Other Ambulatory Visit: Payer: Self-pay | Admitting: Cardiovascular Disease

## 2019-10-22 ENCOUNTER — Emergency Department
Admission: EM | Admit: 2019-10-22 | Discharge: 2019-10-22 | Disposition: A | Discharge status: Home / Self Care | Payer: BC Managed Care – PPO | Attending: Emergency Medicine | Admitting: Emergency Medicine

## 2019-10-22 ENCOUNTER — Other Ambulatory Visit: Payer: Self-pay | Admitting: Infectious Diseases

## 2019-10-22 DIAGNOSIS — K429 Umbilical hernia without obstruction or gangrene: Secondary | ICD-10-CM

## 2019-10-22 DIAGNOSIS — R109 Unspecified abdominal pain: Secondary | ICD-10-CM

## 2019-10-22 LAB — COMPREHENSIVE METABOLIC PANEL
ALT: 18 U/L (ref 0–44)
AST: 17 U/L (ref 15–41)
Albumin: 4 g/dL (ref 3.5–5.0)
Alkaline Phosphatase: 66 U/L (ref 38–126)
Anion gap: 8 (ref 5–15)
BUN: 11 mg/dL (ref 6–20)
CO2: 27 mmol/L (ref 22–32)
Calcium: 8.3 mg/dL — ABNORMAL LOW (ref 8.9–10.3)
Chloride: 102 mmol/L (ref 98–111)
Creatinine, Ser: 0.72 mg/dL (ref 0.44–1.00)
GFR calc Af Amer: >60 mL/min (ref >60)
GFR calc non Af Amer: >60 mL/min (ref >60)
Glucose, Bld: 103 mg/dL — ABNORMAL HIGH (ref 70–99)
Potassium: 3.2 mmol/L — ABNORMAL LOW (ref 3.5–5.1)
Sodium: 137 mmol/L (ref 135–145)
Total Bilirubin: 0.6 mg/dL (ref 0.3–1.2)
Total Protein: 7.1 g/dL (ref 6.5–8.1)

## 2019-10-22 LAB — URINALYSIS, ROUTINE W REFLEX MICROSCOPIC
Bacteria, UA: NONE SEEN
Bilirubin Urine: NEGATIVE
Glucose, UA: NEGATIVE mg/dL
Ketones, ur: NEGATIVE mg/dL
Nitrite: NEGATIVE
Protein, ur: NEGATIVE mg/dL
Specific Gravity, Urine: 1.02 (ref 1.005–1.030)
pH: 5 (ref 5.0–8.0)

## 2019-10-22 LAB — LIPASE, BLOOD: Lipase: 34 U/L (ref 11–51)

## 2019-10-22 MED ORDER — IOHEXOL 300 MG/ML  SOLN
100.0000 mL | Freq: Once | INTRAMUSCULAR | Status: AC | PRN
  Administered 2019-10-22: 100 mL via INTRAVENOUS

## 2019-10-26 ENCOUNTER — Other Ambulatory Visit: Payer: Self-pay

## 2019-10-26 MED ORDER — ZENPEP 40000-126000 UNITS PO CPEP: ORAL_CAPSULE | ORAL | 1 refills | Status: DC

## 2019-10-26 NOTE — Telephone Encounter (Signed)
Last office visit 08/31/2019 Abdominal bloating  Last refill 08/18/2019 1 refills  Has appointment 11/09/2019

## 2019-11-09 ENCOUNTER — Encounter: Payer: Self-pay | Admitting: Gastroenterology

## 2019-11-09 ENCOUNTER — Telehealth (INDEPENDENT_AMBULATORY_CARE_PROVIDER_SITE_OTHER): Payer: BC Managed Care – PPO | Admitting: Gastroenterology

## 2019-11-09 DIAGNOSIS — K8689 Other specified diseases of pancreas: Secondary | ICD-10-CM

## 2019-11-09 MED ORDER — ZENPEP 40000-126000 UNITS PO CPEP: 3.0000 | ORAL_CAPSULE | Freq: Three times a day (TID) | ORAL | 5 refills | Status: AC

## 2019-11-09 NOTE — Progress Notes (Signed)
Sherri Sear, MD [address]  [place]  [place], [place] [address]  Main: [phone]  Fax: [phone]    Gastroenterology Consultation Tele Visit  Referring Provider:     Marinda Elk, MD Primary Care Physician:  Marinda Elk, MD Primary Gastroenterologist:  Dr. Cephas Darby Reason for Consultation:     Secondary pancreatic insufficiency        HPI:   [name] is a 39 y.o. adult referred by Dr. 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   I discussed the limitations, risks, security and privacy concerns of performing an evaluation and management service by telephone and the availability of in person appointments. I also discussed with the patient that there may be a patient responsible charge related to this service. The patient expressed understanding and agreed to proceed.  Location of the Patient: Home  Location of the provider: Office  Persons participating in the visit: Patient and provider only  History of Present Illness: [name] is a 39 year old male with history of duodenal switch, presented with abdominal bloating, nonbloody diarrhea, nausea.  He underwent upper endoscopy which was unremarkable.  Colonoscopy revealed tubular adenomas of colon.  I started him on pancreatic enzymes and his GI symptoms resolved.  He reports that his weight has been stable, in fact gained a couple pounds since he started taking testosterone.  He has small ventral hernia but asymptomatic.  He is pleased with resolution of his GI symptoms.  He has not tried amitriptyline yet as he has to take care of little children at home.    NSAIDs: None  Antiplts/Anticoagulants/Anti thrombotics: None  GI Procedures:  EGD and  colonoscopy 08/19/19 DIAGNOSIS:  A. STOMACH, RANDOM; COLD BIOPSY:  - GASTRIC OXYNTIC MUCOSA WITH NO SIGNIFICANT HISTOPATHOLOGIC CHANGE.  - NEGATIVE FOR H. PYLORI, DYSPLASIA, AND MALIGNANCY.   B. COLON POLYP, ASCENDING; COLD BIOPSY:  - TUBULAR ADENOMA.  - NEGATIVE FOR HIGH-GRADE DYSPLASIA AND MALIGNANCY.   C. COLON POLYP, TRANSVERSE; COLD SNARE:  - TUBULAR ADENOMA.  - NEGATIVE FOR HIGH-GRADE DYSPLASIA AND MALIGNANCY.    Past Medical History:  Diagnosis Date  . Asthma   . Breast cyst   . Breast mass   . Depression   . Fibrocystic breast   . Fibromyalgia   . Migraines     Past Surgical History:  Procedure Laterality Date  . BUNIONECTOMY Bilateral   . COLONOSCOPY WITH PROPOFOL N/A 08/19/2019   Procedure: COLONOSCOPY WITH PROPOFOL;  Surgeon: Lin Landsman, MD;  Location: [facility] ENDOSCOPY;  Service: Gastroenterology;  Laterality: N/A;  . ESOPHAGOGASTRODUODENOSCOPY (EGD) WITH PROPOFOL N/A 08/19/2019   Procedure: ESOPHAGOGASTRODUODENOSCOPY (EGD) WITH PROPOFOL;  Surgeon: Lin Landsman, MD;  Location: [facility] ENDOSCOPY;  Service: Gastroenterology;  Laterality: N/A;  . EYE SURGERY     blood clot in eye  . HERNIA REPAIR  1991, 2001   inguinal and umbilical  . LAPAROSCOPIC GASTRIC SLEEVE RESECTION  2015    Current Outpatient Medications:  .  albuterol (VENTOLIN HFA) 108 (90 Base) MCG/ACT inhaler, Inhale into the lungs., Disp: , Rfl:  .  B Complex-C (SUPER B COMPLEX/VITAMIN C PO), Take 1 tablet by mouth daily. , Disp: , Rfl:  .  calcium carbonate (OS-CAL - DOSED IN MG OF  ELEMENTAL CALCIUM) 1250 (500 Ca) MG tablet, Take 1 tablet by mouth 2 (two) times daily with a meal., Disp: , Rfl:  .  cholecalciferol (VITAMIN D) 1000 units tablet, Take 1,000 Units by mouth daily., Disp: , Rfl:  .  dronabinol (MARINOL) 5 MG capsule, Take 5 mg by mouth 2 (two) times daily as needed., Disp: , Rfl:  .  Multiple Vitamins-Minerals (MULTI COMPLETE/IRON PO), Take by mouth., Disp: , Rfl:  .  ondansetron  (ZOFRAN-ODT) 4 MG disintegrating tablet, , Disp: , Rfl:  .  Pancrelipase, Lip-Prot-Amyl, (ZENPEP) 40000-126000 units CPEP, Take 3 capsules by mouth 3 x daily with food. Take 3 Capsules by mouth with the first bite of each meals and 1 capsule with snacks, Disp: 240 capsule, Rfl: 5 .  Simethicone (GAS-X PO), Take by mouth., Disp: , Rfl:  .  zolmitriptan (ZOMIG-ZMT) 2.5 MG disintegrating tablet, , Disp: , Rfl:  .  BD DISP NEEDLES 25G X 5/8" MISC, SMARTSIG:Injection Once a Week, Disp: , Rfl:  .  testosterone cypionate (DEPOTESTOSTERONE CYPIONATE) 200 MG/ML injection, Inject into the muscle., Disp: , Rfl:  .  vitamin E 1000 UNIT capsule, Take by mouth., Disp: , Rfl:    Family History  Problem Relation Age of Onset  . Breast cancer Paternal Aunt   . Breast cancer Paternal Aunt   . Healthy Mother   . CVA Father   . Hypertension Father   . Diabetes Father   . Breast cancer Paternal Grandmother      Social History   Tobacco Use  . Smoking status: Former Smoker    Quit date: 06/21/2003    Years since quitting: 16.3  . Smokeless tobacco: Never Used  Substance Use Topics  . Alcohol use: No  . Drug use: No    Allergies as of 11/09/2019 - Review Complete 11/09/2019  Allergen Reaction Noted  . Penicillins Anaphylaxis 12/09/2013  . Chlorhexidine Hives 11/06/2016  . Chlorhexidine gluconate Hives 11/06/2016  . Clonazepam  02/17/2014  . Dexamethasone Hives 02/05/2015  . Doxycycline Nausea And Vomiting 12/09/2013  . Fluoxetine Other (See Comments) 02/17/2014  . Nsaids  05/14/2015  . Other  05/13/2015  . Hydrocodone-acetaminophen Hives and Rash 02/17/2014  . Tape Rash 02/17/2014  . Tuberculin ppd Rash 02/17/2014     Imaging Studies: Reviewed  Assessment and Plan:   [name] is a 39 y.o. adult with history of morbid obesity, history of gastric sleeve, followed by duodenal switch in 02/2019, small bowel obstruction s/p ex lap with resection in 03/2019, dilation and Botox injection of  pylorus referral visit for follow-up of nausea, abdominal bloating, diarrhea and epigastric pain.  EGD was unremarkable including biopsies.  He is empirically treated for possible bacterial overgrowth with 2 weeks course of Xifaxan.  His symptoms resolved with initiation of pancreatic enzymes, currently on Zenpep 40 K 3 capsules with each meal and 1 with snack.  Recommend to continue the same  Tubular adenomas of colon Recommend surveillance colonoscopy at age 48   Follow Up Instructions:   I discussed the assessment and treatment plan with the patient. The patient was provided an opportunity to ask questions and all were answered. The patient agreed with the plan and demonstrated an understanding of the instructions.   The patient was advised to call back or seek an in-person evaluation if the symptoms worsen or if the condition fails to improve as anticipated.  I provided 11 minutes of non-face-to-face time during this encounter.   Follow up in 6  months   Cephas Darby, MD

## 2020-04-05 DIAGNOSIS — Z789 Other specified health status: Secondary | ICD-10-CM | POA: Insufficient documentation

## 2020-04-05 HISTORY — DX: Other specified health status: Z78.9

## 2020-04-20 ENCOUNTER — Encounter (HOSPITAL_COMMUNITY): Payer: Self-pay | Admitting: Emergency Medicine

## 2020-04-20 ENCOUNTER — Emergency Department (HOSPITAL_COMMUNITY)
Admission: EM | Admit: 2020-04-20 | Discharge: 2020-04-20 | Disposition: A | Discharge status: Home / Self Care | Payer: BC Managed Care – PPO | Attending: Emergency Medicine | Admitting: Emergency Medicine

## 2020-04-20 DIAGNOSIS — Z5321 Procedure and treatment not carried out due to patient leaving prior to being seen by health care provider: Secondary | ICD-10-CM | POA: Diagnosis not present

## 2020-04-20 DIAGNOSIS — G9332 Myalgic encephalomyelitis/chronic fatigue syndrome: Secondary | ICD-10-CM

## 2020-04-20 DIAGNOSIS — R079 Chest pain, unspecified: Secondary | ICD-10-CM | POA: Insufficient documentation

## 2020-04-20 HISTORY — DX: Myalgic encephalomyelitis/chronic fatigue syndrome: G93.32

## 2020-04-20 LAB — BASIC METABOLIC PANEL
Anion gap: 9 (ref 5–15)
BUN: <5 mg/dL — ABNORMAL LOW (ref 6–20)
CO2: 27 mmol/L (ref 22–32)
Calcium: 8.6 mg/dL — ABNORMAL LOW (ref 8.9–10.3)
Chloride: 101 mmol/L (ref 98–111)
Creatinine, Ser: 0.93 mg/dL (ref 0.44–1.00)
GFR, Estimated: >60 mL/min (ref >60)
Glucose, Bld: 83 mg/dL (ref 70–99)
Potassium: 3.4 mmol/L — ABNORMAL LOW (ref 3.5–5.1)
Sodium: 137 mmol/L (ref 135–145)

## 2020-04-20 LAB — CBC
HCT: 43.7 % (ref 36.0–46.0)
Hemoglobin: 14.2 g/dL (ref 12.0–15.0)
MCH: 29.2 pg (ref 26.0–34.0)
MCHC: 32.5 g/dL (ref 30.0–36.0)
MCV: 89.9 fL (ref 80.0–100.0)
Platelets: 215 10*3/uL (ref 150–400)
RBC: 4.86 MIL/uL (ref 3.87–5.11)
RDW: 13.3 % (ref 11.5–15.5)
WBC: 4.4 10*3/uL (ref 4.0–10.5)
nRBC: 0 % (ref 0.0–0.2)

## 2020-04-20 LAB — TROPONIN I (HIGH SENSITIVITY): Troponin I (High Sensitivity): 4 ng/L (ref <18)

## 2020-04-20 NOTE — ED Notes (Signed)
Removed patient IV from EMS, patient leaving.

## 2020-04-20 NOTE — ED Notes (Signed)
Patient told this tech they were leaving, said they could not sit here and wait and could manage their pain at home. Patient's IV was removed and patient left.

## 2020-04-20 NOTE — ED Triage Notes (Signed)
Patient BIB [facility] for evaluation of chest pain that started suddenly today. Patient is waiting for result of COVID test after rapid test was negative. 20g saline lock in left forearm.

## 2020-05-03 ENCOUNTER — Other Ambulatory Visit
Admission: RE | Admit: 2020-05-03 | Discharge: 2020-05-03 | Disposition: A | Discharge status: Home / Self Care | Payer: BC Managed Care – PPO | Source: Ambulatory Visit | Attending: Physician Assistant | Admitting: Physician Assistant

## 2020-05-03 DIAGNOSIS — R000 Tachycardia, unspecified: Secondary | ICD-10-CM | POA: Diagnosis not present

## 2020-05-03 DIAGNOSIS — U071 COVID-19: Secondary | ICD-10-CM | POA: Diagnosis present

## 2020-05-03 LAB — D-DIMER, QUANTITATIVE: D-Dimer, Quant: <0.27 ug/mL-FEU (ref 0.00–0.50)

## 2020-05-24 ENCOUNTER — Other Ambulatory Visit: Payer: Self-pay | Admitting: Physician Assistant

## 2020-05-24 DIAGNOSIS — Z1231 Encounter for screening mammogram for malignant neoplasm of breast: Secondary | ICD-10-CM

## 2020-06-12 ENCOUNTER — Ambulatory Visit
Admission: RE | Admit: 2020-06-12 | Discharge: 2020-06-12 | Disposition: A | Discharge status: Home / Self Care | Payer: BC Managed Care – PPO | Source: Ambulatory Visit | Attending: Physician Assistant | Admitting: Physician Assistant

## 2020-06-12 ENCOUNTER — Other Ambulatory Visit: Payer: Self-pay

## 2020-06-12 DIAGNOSIS — Z1231 Encounter for screening mammogram for malignant neoplasm of breast: Secondary | ICD-10-CM | POA: Insufficient documentation

## 2020-06-13 ENCOUNTER — Other Ambulatory Visit: Payer: Self-pay | Admitting: Physician Assistant

## 2020-06-13 DIAGNOSIS — N6310 Unspecified lump in the right breast, unspecified quadrant: Secondary | ICD-10-CM

## 2020-06-14 ENCOUNTER — Ambulatory Visit
Admission: RE | Admit: 2020-06-14 | Discharge: 2020-06-14 | Disposition: A | Discharge status: Home / Self Care | Payer: BC Managed Care – PPO | Source: Ambulatory Visit | Attending: Physician Assistant | Admitting: Physician Assistant

## 2020-06-14 ENCOUNTER — Other Ambulatory Visit: Payer: Self-pay

## 2020-06-14 DIAGNOSIS — N6310 Unspecified lump in the right breast, unspecified quadrant: Secondary | ICD-10-CM | POA: Diagnosis not present

## 2020-07-12 DIAGNOSIS — Z09 Encounter for follow-up examination after completed treatment for conditions other than malignant neoplasm: Secondary | ICD-10-CM

## 2020-07-12 HISTORY — DX: Encounter for follow-up examination after completed treatment for conditions other than malignant neoplasm: Z09

## 2020-07-16 ENCOUNTER — Emergency Department (HOSPITAL_COMMUNITY)
Admission: EM | Admit: 2020-07-16 | Discharge: 2020-07-16 | Disposition: A | Discharge status: Home / Self Care | Payer: BC Managed Care – PPO | Attending: Emergency Medicine | Admitting: Emergency Medicine

## 2020-07-16 ENCOUNTER — Encounter (HOSPITAL_COMMUNITY): Payer: Self-pay | Admitting: *Deleted

## 2020-07-16 DIAGNOSIS — Z7951 Long term (current) use of inhaled steroids: Secondary | ICD-10-CM | POA: Diagnosis not present

## 2020-07-16 DIAGNOSIS — Z87891 Personal history of nicotine dependence: Secondary | ICD-10-CM | POA: Diagnosis not present

## 2020-07-16 DIAGNOSIS — J029 Acute pharyngitis, unspecified: Secondary | ICD-10-CM | POA: Insufficient documentation

## 2020-07-16 DIAGNOSIS — Z20822 Contact with and (suspected) exposure to covid-19: Secondary | ICD-10-CM | POA: Insufficient documentation

## 2020-07-16 DIAGNOSIS — J45909 Unspecified asthma, uncomplicated: Secondary | ICD-10-CM | POA: Insufficient documentation

## 2020-07-16 LAB — GROUP A STREP BY PCR: Group A Strep by PCR: NOT DETECTED

## 2020-07-16 LAB — SARS CORONAVIRUS 2 (TAT 6-24 HRS): SARS Coronavirus 2: NEGATIVE

## 2020-07-16 NOTE — ED Notes (Signed)
Report received. Pt resting in room with family at bedside. NAD noted. Pt denies any needs at this time. Pt a/o x 4. Call light within reach. Will cont to mont.

## 2020-07-16 NOTE — ED Notes (Signed)
Dc instructions provided to patient, voiced understanding. NAD noted. VSS. Pt A/O x 4. Ambulatory without diff noted.

## 2020-07-16 NOTE — Discharge Instructions (Addendum)
Gonorrhea chlamydia screen is still pending.  We will call you with positive result or you can check my chart.  The Covid test is pending at time of discharge.  Instructions on how to follow this up on my chart are on your discharge paperwork, you can also call the department if you are having trouble finding these results.  If he/she is Covid positive he/she will need to be quarantine for total 5 days since the onset of symptoms +24 hours of no fever and resolving symptoms, additionally he/she needs to wear a mask near all others for 5 more days. If he/she is not Covid positive he/she is able to go back to normal day-to-day routine as long as he/she is not having fevers and it has been 24 hours since his/her last fever.

## 2020-07-16 NOTE — ED Provider Notes (Signed)
[place] EMERGENCY DEPARTMENT Provider Note   CSN: [id number] Arrival date & time: 07/16/20  1756     History Chief Complaint  Patient presents with  . Sore Throat    [name] is a 40 y.o. adult.  Patient comes in with daughter, similar symptoms of sore throat.  No fever, tolerating p.o.  Sexually active and does participate in oral sex, no swelling of the throat no adenopathy.  No vomiting.  No cough.        Past Medical History:  Diagnosis Date  . Asthma   . Breast cyst   . Breast mass   . Depression   . Fibrocystic breast   . Fibromyalgia   . Migraines     Patient Active Problem List   Diagnosis Date Noted  . 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/2018  . Dislocation of patellofemoral joint 10/13/2017  . Labor and delivery indication for care or intervention 02/15/2017  . Pregnancy 01/01/2017  . Encounter for procreative genetic counseling 08/02/2016  . Umbilical hernia without obstruction and without gangrene   . Norovirus 06/24/2016  . ADHD (attention deficit hyperactivity disorder) 06/20/2016  . Anxiety 06/20/2016  . Asthma without status asthmaticus 06/20/2016  . Depression 06/20/2016  . Fibrocystic breast disease 06/20/2016  . Fibromyalgia 06/20/2016  . Migraine 06/20/2016  . Ovarian cyst 06/20/2016  . Morbid obesity ([place]) 02/24/2014  . Headache 07/26/2013  . KNEE PAIN, BILATERAL 01/02/2010  . TENDINITIS, PATELLAR 01/02/2010    Past Surgical History:  Procedure Laterality Date  . BUNIONECTOMY Bilateral   . COLONOSCOPY WITH PROPOFOL N/A 08/19/2019   Procedure: COLONOSCOPY WITH PROPOFOL;  Surgeon: Lin Landsman, MD;  Location: [facility] ENDOSCOPY;  Service: Gastroenterology;  Laterality: N/A;  . ESOPHAGOGASTRODUODENOSCOPY (EGD) WITH PROPOFOL N/A 08/19/2019   Procedure: ESOPHAGOGASTRODUODENOSCOPY (EGD) WITH PROPOFOL;  Surgeon: Lin Landsman,  MD;  Location: [facility] ENDOSCOPY;  Service: Gastroenterology;  Laterality: N/A;  . EYE SURGERY     blood clot in eye  . HERNIA REPAIR  1991, 2001   inguinal and umbilical  . LAPAROSCOPIC GASTRIC SLEEVE RESECTION  2015     OB History    Gravida  2   Para      Term      Preterm      AB      Living  0     SAB  0   IAB      Ectopic      Multiple      Live Births              Family History  Problem Relation Age of Onset  . Breast cancer Paternal Aunt   . Breast cancer Paternal Aunt   . Healthy Mother   . CVA Father   . Hypertension Father   . Diabetes Father   . Breast cancer Paternal Grandmother     Social History   Tobacco Use  . Smoking status: Former Smoker    Quit date: 06/21/2003    Years since quitting: 17.0  . Smokeless tobacco: Never Used  Substance Use Topics  . Alcohol use: No  . Drug use: No    Home Medications Prior to Admission medications   Medication Sig Start Date End Date Taking? Authorizing Provider  albuterol (VENTOLIN HFA) 108 (90 Base) MCG/ACT inhaler Inhale into the lungs.    [provider]  B Complex-C (SUPER B COMPLEX/VITAMIN C PO)  Take 1 tablet by mouth daily.     [provider]  BD DISP NEEDLES 25G X 5/8" [place] Once a Week 09/29/19   [provider]  calcium carbonate (OS-CAL - DOSED IN MG OF ELEMENTAL CALCIUM) 1250 (500 Ca) MG tablet Take 1 tablet by mouth 2 (two) times daily with a meal.    [provider]  cholecalciferol (VITAMIN D) 1000 units tablet Take 1,000 Units by mouth daily.    [provider]  dronabinol (MARINOL) 5 MG capsule Take 5 mg by mouth 2 (two) times daily as needed. 06/24/19   [provider]  Multiple Vitamins-Minerals (MULTI COMPLETE/IRON PO) Take by mouth.    [provider]  ondansetron (ZOFRAN-ODT) 4 MG disintegrating tablet  04/10/19   [provider]  Simethicone (GAS-X PO) Take by mouth.    [provider]  testosterone cypionate (DEPOTESTOSTERONE CYPIONATE) 200 MG/ML injection Inject into the muscle. 09/29/19   [provider]  vitamin E 1000 UNIT capsule Take by mouth.    [provider]  zolmitriptan (ZOMIG-ZMT) 2.5 MG disintegrating tablet  03/24/19   [provider]    Allergies    Penicillins, Chlorhexidine, Chlorhexidine gluconate, Clonazepam, Dexamethasone, Doxycycline, Fluoxetine, Nsaids, Other, Hydrocodone-acetaminophen, Tape, and Tuberculin ppd  Review of Systems   Review of Systems  Constitutional: Negative for chills and fever.  HENT: Positive for sore throat. Negative for congestion and rhinorrhea.   Respiratory: Negative for cough and shortness of breath.   Cardiovascular: Negative for chest pain and palpitations.  Gastrointestinal: Negative for diarrhea, nausea and vomiting.  Genitourinary: Negative for difficulty urinating and dysuria.  Musculoskeletal: Negative for arthralgias and back pain.  Skin: Negative for color change and rash.  Neurological: Negative for light-headedness and headaches.    Physical Exam Updated Vital Signs BP 122/73   Pulse 86   Temp 98.5 F (36.9 C) (Oral)   Resp 20   Wt 75.8 kg   SpO2 100%   BMI 23.96 kg/m   Physical Exam Vitals and nursing note reviewed. Exam conducted with a chaperone present.  Constitutional:      General: He is not in acute distress.    Appearance: Normal appearance.  HENT:     Head: Normocephalic and atraumatic.     Nose: No rhinorrhea.     Mouth/Throat:     Pharynx: Posterior oropharyngeal erythema present. No oropharyngeal exudate or uvula swelling.     Tonsils: No tonsillar exudate or tonsillar abscesses. 2+ on the right. 2+ on the left.  Eyes:     General:        Right eye: No discharge.        Left eye: No discharge.     Conjunctiva/sclera: Conjunctivae normal.  Cardiovascular:     Rate and Rhythm: Normal rate and regular rhythm.  Pulmonary:     Effort:  Pulmonary effort is normal. No respiratory distress.     Breath sounds: No stridor.  Abdominal:     General: Abdomen is flat. There is no distension.     Palpations: Abdomen is soft.  Musculoskeletal:        General: No tenderness or signs of injury.  Skin:    General: Skin is warm and dry.     Capillary Refill: Capillary refill takes less than 2 seconds.  Neurological:     General: No focal deficit present.     Mental Status: He is alert. Mental status is at baseline.     Motor:  No weakness.  Psychiatric:        Mood and Affect: Mood normal.        Behavior: Behavior normal.     ED Results / Procedures / Treatments   Labs (all labs ordered are listed, but only abnormal results are displayed) Labs Reviewed  GROUP A STREP BY PCR  SARS CORONAVIRUS 2 (TAT 6-24 HRS)  GC/CHLAMYDIA PROBE AMP ([place]) NOT AT [facility]    EKG None  Radiology No results found.  Procedures Procedures   Medications Ordered in ED Medications - No data to display  ED Course  I have reviewed the triage vital signs and the nursing notes.  Pertinent labs & imaging results that were available during my care of the patient were reviewed by me and considered in my medical decision making (see chart for details).    MDM Rules/Calculators/A&P                          Well-appearing, pharyngitis, will evaluate for viral versus bacterial.  Gonorrhea chlamydia will be sent and pending.  Strep screen negative.  Patient tolerating p.o. well-appearing.  Viral panel and GC swab pending.  Return precautions outpatient follow-up recommended. Final Clinical Impression(s) / ED Diagnoses Final diagnoses:  Pharyngitis, unspecified etiology    Rx / DC Orders ED Discharge Orders    None       Breck Coons, MD 07/16/20 Joen [place]

## 2020-07-16 NOTE — ED Triage Notes (Signed)
Pt with throat burning since last night.  No fevers.  Pt has been coughing some.

## 2020-07-18 LAB — GC/CHLAMYDIA PROBE AMP (~~LOC~~) NOT AT ARMC
Chlamydia: NEGATIVE
Comment: NEGATIVE
Comment: NORMAL
Neisseria Gonorrhea: NEGATIVE

## 2020-08-23 DIAGNOSIS — F649 Gender identity disorder, unspecified: Secondary | ICD-10-CM | POA: Insufficient documentation

## 2020-08-23 HISTORY — DX: Gender identity disorder, unspecified: F64.9

## 2020-09-18 ENCOUNTER — Encounter: Payer: BC Managed Care – PPO | Admitting: Speech Pathology

## 2020-09-20 ENCOUNTER — Other Ambulatory Visit: Payer: Self-pay

## 2020-09-20 ENCOUNTER — Ambulatory Visit: Payer: BC Managed Care – PPO | Attending: Physician Assistant | Admitting: Speech Pathology

## 2020-09-20 DIAGNOSIS — F603 Borderline personality disorder: Secondary | ICD-10-CM

## 2020-09-20 DIAGNOSIS — U099 Post covid-19 condition, unspecified: Secondary | ICD-10-CM | POA: Diagnosis present

## 2020-09-20 DIAGNOSIS — R41841 Cognitive communication deficit: Secondary | ICD-10-CM | POA: Diagnosis not present

## 2020-09-20 DIAGNOSIS — F419 Anxiety disorder, unspecified: Secondary | ICD-10-CM | POA: Insufficient documentation

## 2020-09-21 DIAGNOSIS — F603 Borderline personality disorder: Secondary | ICD-10-CM | POA: Insufficient documentation

## 2020-09-22 NOTE — Therapy (Addendum)
[place] MAIN [facility] SERVICES [address] [place], [place], [address] Phone: [phone]   Fax:  [phone]  Speech Language Pathology Evaluation  Patient Details  Name: [name] MRN: [id number] Date of Birth: 06/23/1980 Referring Provider (SLP): Delman Cheadle   Encounter Date: 09/20/2020   End of Session - 09/22/20 0655     Visit Number 1    Number of Visits 10   Date for SLP Re-Evaluation 12/15/20    Authorization Type [employer]    Authorization Time Period 09/20/2020 thru 12/15/2020    Authorization - Visit Number 1    Progress Note Due on Visit 10    SLP Start Time 1315    SLP Stop Time  1400    SLP Time Calculation (min) 45 min    Activity Tolerance Patient tolerated treatment well             Past Medical History:  Diagnosis Date   Asthma    Breast cyst    Breast mass    Depression    Fibrocystic breast    Fibromyalgia    Migraines     Past Surgical History:  Procedure Laterality Date   BUNIONECTOMY Bilateral    COLONOSCOPY WITH PROPOFOL N/A 08/19/2019   Procedure: COLONOSCOPY WITH PROPOFOL;  Surgeon: Lin Landsman, MD;  Location: [facility] ENDOSCOPY;  Service: Gastroenterology;  Laterality: N/A;   ESOPHAGOGASTRODUODENOSCOPY (EGD) WITH PROPOFOL N/A 08/19/2019   Procedure: ESOPHAGOGASTRODUODENOSCOPY (EGD) WITH PROPOFOL;  Surgeon: Lin Landsman, MD;  Location: [facility] ENDOSCOPY;  Service: Gastroenterology;  Laterality: N/A;   EYE SURGERY     blood clot in eye   HERNIA REPAIR  1991, 2001   inguinal and umbilical   LAPAROSCOPIC GASTRIC SLEEVE RESECTION  2015    There were no vitals filed for this visit.   Subjective Assessment - 09/22/20 0650     Subjective pt pleasant, engaged, good historian    Currently in Pain? No/denies                SLP Evaluation [facility] - 09/22/20 0092       SLP Visit Information   SLP Received On 09/20/20    Referring Provider (SLP) Delman Cheadle    Onset Date  04/21/2020    Medical Diagnosis Cognitive Communication Deficits related to COVID Long Hauler      General Information   HPI    Behavioral/Cognition appropriate    Mobility Status abmulatory      Balance Screen   Has the patient fallen in the past 6 months No    Has the patient had a decrease in activity level because of a fear of falling?  No    Is the patient reluctant to leave their home because of a fear of falling?  No      Prior Functional Status   Cognitive/Linguistic Baseline Within functional limits    Type of Home House     Lives With Spouse    Available Support Family    Vocation Full time employment      Cognition   Overall Cognitive Status Impaired/Different from baseline    Area of Impairment Memory;Problem solving    Memory Decreased short-term memory    Attention Selective    Selective Attention --   dx of ADHD, improved with medication   Memory Impaired    Memory Impairment --   d/t anxiety   Awareness Appears intact    Problem Solving Appears intact  Behaviors Restless      Auditory Comprehension   Overall Auditory Comprehension Appears within functional limits for tasks assessed      Visual Recognition/Discrimination   Discrimination Within Function Limits      Reading Comprehension   Reading Status Within funtional limits      Expression   Primary Mode of Expression Verbal      Verbal Expression   Overall Verbal Expression Appears within functional limits for tasks assessed      Written Expression   Dominant Hand Right    Written Expression Within Functional Limits      Oral Motor/Sensory Function   Overall Oral Motor/Sensory Function Appears within functional limits for tasks assessed      Motor Speech   Overall Motor Speech Appears within functional limits for tasks assessed      Standardized Assessments   Standardized Assessments  Cognitive Linguistic Quick Test                             SLP Education -  09/22/20 0655     Education Details results of assessment, ST POC    Person(s) Educated Patient    Methods Explanation    Comprehension Verbalized understanding              SLP Short Term Goals - 09/22/20 0700       SLP SHORT TERM GOAL #1   Title With minimal assistance, pt will utilize external memory aids to recall information with > 80% accuracy.    Baseline moderate    Time 10    Period --   sessions   Status New      SLP SHORT TERM GOAL #2   Title With minimal assistance, pt will learn and use internal memory aids to recall information with > 80% accuracy.    Baseline moderate    Time 10    Period --   sessions   Status New              SLP Long Term Goals - 09/22/20 4782       SLP LONG TERM GOAL #1   Title Patient will identify cognitive-communication barriers and participate in developing functional compensatory strategies.    Baseline new goal    Time 12    Period Weeks    Status New    Target Date 12/15/20              Plan - 09/22/20 0657     Clinical Impression Statement    Speech Therapy Frequency 2x / week    Duration 12 weeks    Treatment/Interventions Functional tasks;Patient/family education;SLP instruction and feedback    Potential to Achieve Goals Good    Potential Considerations Other (comment)   Anxiety/ADHD   Consulted and Agree with Plan of Care Patient             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    Problem List Patient Active Problem List   Diagnosis Date Noted   Borderline personality disorder ([place]) 09/21/2020   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/2018   Dislocation of patellofemoral joint 10/13/2017   Labor and delivery indication for care or intervention  02/15/2017   Pregnancy 01/01/2017   Encounter for  procreative genetic counseling 02/40/9735   Umbilical hernia without obstruction and without gangrene    Norovirus 06/24/2016   ADHD (attention deficit hyperactivity disorder) 06/20/2016   Anxiety 06/20/2016   Asthma without status asthmaticus 06/20/2016   Depression 06/20/2016   Fibrocystic breast disease 06/20/2016   Fibromyalgia 06/20/2016   Migraine 06/20/2016   Ovarian cyst 06/20/2016   Morbid obesity ([place]) 02/24/2014   Headache 07/26/2013   KNEE PAIN, BILATERAL 01/02/2010   TENDINITIS, PATELLAR 01/02/2010   Elise Knobloch B. Rutherford Nail M.S., CCC-SLP, [place] Pathologist Rehabilitation Services Office [phone]  Stormy Fabian 09/22/2020, 7:05 AM  [place] MAIN [facility] SERVICES [address] [place], [place], [address] Phone: [phone]   Fax:  [phone]  Name: [name] MRN: [id number] Date of Birth: 1980/07/17

## 2020-09-24 NOTE — Addendum Note (Signed)
Addended by: Stormy Fabian B on: 09/24/2020 04:40 PM   Modules accepted: Orders

## 2020-09-25 ENCOUNTER — Ambulatory Visit: Payer: BC Managed Care – PPO | Admitting: Speech Pathology

## 2020-09-25 ENCOUNTER — Other Ambulatory Visit: Payer: Self-pay

## 2020-09-25 DIAGNOSIS — R41841 Cognitive communication deficit: Secondary | ICD-10-CM | POA: Diagnosis not present

## 2020-09-25 DIAGNOSIS — U099 Post covid-19 condition, unspecified: Secondary | ICD-10-CM

## 2020-09-25 NOTE — Therapy (Signed)
[place] MAIN [facility] SERVICES [address] [place], [place], [address] Phone: [phone]   Fax:  [phone]  Speech Language Pathology Treatment DISCHARGE SUMMARY  Patient Details  Name: [name] MRN: [id number] Date of Birth: 17-Jul-1980 Referring Provider (SLP): Delman Cheadle   Encounter Date: 09/25/2020   End of Session - 09/25/20 1458     Visit Number 2    Authorization Type [employer]    Authorization - Visit Number 2    Progress Note Due on Visit 10    SLP Start Time 1415    SLP Stop Time  1445    SLP Time Calculation (min) 30 min    Activity Tolerance Patient tolerated treatment well             Past Medical History:  Diagnosis Date   Asthma    Breast cyst    Breast mass    Depression    Fibrocystic breast    Fibromyalgia    Migraines     Past Surgical History:  Procedure Laterality Date   BUNIONECTOMY Bilateral    COLONOSCOPY WITH PROPOFOL N/A 08/19/2019   Procedure: COLONOSCOPY WITH PROPOFOL;  Surgeon: Lin Landsman, MD;  Location: [facility] ENDOSCOPY;  Service: Gastroenterology;  Laterality: N/A;   ESOPHAGOGASTRODUODENOSCOPY (EGD) WITH PROPOFOL N/A 08/19/2019   Procedure: ESOPHAGOGASTRODUODENOSCOPY (EGD) WITH PROPOFOL;  Surgeon: Lin Landsman, MD;  Location: [facility] ENDOSCOPY;  Service: Gastroenterology;  Laterality: N/A;   EYE SURGERY     blood clot in eye   HERNIA REPAIR  1991, 2001   inguinal and umbilical   LAPAROSCOPIC GASTRIC SLEEVE RESECTION  2015    There were no vitals filed for this visit.   Subjective Assessment - 09/25/20 1447     Subjective "I am doing so so"    Currently in Pain? No/denies                   ADULT SLP TREATMENT - 09/25/20 0001       Treatment Provided   Treatment provided Cognitive-Linquistic      Cognitive-Linquistic Treatment   Treatment focused on Cognition;Patient/family/caregiver education    Skilled Treatment Education provided on  strategies to aid in attending to activities, memory strategies shared as well              SLP Education - 09/25/20 1451     Education Details Formal results of the Cognitive Linguistic Quick Test, discharge packet provided with general strategies that can be helpful within activities of daily living    Person(s) Educated Patient    Methods Explanation;Demonstration;Handout    Comprehension Verbalized understanding              SLP Short Term Goals - 09/25/20 1459       SLP SHORT TERM GOAL #1   Title With minimal assistance, pt will utilize external memory aids to recall information with > 80% accuracy.    Status Achieved      SLP SHORT TERM GOAL #2   Title With minimal assistance, pt will learn and use internal memory aids to recall information with > 80% accuracy.    Status Achieved              SLP Long Term Goals - 09/25/20 1459       SLP LONG TERM GOAL #1   Title Patient will identify cognitive-communication barriers and participate in developing functional compensatory strategies.    Status Achieved  Plan - 09/25/20 1500     Clinical Impression Statement Pt presents with cognitive abilities that are solidly within the normal range. He further reports that he continues to use compensatory strategies to keep his attention to task. At this time, it appears that pt's functional symptoms appear related to increased anxiety, panic, fear and task fatigue. These deficits are best served by other professionals such as neuropsych and counseling.             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   Luetscher's  syndrome 02/19/2019   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/2018   Dislocation of patellofemoral joint 10/13/2017   Labor and delivery indication for care or intervention 02/15/2017   Pregnancy 01/01/2017   Encounter for procreative genetic counseling 46/80/3212   Umbilical hernia without obstruction and without gangrene    Norovirus 06/24/2016   ADHD (attention deficit hyperactivity disorder) 06/20/2016   Anxiety 06/20/2016   Asthma without status asthmaticus 06/20/2016   Depression 06/20/2016   Fibrocystic breast disease 06/20/2016   Fibromyalgia 06/20/2016   Migraine 06/20/2016   Ovarian cyst 06/20/2016   Morbid obesity ([place]) 02/24/2014   Headache 07/26/2013   KNEE PAIN, BILATERAL 01/02/2010   TENDINITIS, PATELLAR 01/02/2010   Embrie Mikkelsen B. Rutherford Nail M.S., CCC-SLP, [facility] Speech-Language Pathologist Rehabilitation Services Office [phone]  Stormy Fabian 09/25/2020, [place] MAIN [facility] SERVICES [address] [place], [place], [address] Phone: [phone]   Fax:  [phone]   Name: [name] MRN: [id number] Date of Birth: [birth year]

## 2020-09-29 ENCOUNTER — Ambulatory Visit: Payer: BC Managed Care – PPO | Admitting: Speech Pathology

## 2020-10-03 ENCOUNTER — Ambulatory Visit: Payer: BC Managed Care – PPO | Admitting: Speech Pathology

## 2020-10-05 ENCOUNTER — Encounter: Payer: BC Managed Care – PPO | Admitting: Speech Pathology

## 2020-10-09 ENCOUNTER — Encounter: Payer: BC Managed Care – PPO | Admitting: Speech Pathology

## 2020-10-13 ENCOUNTER — Encounter: Payer: BC Managed Care – PPO | Admitting: Speech Pathology

## 2020-10-16 ENCOUNTER — Encounter: Payer: BC Managed Care – PPO | Admitting: Speech Pathology

## 2020-10-18 ENCOUNTER — Other Ambulatory Visit: Payer: Self-pay

## 2020-10-18 ENCOUNTER — Encounter: Payer: Self-pay | Admitting: Occupational Therapy

## 2020-10-18 ENCOUNTER — Ambulatory Visit: Payer: BC Managed Care – PPO | Attending: Family Medicine | Admitting: Occupational Therapy

## 2020-10-18 DIAGNOSIS — R278 Other lack of coordination: Secondary | ICD-10-CM | POA: Insufficient documentation

## 2020-10-18 DIAGNOSIS — F419 Anxiety disorder, unspecified: Secondary | ICD-10-CM | POA: Insufficient documentation

## 2020-10-18 DIAGNOSIS — R41841 Cognitive communication deficit: Secondary | ICD-10-CM | POA: Insufficient documentation

## 2020-10-18 DIAGNOSIS — M6281 Muscle weakness (generalized): Secondary | ICD-10-CM | POA: Diagnosis not present

## 2020-10-18 DIAGNOSIS — U099 Post covid-19 condition, unspecified: Secondary | ICD-10-CM | POA: Diagnosis present

## 2020-10-18 NOTE — Therapy (Signed)
[place] MAIN [facility] SERVICES [address] [place], [place], [address] Phone: [phone]   Fax:  [phone]  Occupational Therapy Evaluation  Patient Details  Name: [name] MRN: [id number] Date of Birth: 28-Apr-1980 Referring Provider (OT): Dr. Brigitte Pulse   Encounter Date: 10/18/2020   OT End of Session - 10/18/20 1532     Visit Number 1    Number of Visits 12    Date for OT Re-Evaluation 01/10/21    Authorization Time Period Progress report period starting 10/18/2020    OT Start Time 1305    OT Stop Time 1400    OT Time Calculation (min) 55 min    Activity Tolerance Patient tolerated treatment well    Behavior During Therapy [facility] for tasks assessed/performed             Past Medical History:  Diagnosis Date   Asthma    Breast cyst    Breast mass    Depression    Fibrocystic breast    Fibromyalgia    Migraines     Past Surgical History:  Procedure Laterality Date   BUNIONECTOMY Bilateral    COLONOSCOPY WITH PROPOFOL N/A 08/19/2019   Procedure: COLONOSCOPY WITH PROPOFOL;  Surgeon: Lin Landsman, MD;  Location: [facility] ENDOSCOPY;  Service: Gastroenterology;  Laterality: N/A;   ESOPHAGOGASTRODUODENOSCOPY (EGD) WITH PROPOFOL N/A 08/19/2019   Procedure: ESOPHAGOGASTRODUODENOSCOPY (EGD) WITH PROPOFOL;  Surgeon: Lin Landsman, MD;  Location: [facility] ENDOSCOPY;  Service: Gastroenterology;  Laterality: N/A;   EYE SURGERY     blood clot in eye   HERNIA REPAIR  1991, 2001   inguinal and umbilical   LAPAROSCOPIC GASTRIC SLEEVE RESECTION  2015    There were no vitals filed for this visit.   Subjective Assessment - 10/18/20 1655     Subjective  Pt. Reports fatiguing very quickly.               [facility] OT Assessment - 10/18/20 0001       Assessment   Medical Diagnosis Long COVID    Referring Provider (OT) Dr. Brigitte Pulse    Onset Date/Surgical Date 04/22/19    Hand Dominance Right      Precautions   Precaution Comments  Heavy lifting      Restrictions   Weight Bearing Restrictions No      Balance Screen   Has the patient fallen in the past 6 months No    Has the patient had a decrease in activity level because of a fear of falling?  No    Is the patient reluctant to leave their home because of a fear of falling?  No      Home  Environment   Family/patient expects to be discharged to: Private residence    Type of [place] Two level    Alternate Level Stairs - Number of Steps 2 steps    Bathroom Shower/Tub Tub only    Hospital doctor None    Lives With Spouse;Family      Prior Function   Level of Independence Independent    Vocation Full time employment    Leisure Motorcycle, drums, listen to music, working with his hands, fixing things      ADL   Eating/Feeding Independent    Grooming Independent   increased time   Upper Body Bathing Independent    Lower Body Bathing Increased time  Upper Body Dressing Increased time    Lower Body Dressing Increased time    Toilet Transfer Independent    Toileting -  Product/process development scientist Independent      IADL   Prior Level of Function Shopping indepndent    Shopping Needs to be accompanied on any shopping trip    Prior Level of Function Light Housekeeping independent    Light Housekeeping --   Increased fatigue   Prior Level of Function Meal Prep Independent    Meal Prep Needs to have meals prepared and served    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    Prior Level of Function Medication Managment independent    Medication Management Has difficulty remembering to take medication    Prior Level of Function Financial Management independent    Financial Management Requires assistance      Written Expression   Dominant Hand Right    Handwriting 100% legible      Vision - History   Baseline Vision Other  (comment)   contacts     Activity Tolerance   Activity Tolerance Tolerates 30 min activity with multiple rests      Cognition   Overall Cognitive Status Impaired/Different from baseline      Observation/Other Assessments   Focus on Therapeutic Outcomes (FOTO)  82      Sensation   Light Touch Appears Intact    Proprioception Appears Intact      Coordination   Gross Motor Movements are Fluid and Coordinated Yes    Fine Motor Movements are Fluid and Coordinated Yes    Right 9 Hole Peg Test 21    Left 9 Hole Peg Test 23      AROM   Overall AROM Comments WFL      Strength   Overall Strength Within functional limits for tasks performed      Hand Function   Right Hand Grip (lbs) 90    Right Hand Lateral Pinch 20 lbs    Right Hand 3 Point Pinch 25 lbs    Left Hand Grip (lbs) 75    Left Hand Lateral Pinch 13 lbs    Left 3 point pinch 22 lbs                             OT Education - 10/18/20 1532     Education Details OT services, treatment plan, goals.    Person(s) Educated Patient    Methods Explanation    Comprehension Verbalized understanding              OT Short Term Goals - 10/18/20 1554       OT SHORT TERM GOAL #1   Title Pt. will independently demonstrate cognitive compensatory strategies for simulated medication management tasks.    Baseline Eval: Pt. has difficulty with managing medications    Time 4    Period Weeks    Status New    Target Date 10/18/20      OT SHORT TERM GOAL #2   Title --    Period --    Status --               OT Long Term Goals - 10/18/20 1706       OT LONG TERM GOAL #1   Title Pt. will improve activity tolerance to be able to independently, and efficiently complete  ADLs, and IADL tasks    Baseline Eval: pt. presents with limited activity tolerance.    Time 8    Period Weeks    Status New    Target Date 12/13/20      OT LONG TERM GOAL #2   Title Pt. will improve FOTO score by 2 points for  clinically significant improvements.    Baseline Eval: FOTO : 82    Time 8    Period Weeks    Status New    Target Date 12/13/20      OT LONG TERM GOAL #3   Title Pt. will perform multiple step light meal preparation independently, and safely.    Baseline Eval: Pt. has difficulty with light meal preparation.    Time 8    Period Weeks    Status New    Target Date 12/13/20                   Plan - 10/18/20 1535     Clinical Impression Statement Pt. is a 40 year old adult who was diagnosed with COVID-19  on April 22, 2019. FOTO score 82 with a Typical Result score of 79. Pt. presents with limited activity tolerance during daily ADL, and IADL tasks, difficulty with sequencing steps during ADL, and IADL tasks, difficulty managing medications, and difficulty cooking and performing meal preparation. Pt. does endorse one episode of forgetting to turn the stove off while leaving spaghetti in a frying pan on the hot stove. Pt. reports requiring vebal reminders for appointments, and requires cues from an alarm on the cell phone for medication reminders. Pt. will benefit from OT services to improve activity tolerance during ADL, and IADLs, and to provide pt. education about cognitve compensatory strategies to during ADLs, and IADL tasks.    OT Occupational Profile and History Problem Focused Assessment - Including review of records relating to presenting problem    Occupational performance deficits (Please refer to evaluation for details): ADL's;IADL's    Body Structure / Function / Physical Skills ADL;FMC;IADL    Rehab Potential Good    Clinical Decision Making Several treatment options, min-mod task modification necessary    Comorbidities Affecting Occupational Performance: May have comorbidities impacting occupational performance    Modification or Assistance to Complete Evaluation  Min-Moderate modification of tasks or assist with assess necessary to complete eval    OT Frequency 1x /  week    OT Duration 8 weeks    OT Treatment/Interventions Self-care/ADL training;Therapeutic exercise;DME and/or AE instruction;Patient/family education;Therapeutic activities    Consulted and Agree with Plan of Care Patient             Patient will benefit from skilled therapeutic intervention in order to improve the following deficits and impairments:   Body Structure / Function / Physical Skills: ADL, [place], IADL       Visit Diagnosis: Muscle weakness (generalized)    Problem List Patient Active Problem List   Diagnosis Date Noted   Borderline personality disorder ([place]) 09/21/2020   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/2018   Dislocation of patellofemoral joint 10/13/2017   Labor and delivery indication for care or intervention 02/15/2017   Pregnancy 01/01/2017   Encounter for procreative genetic counseling 99991111   Umbilical hernia without obstruction and without gangrene    Norovirus 06/24/2016   ADHD (attention deficit hyperactivity disorder) 06/20/2016   Anxiety 06/20/2016   Asthma without status asthmaticus 06/20/2016  Depression 06/20/2016   Fibrocystic breast disease 06/20/2016   Fibromyalgia 06/20/2016   Migraine 06/20/2016   Ovarian cyst 06/20/2016   Morbid obesity ([place]) 02/24/2014   Headache 07/26/2013   KNEE PAIN, BILATERAL 01/02/2010   TENDINITIS, PATELLAR 01/02/2010    Harrel Carina, MS, OTR/L 10/18/2020, 5:18 PM  [place] MAIN [facility] SERVICES [address] [place], [place], [address] Phone: [phone]   Fax:  [phone]  Name: [name] MRN: [id number] Date of Birth: 1980/09/11

## 2020-10-20 ENCOUNTER — Encounter: Payer: BC Managed Care – PPO | Admitting: Speech Pathology

## 2020-10-24 ENCOUNTER — Other Ambulatory Visit: Payer: Self-pay

## 2020-10-24 ENCOUNTER — Encounter: Payer: Self-pay | Admitting: Occupational Therapy

## 2020-10-24 ENCOUNTER — Ambulatory Visit: Payer: BC Managed Care – PPO | Admitting: Occupational Therapy

## 2020-10-24 DIAGNOSIS — M6281 Muscle weakness (generalized): Secondary | ICD-10-CM | POA: Diagnosis not present

## 2020-10-24 DIAGNOSIS — R278 Other lack of coordination: Secondary | ICD-10-CM

## 2020-10-24 NOTE — Therapy (Signed)
[place] MAIN [facility] SERVICES [address] [place], [place], [address] Phone: [phone]   Fax:  [phone]  Occupational Therapy Treatment  Patient Details  Name: [name] MRN: [id number] Date of Birth: [birth year] Referring Provider (OT): Dr. Brigitte Pulse   Encounter Date: 10/24/2020   OT End of Session - 10/24/20 1501     Visit Number 2    Number of Visits 12    Date for OT Re-Evaluation 01/10/21    Authorization Time Period Progress report period starting 10/18/2020    OT Start Time 1455    OT Stop Time 1540    OT Time Calculation (min) 45 min    Activity Tolerance Patient tolerated treatment well    Behavior During Therapy [facility] for tasks assessed/performed             Past Medical History:  Diagnosis Date   Asthma    Breast cyst    Breast mass    Depression    Fibrocystic breast    Fibromyalgia    Migraines     Past Surgical History:  Procedure Laterality Date   BUNIONECTOMY Bilateral    COLONOSCOPY WITH PROPOFOL N/A 08/19/2019   Procedure: COLONOSCOPY WITH PROPOFOL;  Surgeon: Lin Landsman, MD;  Location: [facility] ENDOSCOPY;  Service: Gastroenterology;  Laterality: N/A;   ESOPHAGOGASTRODUODENOSCOPY (EGD) WITH PROPOFOL N/A 08/19/2019   Procedure: ESOPHAGOGASTRODUODENOSCOPY (EGD) WITH PROPOFOL;  Surgeon: Lin Landsman, MD;  Location: [facility] ENDOSCOPY;  Service: Gastroenterology;  Laterality: N/A;   EYE SURGERY     blood clot in eye   HERNIA REPAIR  1991, 2001   inguinal and umbilical   LAPAROSCOPIC GASTRIC SLEEVE RESECTION  2015    There were no vitals filed for this visit.   Subjective Assessment - 10/24/20 1457     Subjective  Pt. was late for the session secondary to needing to run out to the car after checking in at ht ereception desk to get something left in the car.    Pertinent History Pt. is a 40 y.o. adult who was diagnosed with COVID-19 on February 11th. 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 Pt. presents with intermittent numbness in the distal 3rd, and 4th digits. Pt. PMHx includes: Asthma, Breast Cyst, Breast mass, Depression, Fibrocystic breast, Fibromyalgia, and Migraines. Pt. is a parent to 5 children, and was working as an Editor, commissioning.    Currently in Pain? No/denies            OT Treatment  Self-care:  Pt. Brought in detailed laminated lists of daily kitchen, and bathroom chores. Pt.'s daily home management routines were reviewed.  Compensatory strategies were reviewed. Pt. was assisted with streamlining the detailed list of daily chores including daily kitchen management, and bathroom cleaning to create a more simplified version for easier, and quicker access. Reviewed using a caddy to hold, and keep the bathroom cleaning supplies together when moving them from a locked cabinet to the bathroom when needed for cleaning.  There. Ex.:  Pt. performed gross gripping with a gross grip strengthener. Pt. worked on sustaining grip while grasping pegs and reaching at various heights, and performing a circular motion to simulate stirring in a large pot. The Gripper was set to 23.4 # of grip strength resistance. Pt. worked on the Garment/textile technologist maze x's 2 reps without weights, followed by 2 reps with 1# weight in place on the handle.  Pt. reports having had difficulty fluidly formulating, and coordinating a stirring motion  this past week when attempting to stir a chicken and rice dish being made in a large pot.  Pt. reports having had difficulty with grip strength, motor control, and fluidly coordinating the movements. Pt. Was able to streamline, and consolidate each step of the daily kitchen, and bathroom cleaning chore list. Pt. Plans to implement compensatory strategies, and try the simplified lists over the next week. Pt. Education was provided about practicing stirring at home beginning with stirring without resistance, and progressively increasing resistance of the  consistency being stirred. Pt. Education was provided about activities at home to work towards improving stirring motion, and flipping items during meal preparation.                       OT Education - 10/24/20 1500     Education Details streamlining daily chore list,    Person(s) Educated Patient    Methods Explanation    Comprehension Verbalized understanding              OT Short Term Goals - 10/18/20 1554       OT SHORT TERM GOAL #1   Title Pt. will independently demonstrate cognitive compensatory strategies for simulated medication management tasks.    Baseline Eval: Pt. has difficulty with managing medications    Time 4    Period Weeks    Status New    Target Date 10/18/20      OT SHORT TERM GOAL #2   Title --    Period --    Status --               OT Long Term Goals - 10/18/20 1706       OT LONG TERM GOAL #1   Title Pt. will improve activity tolerance to be able to independently, and efficiently complete ADLs, and IADL tasks    Baseline Eval: pt. presents with limited activity tolerance.    Time 8    Period Weeks    Status New    Target Date 12/13/20      OT LONG TERM GOAL #2   Title Pt. will improve FOTO score by 2 points for clinically significant improvements.    Baseline Eval: FOTO : 82    Time 8    Period Weeks    Status New    Target Date 12/13/20      OT LONG TERM GOAL #3   Title Pt. will perform multiple step light meal preparation independently, and safely.    Baseline Eval: Pt. has difficulty with light meal preparation.    Time 8    Period Weeks    Status New    Target Date 12/13/20                   Plan - 10/24/20 1503     Clinical Impression Statement Pt. reports having had difficulty fluidly formulating, and coordinating a stirring motion this past week when attempting to stir a chicken and rice dish being made in a large pot.  Pt. reports having had difficulty with grip strength, motor control,  and fluidly coordinating the movements. Pt. Was able to streamline, and consolidate each step of the daily kitchen, and bathroom cleaning chore list. Pt. Plans to implement compensatory strategies, and try the simplified lists over the next week. Pt. Education was provided about practicing stirring at home beginning with stirring without resistance, and progressively increasing resistance of the consistency being stirred. Pt. Education was provided about activities  at home to work towards improving stirring motion, and flipping items during meal preparation.   OT Occupational Profile and History Problem Focused Assessment - Including review of records relating to presenting problem    Occupational performance deficits (Please refer to evaluation for details): ADL's;IADL's    Body Structure / Function / Physical Skills ADL;FMC;IADL    Rehab Potential Good    Clinical Decision Making Several treatment options, min-mod task modification necessary    Comorbidities Affecting Occupational Performance: May have comorbidities impacting occupational performance    Modification or Assistance to Complete Evaluation  Min-Moderate modification of tasks or assist with assess necessary to complete eval    OT Frequency 1x / week    OT Duration 8 weeks    OT Treatment/Interventions Self-care/ADL training;Therapeutic exercise;DME and/or AE instruction;Patient/family education;Therapeutic activities    Consulted and Agree with Plan of Care Patient             Patient will benefit from skilled therapeutic intervention in order to improve the following deficits and impairments:   Body Structure / Function / Physical Skills: ADL, [place], IADL       Visit Diagnosis: Muscle weakness (generalized)  Other lack of coordination    Problem List Patient Active Problem List   Diagnosis Date Noted   Borderline personality disorder ([place]) 09/21/2020   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   Luetscher's syndrome  02/19/2019   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/2018   Dislocation of patellofemoral joint 10/13/2017   Labor and delivery indication for care or intervention 02/15/2017   Pregnancy 01/01/2017   Encounter for procreative genetic counseling [id number]   Umbilical hernia without obstruction and without gangrene    Norovirus 06/24/2016   ADHD (attention deficit hyperactivity disorder) 06/20/2016   Anxiety 06/20/2016   Asthma without status asthmaticus 06/20/2016   Depression 06/20/2016   Fibrocystic breast disease 06/20/2016   Fibromyalgia 06/20/2016   Migraine 06/20/2016   Ovarian cyst 06/20/2016   Morbid obesity ([place]) 02/24/2014   Headache 07/26/2013   KNEE PAIN, BILATERAL 01/02/2010   TENDINITIS, PATELLAR 01/02/2010    Harrel Carina, MS, OTR/L 10/24/2020, 3:04 PM  [place] MAIN [facility] SERVICES [address] [place], [place], [address] Phone: [phone]   Fax:  [phone]  Name: [name] MRN: [id number] Date of Birth: Apr 24, 1980

## 2020-10-26 ENCOUNTER — Encounter: Payer: BC Managed Care – PPO | Admitting: Occupational Therapy

## 2020-10-30 ENCOUNTER — Encounter (HOSPITAL_COMMUNITY): Payer: Self-pay

## 2020-10-30 ENCOUNTER — Encounter: Payer: BC Managed Care – PPO | Admitting: Occupational Therapy

## 2020-10-30 ENCOUNTER — Emergency Department (HOSPITAL_COMMUNITY)
Admission: EM | Admit: 2020-10-30 | Discharge: 2020-10-30 | Disposition: A | Discharge status: Home / Self Care | Payer: BC Managed Care – PPO | Attending: Emergency Medicine | Admitting: Emergency Medicine

## 2020-10-30 DIAGNOSIS — Z87891 Personal history of nicotine dependence: Secondary | ICD-10-CM | POA: Insufficient documentation

## 2020-10-30 DIAGNOSIS — R109 Unspecified abdominal pain: Secondary | ICD-10-CM | POA: Diagnosis not present

## 2020-10-30 DIAGNOSIS — J45909 Unspecified asthma, uncomplicated: Secondary | ICD-10-CM | POA: Diagnosis not present

## 2020-10-30 DIAGNOSIS — R140 Abdominal distension (gaseous): Secondary | ICD-10-CM | POA: Diagnosis not present

## 2020-10-30 DIAGNOSIS — R252 Cramp and spasm: Secondary | ICD-10-CM | POA: Insufficient documentation

## 2020-10-30 LAB — CBC
HCT: 41.5 % (ref 39.0–52.0)
Hemoglobin: 13.3 g/dL (ref 13.0–17.0)
MCH: 29.6 pg (ref 26.0–34.0)
MCHC: 32 g/dL (ref 30.0–36.0)
MCV: 92.4 fL (ref 80.0–100.0)
Platelets: 256 10*3/uL (ref 150–400)
RBC: 4.49 MIL/uL (ref 4.22–5.81)
RDW: 13.2 % (ref 11.5–15.5)
WBC: 6.5 10*3/uL (ref 4.0–10.5)
nRBC: 0 % (ref 0.0–0.2)

## 2020-10-30 LAB — COMPREHENSIVE METABOLIC PANEL
ALT: 17 U/L (ref 0–44)
AST: 23 U/L (ref 15–41)
Albumin: 3.4 g/dL — ABNORMAL LOW (ref 3.5–5.0)
Alkaline Phosphatase: 87 U/L (ref 38–126)
Anion gap: 4 — ABNORMAL LOW (ref 5–15)
BUN: 6 mg/dL (ref 6–20)
CO2: 28 mmol/L (ref 22–32)
Calcium: 8.1 mg/dL — ABNORMAL LOW (ref 8.9–10.3)
Chloride: 106 mmol/L (ref 98–111)
Creatinine, Ser: 0.91 mg/dL (ref 0.61–1.24)
GFR, Estimated: >60 mL/min (ref >60)
Glucose, Bld: 70 mg/dL (ref 70–99)
Potassium: 3.7 mmol/L (ref 3.5–5.1)
Sodium: 138 mmol/L (ref 135–145)
Total Bilirubin: 0.5 mg/dL (ref 0.3–1.2)
Total Protein: 6.2 g/dL — ABNORMAL LOW (ref 6.5–8.1)

## 2020-10-30 LAB — URINALYSIS, ROUTINE W REFLEX MICROSCOPIC
Bilirubin Urine: NEGATIVE
Glucose, UA: NEGATIVE mg/dL
Hgb urine dipstick: NEGATIVE
Ketones, ur: NEGATIVE mg/dL
Nitrite: NEGATIVE
Protein, ur: NEGATIVE mg/dL
Specific Gravity, Urine: 1.012 (ref 1.005–1.030)
pH: 6 (ref 5.0–8.0)

## 2020-10-30 LAB — LIPASE, BLOOD: Lipase: 31 U/L (ref 11–51)

## 2020-10-30 LAB — CK: Total CK: 377 U/L (ref 49–397)

## 2020-10-30 MED ORDER — SODIUM CHLORIDE 0.9 % IV BOLUS
1500.0000 mL | Freq: Once | INTRAVENOUS | Status: AC
  Administered 2020-10-30: 1500 mL via INTRAVENOUS

## 2020-10-30 MED ORDER — AMPHETAMINE-DEXTROAMPHETAMINE 5 MG PO TABS
15.0000 mg | ORAL_TABLET | Freq: Every day | ORAL | Status: AC
  Administered 2020-10-30: 15 mg via ORAL
  Filled 2020-10-30: qty 1

## 2020-10-30 MED ORDER — BUSPIRONE HCL 10 MG PO TABS
15.0000 mg | ORAL_TABLET | Freq: Once | ORAL | Status: AC
  Administered 2020-10-30: 15 mg via ORAL
  Filled 2020-10-30: qty 2

## 2020-10-30 NOTE — ED Provider Notes (Signed)
[place] EMERGENCY DEPARTMENT Provider Note   CSN: [id number] Arrival date & time: 10/30/20  0414     History Chief Complaint  Patient presents with   Abdominal Pain    [name] is a 40 y.o. adult.  Patient is a 40 year old transgender male to male with history of fibromyalgia, borderline personality disorder, and history of bariatric surgery who presents to the emergency department complaining of feeling "dehydrated".  Patient complaining of cramping in his abdomen, thighs.  Symptoms woke the patient from sleep.  States that he has been experiencing night sweats over the past few days; wakes up "drenched in sweat".  Feels this is the catalyst for his dehydration tonight.  Reports trying to hydrate with oral Pedialyte.  Continues to have regular bowel movements.  Is passing flatus.  No nausea, vomiting, fevers.  The history is provided by the patient. No language interpreter was used.  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/2018   Dislocation of patellofemoral joint 10/13/2017   Labor and delivery indication for care or intervention 02/15/2017   Pregnancy 01/01/2017   Encounter for procreative genetic counseling 99991111   Umbilical hernia without obstruction and without gangrene    Norovirus 06/24/2016   ADHD (attention deficit hyperactivity disorder) 06/20/2016   Anxiety 06/20/2016   Asthma without status asthmaticus 06/20/2016   Depression 06/20/2016   Fibrocystic breast disease 06/20/2016   Fibromyalgia 06/20/2016   Migraine 06/20/2016   Ovarian cyst 06/20/2016   Morbid obesity ([place]) 02/24/2014    Headache 07/26/2013   KNEE PAIN, BILATERAL 01/02/2010   TENDINITIS, PATELLAR 01/02/2010    Past Surgical History:  Procedure Laterality Date   BUNIONECTOMY Bilateral    COLONOSCOPY WITH PROPOFOL N/A 08/19/2019   Procedure: COLONOSCOPY WITH PROPOFOL;  Surgeon: Lin Landsman, MD;  Location: [place];  Service: Gastroenterology;  Laterality: N/A;   ESOPHAGOGASTRODUODENOSCOPY (EGD) WITH PROPOFOL N/A 08/19/2019   Procedure: ESOPHAGOGASTRODUODENOSCOPY (EGD) WITH PROPOFOL;  Surgeon: Lin Landsman, MD;  Location: [facility] ENDOSCOPY;  Service: Gastroenterology;  Laterality: N/A;   EYE SURGERY     blood clot in eye   HERNIA REPAIR  1991, 2001   inguinal and umbilical   LAPAROSCOPIC GASTRIC SLEEVE RESECTION  2015     OB History     Gravida  2   Para      Term      Preterm      AB      Living  0      SAB  0   IAB      Ectopic      Multiple      Live Births              Family History  Problem Relation Age of Onset   Breast cancer Paternal 71    Breast cancer Paternal 45    Healthy Mother    CVA Father    Hypertension Father    Diabetes Father    Breast cancer Paternal Grandmother     Social History   Tobacco Use  Smoking status: Former    Types: Cigarettes    Quit date: 06/21/2003    Years since quitting: 17.3   Smokeless tobacco: Never  Substance Use Topics   Alcohol use: No   Drug use: No    Home Medications Prior to Admission medications   Medication Sig Start Date End Date Taking? Authorizing Provider  albuterol (VENTOLIN HFA) 108 (90 Base) MCG/ACT inhaler Inhale 1-2 puffs into the lungs every 4 (four) hours as needed for shortness of breath or wheezing.   Yes [provider]  amphetamine-dextroamphetamine (ADDERALL) 15 MG tablet Take 15 mg by mouth 3 (three) times daily. 10/09/20  Yes [provider]  B Complex-C (SUPER B COMPLEX/VITAMIN C PO) Take 1 tablet by mouth daily.    Yes [provider]  BD  DISP NEEDLES 25G X 5/8" [place] Once a Week 09/29/19  Yes [provider]  busPIRone (BUSPAR) 15 MG tablet Take 15 mg by mouth 4 (four) times daily. 10/09/20  Yes [provider]  Calcium Carbonate (CALCIUM 500 PO) Take 500 mg by mouth 4 (four) times daily. chewable   Yes [provider]  cholecalciferol (VITAMIN D) 1000 units tablet Take 1,000 Units by mouth daily.   Yes [provider]  dronabinol (MARINOL) 5 MG capsule Take 5 mg by mouth 2 (two) times daily as needed (abdominal pain). 06/24/19  Yes [provider]  Multiple Vitamins-Minerals (ADEK GUMMIES PLUS ZN PO) Take 1 tablet by mouth daily.   Yes [provider]  neomycin-polymyxin b-dexamethasone (MAXITROL) 3.5-10000-0.1 SUSP Place 1 drop into both eyes daily as needed (dryness/inflammation).   Yes [provider]  ondansetron (ZOFRAN-ODT) 4 MG disintegrating tablet Take 4 mg by mouth every 8 (eight) hours as needed for vomiting or nausea. 04/10/19  Yes [provider]  OVER THE COUNTER MEDICATION Take 1 capsule by mouth daily. Lypoic acid   Yes [provider]  testosterone cypionate (DEPOTESTOSTERONE CYPIONATE) 200 MG/ML injection Inject 0.3 mg into the muscle every Saturday. 09/29/19  Yes [provider]  vitamin E 1000 UNIT capsule Take 1,000 Units by mouth daily.   Yes [provider]  zinc gluconate 50 MG tablet Take 50 mg by mouth in the morning and at bedtime.   Yes [provider]  zolmitriptan (ZOMIG-ZMT) 2.5 MG disintegrating tablet Take 2.5 mg by mouth as needed for migraine. 03/24/19  Yes [provider]  escitalopram (LEXAPRO) 20 MG tablet Take 20 mg by mouth daily. Patient not taking: No sig reported 10/09/20   [provider]    Allergies    Penicillins, Remdesivir, Chlorhexidine, Chlorhexidine gluconate, Clonazepam, Dexamethasone, Doxycycline, Fluoxetine, Nsaids, Other, Tolmetin,  Hydrocodone-acetaminophen, Tape, and Tuberculin ppd  Review of Systems   Review of Systems  Gastrointestinal:  Positive for abdominal pain.  Ten systems reviewed and are negative for acute change, except as noted in the HPI.    Physical Exam Updated Vital Signs BP 124/78 (BP Location: Left Arm)   Pulse 87   Temp 98.5 F (36.9 C) (Oral)   Resp 17   SpO2 100%   Physical Exam Vitals and nursing note reviewed.  Constitutional:      General: He is not in acute distress.    Appearance: He is well-developed. He is not diaphoretic.     Comments: Standing, hunched over exam bed, swaying. Appears uncomfortable.  HENT:     Head: Normocephalic and atraumatic.  Eyes:     General: No scleral icterus.  Conjunctiva/sclera: Conjunctivae normal.  Cardiovascular:     Rate and Rhythm: Normal rate and regular rhythm.     Pulses: Normal pulses.  Pulmonary:     Effort: Pulmonary effort is normal. No respiratory distress.     Comments: Respirations even and unlabored Abdominal:     General: There is distension (mild).     Palpations: There is no mass.     Tenderness: There is no abdominal tenderness. There is no guarding.     Hernia: No hernia is present.     Comments: Soft, nontender. Mild distension without rigidity, peritoneal signs, masses.  Musculoskeletal:        General: Normal range of motion.     Cervical back: Normal range of motion.  Skin:    General: Skin is warm and dry.     Coloration: Skin is not pale.     Findings: No erythema or rash.  Neurological:     Mental Status: He is alert and oriented to person, place, and time.     Coordination: Coordination normal.  Psychiatric:        Behavior: Behavior normal.    ED Results / Procedures / Treatments   Labs (all labs ordered are listed, but only abnormal results are displayed) Labs Reviewed  COMPREHENSIVE METABOLIC PANEL - Abnormal; Notable for the following components:      Result Value   Calcium 8.1 (*)    Total  Protein 6.2 (*)    Albumin 3.4 (*)    Anion gap 4 (*)    All other components within normal limits  URINALYSIS, ROUTINE W REFLEX MICROSCOPIC - Abnormal; Notable for the following components:   APPearance HAZY (*)    Leukocytes,Ua LARGE (*)    Bacteria, UA RARE (*)    All other components within normal limits  LIPASE, BLOOD  CBC  CK    EKG None  Radiology No results found.  Procedures Procedures   Medications Ordered in ED Medications  busPIRone (BUSPAR) tablet 15 mg (has no administration in time range)  amphetamine-dextroamphetamine (ADDERALL) tablet 15 mg (has no administration in time range)  sodium chloride 0.9 % bolus 1,500 mL (1,500 mLs Intravenous New Bag/Given 10/30/20 0542)    ED Course  I have reviewed the triage vital signs and the nursing notes.  Pertinent labs & imaging results that were available during my care of the patient were reviewed by me and considered in my medical decision making (see chart for details).  Clinical Course as of 10/30/20 [id number]  [facility] Oct 30, 2020  [id number] Patient feeling better with IVF. Requesting AM medications. Will order. Abdomen is nontender to palpation. [KH]  0617 CK normal [KH]    Clinical Course User Index [KH] Beverely Pace   MDM Rules/Calculators/A&P                           40 year old transgender male to male presenting to the emergency department for evaluation of muscle cramps and spasms in bilateral thighs as well as abdomen.  No nausea, vomiting, diarrhea.  Continues to pass flatus.    Laboratory evaluation is reassuring today.  No leukocytosis or fever to suggest infectious etiology.  No electrolyte derangements.  Liver and kidney function preserved.  Normal lipase.  CK is also reassuring.  The patient has been reassessed and reports improvement in symptoms with IV fluids.  Abdomen remains nontender.  Do not feel further emergent work-up is presently indicated.  Counseled on  hydration and need for outpatient  primary care follow-up.  Return precautions discussed and provided. Patient to be discharged once IVFs completed. All concerns addressed.   Final Clinical Impression(s) / ED Diagnoses Final diagnoses:  Muscle cramps    Rx / DC Orders ED Discharge Orders     None        Antonietta Breach, PA-C 10/30/20 [id number]    Maudie Flakes, MD 10/30/20 0700

## 2020-10-30 NOTE — ED Triage Notes (Signed)
Pt states that he is dehydrated, hx of gastric sleeve and SBO, reports waking up with night sweats, muscle cramps, and abd pain

## 2020-10-30 NOTE — Discharge Instructions (Addendum)
Try to maintain good fluid intake to prevent dehydration.  Your work-up in the emergency department has been reassuring.  Continue follow-up with your primary care doctor.  Return for new or concerning symptoms.

## 2020-11-01 ENCOUNTER — Other Ambulatory Visit: Payer: Self-pay

## 2020-11-01 ENCOUNTER — Ambulatory Visit: Payer: BC Managed Care – PPO | Admitting: Occupational Therapy

## 2020-11-01 ENCOUNTER — Encounter: Payer: Self-pay | Admitting: Occupational Therapy

## 2020-11-01 DIAGNOSIS — R278 Other lack of coordination: Secondary | ICD-10-CM

## 2020-11-01 DIAGNOSIS — R41841 Cognitive communication deficit: Secondary | ICD-10-CM

## 2020-11-01 DIAGNOSIS — U099 Post covid-19 condition, unspecified: Secondary | ICD-10-CM

## 2020-11-01 DIAGNOSIS — M6281 Muscle weakness (generalized): Secondary | ICD-10-CM | POA: Diagnosis not present

## 2020-11-01 NOTE — Therapy (Signed)
[place] MAIN [facility] SERVICES [address] [place], [place], [address] Phone: [phone]   Fax:  [phone]  Occupational Therapy Treatment  Patient Details  Name: [name] MRN: [id number] Date of Birth: 05/16/1980 Referring Provider (OT): Dr. Brigitte Pulse   Encounter Date: 11/01/2020   OT End of Session - 11/01/20 1508     Visit Number 3    Number of Visits 12    Date for OT Re-Evaluation 01/10/21    Authorization Time Period Progress report period starting 10/18/2020    OT Start Time 0915    OT Stop Time 1003    OT Time Calculation (min) 48 min    Activity Tolerance Patient tolerated treatment well    Behavior During Therapy [facility] for tasks assessed/performed             Past Medical History:  Diagnosis Date   Asthma    Breast cyst    Breast mass    Depression    Fibrocystic breast    Fibromyalgia    Migraines     Past Surgical History:  Procedure Laterality Date   BUNIONECTOMY Bilateral    COLONOSCOPY WITH PROPOFOL N/A 08/19/2019   Procedure: COLONOSCOPY WITH PROPOFOL;  Surgeon: Lin Landsman, MD;  Location: [facility] ENDOSCOPY;  Service: Gastroenterology;  Laterality: N/A;   ESOPHAGOGASTRODUODENOSCOPY (EGD) WITH PROPOFOL N/A 08/19/2019   Procedure: ESOPHAGOGASTRODUODENOSCOPY (EGD) WITH PROPOFOL;  Surgeon: Lin Landsman, MD;  Location: [facility] ENDOSCOPY;  Service: Gastroenterology;  Laterality: N/A;   EYE SURGERY     blood clot in eye   HERNIA REPAIR  1991, 2001   inguinal and umbilical   LAPAROSCOPIC GASTRIC SLEEVE RESECTION  2015    There were no vitals filed for this visit.   Subjective Assessment - 11/01/20 1505     Subjective  Pt reports he ordered daily medication pill organizer which is going well.  Has 4 doses of meds a day and each are 4 hours apart.  He does not take the first pill at the same time each day but when taking it, he notes the time and then puts 3 more alerts on his phone in 4 hour increments  as reminders to take meds.  So far this has worked well this week.    Pertinent History Pt. is a 40 y.o. adult who was diagnosed with COVID-19 on February 11th. 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 Pt. presents with intermittent numbness in the distal 3rd, and 4th digits. Pt. PMHx includes: Asthma, Breast Cyst, Breast mass, Depression, Fibrocystic breast, Fibromyalgia, and Migraines. Pt. is a parent to 5 children, and was working as an Editor, commissioning.    Patient Stated Goals Pt reports he would like to be as independent as possible. 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 No/denies    Multiple Pain Sites No              Medication management-Pt brought in daily pill organizer and reviewed use.  Pt will take first dose of medication in the am upon waking and then sets alarms on phone to alert him when the next doses are to be taken, every 4 hours.  This has been working well this week, no changes or modifications at this time but will continue to monitor and make recommendations if problems arise with consistency of medication management.    Neuromuscular reeducation: Pt seen for use  of Chinese balls, 1/5 in diameter in right and left hand for in hand manipulation skills.  Right hand able to perform counterclockwise but has difficulty clockwise and requires cues for use of thumb to assist with task.  Left hand able to complete but has to perform with slow deliberate motions and not yet fluid.  Pt reports he has chinese balls at home, will look for them but states, "I use to be able to do this much better and smoother." Manipulation of Glass beads from flat surface, some with flat side up and others with flat side down.  Picking up with left hand one by one to place into container and then advanced to attempting translatory movements of the hand.  Pt demonstrates difficulty with translatory movements  especially from palm to fingertips.  Using the hand for storage requires increase focus and cues to not drop items.    Therapeutic Exercise: Grip strengthening with use of resistive hand gripper, 3rd and 4th settings, 17# and 23# for 20 reps for 1 set each.  Cues for grip on hand gripper. Pt discussing difficulty with stirring pot, tried motion at home without anything in pot, then with water, then tried with rice and could not coordinate movements with hand and wrist.  Discussed motion and performing in simulated fashion, change of grasp recommended to gross grasping pattern.  Pt to try alternative methods at home and report back.    Response to tx: Pt progressing well with medication management and will continue current strategies this week.  Coordination skills remain impaired and pt also demonstrates some motor planning and proprioception limitations in left hand and wrist affecting her ability to perform meal prep tasks.  Difficulty with in hand manipulation skills and translatory skills of the hand and working to improve these skills as well as smoother, coordinated movements.  Continue to work towards goals in plan of care to maximize safety and independence with daily tasks.                      OT Education - 11/01/20 1508     Education Details review of medication strategies, [place] tasks    Person(s) Educated Patient    Methods Explanation    Comprehension Verbalized understanding              OT Short Term Goals - 10/18/20 1554       OT SHORT TERM GOAL #1   Title Pt. will independently demonstrate cognitive compensatory strategies for simulated medication management tasks.    Baseline Eval: Pt. has difficulty with managing medications    Time 4    Period Weeks    Status New    Target Date 10/18/20      OT SHORT TERM GOAL #2   Title --    Period --    Status --               OT Long Term Goals - 10/18/20 1706       OT LONG TERM GOAL #1   Title  Pt. will improve activity tolerance to be able to independently, and efficiently complete ADLs, and IADL tasks    Baseline Eval: pt. presents with limited activity tolerance.    Time 8    Period Weeks    Status New    Target Date 12/13/20      OT LONG TERM GOAL #2   Title Pt. will improve FOTO score by 2 points for clinically significant improvements.  Baseline Eval: FOTO : 82    Time 8    Period Weeks    Status New    Target Date 12/13/20      OT LONG TERM GOAL #3   Title Pt. will perform multiple step light meal preparation independently, and safely.    Baseline Eval: Pt. has difficulty with light meal preparation.    Time 8    Period Weeks    Status New    Target Date 12/13/20                   Plan - 11/01/20 1509     Clinical Impression Statement Pt progressing well with medication management and will continue current strategies this week.  Coordination skills remain impaired and pt also demonstrates some motor planning and proprioception limitations in left hand and wrist affecting her ability to perform meal prep tasks.  Difficulty with in hand manipulation skills and translatory skills of the hand and working to improve these skills as well as smoother, coordinated movements.  Continue to work towards goals in plan of care to maximize safety and independence with daily tasks.    OT Occupational Profile and History Problem Focused Assessment - Including review of records relating to presenting problem    Occupational performance deficits (Please refer to evaluation for details): ADL's;IADL's    Body Structure / Function / Physical Skills ADL;FMC;IADL    Rehab Potential Good    Clinical Decision Making Several treatment options, min-mod task modification necessary    Comorbidities Affecting Occupational Performance: May have comorbidities impacting occupational performance    Modification or Assistance to Complete Evaluation  Min-Moderate modification of tasks or  assist with assess necessary to complete eval    OT Frequency 1x / week    OT Duration 8 weeks    OT Treatment/Interventions Self-care/ADL training;Therapeutic exercise;DME and/or AE instruction;Patient/family education;Therapeutic activities    Consulted and Agree with Plan of Care Patient             Patient will benefit from skilled therapeutic intervention in order to improve the following deficits and impairments:   Body Structure / Function / Physical Skills: ADL, [place], IADL       Visit Diagnosis: Muscle weakness (generalized)  Other lack of coordination  Cognitive communication deficit  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    Problem List Patient Active Problem List   Diagnosis Date Noted   Borderline personality disorder ([place]) 09/21/2020   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/2018   Dislocation of patellofemoral joint 10/13/2017   Labor and delivery indication for care or intervention 02/15/2017   Pregnancy 01/01/2017   Encounter for procreative genetic counseling [id number]   Umbilical hernia without obstruction and without gangrene    Norovirus 06/24/2016   ADHD (attention deficit hyperactivity disorder) 06/20/2016   Anxiety 06/20/2016   Asthma without status asthmaticus 06/20/2016   Depression 06/20/2016   Fibrocystic breast disease 06/20/2016   Fibromyalgia 06/20/2016   Migraine 06/20/2016   Ovarian cyst 06/20/2016   Morbid obesity ([place]) 02/24/2014   Headache 07/26/2013   KNEE PAIN, BILATERAL 01/02/2010   TENDINITIS, PATELLAR 01/02/2010   Donnie Panik T Chamara Dyck, OTR/L, CLT  Shantella Blubaugh 11/02/2020, 5:54 PM  [place] MAIN [facility] SERVICES [address] [place], [place], [address] Phone: [phone]   Fax:  [phone]  Name: [name] MRN: [id number] Date of Birth: 1981-01-22

## 2020-11-03 ENCOUNTER — Encounter: Payer: BC Managed Care – PPO | Admitting: Speech Pathology

## 2020-11-06 ENCOUNTER — Encounter: Payer: BC Managed Care – PPO | Admitting: Occupational Therapy

## 2020-11-08 ENCOUNTER — Ambulatory Visit: Payer: BC Managed Care – PPO | Admitting: Occupational Therapy

## 2020-11-10 ENCOUNTER — Ambulatory Visit: Payer: BC Managed Care – PPO

## 2020-11-10 ENCOUNTER — Encounter: Payer: BC Managed Care – PPO | Admitting: Speech Pathology

## 2020-11-14 ENCOUNTER — Ambulatory Visit: Payer: BC Managed Care – PPO | Attending: Physician Assistant | Admitting: Occupational Therapy

## 2020-11-14 DIAGNOSIS — R278 Other lack of coordination: Secondary | ICD-10-CM | POA: Insufficient documentation

## 2020-11-14 DIAGNOSIS — M6281 Muscle weakness (generalized): Secondary | ICD-10-CM | POA: Insufficient documentation

## 2020-11-15 ENCOUNTER — Encounter: Payer: BC Managed Care – PPO | Admitting: Speech Pathology

## 2020-11-17 ENCOUNTER — Encounter: Payer: BC Managed Care – PPO | Admitting: Speech Pathology

## 2020-11-18 ENCOUNTER — Encounter: Payer: Self-pay | Admitting: Occupational Therapy

## 2020-11-20 ENCOUNTER — Encounter: Payer: Self-pay | Admitting: Occupational Therapy

## 2020-11-20 ENCOUNTER — Other Ambulatory Visit: Payer: Self-pay

## 2020-11-20 ENCOUNTER — Ambulatory Visit: Payer: BC Managed Care – PPO | Admitting: Occupational Therapy

## 2020-11-20 DIAGNOSIS — R278 Other lack of coordination: Secondary | ICD-10-CM

## 2020-11-20 DIAGNOSIS — M6281 Muscle weakness (generalized): Secondary | ICD-10-CM | POA: Diagnosis not present

## 2020-11-20 NOTE — Therapy (Signed)
[place] MAIN [facility] SERVICES [address] [place], [place], [address] Phone: [phone]   Fax:  [phone]  Occupational Therapy Treatment  Patient Details  Name: [name] MRN: [id number] Date of Birth: Mar 20, 1980 Referring Provider (OT): Dr. Brigitte Pulse   Encounter Date: 11/20/2020   OT End of Session - 11/20/20 1323     Visit Number 4    Number of Visits 12    Date for OT Re-Evaluation 01/10/21    Authorization Time Period Progress report period starting 10/18/2020    OT Start Time 1315    OT Stop Time 1350    OT Time Calculation (min) 35 min    Activity Tolerance Patient tolerated treatment well    Behavior During Therapy [facility] for tasks assessed/performed             Past Medical History:  Diagnosis Date   Asthma    Breast cyst    Breast mass    Depression    Fibrocystic breast    Fibromyalgia    Migraines     Past Surgical History:  Procedure Laterality Date   BUNIONECTOMY Bilateral    COLONOSCOPY WITH PROPOFOL N/A 08/19/2019   Procedure: COLONOSCOPY WITH PROPOFOL;  Surgeon: Lin Landsman, MD;  Location: [facility] ENDOSCOPY;  Service: Gastroenterology;  Laterality: N/A;   ESOPHAGOGASTRODUODENOSCOPY (EGD) WITH PROPOFOL N/A 08/19/2019   Procedure: ESOPHAGOGASTRODUODENOSCOPY (EGD) WITH PROPOFOL;  Surgeon: Lin Landsman, MD;  Location: [facility] ENDOSCOPY;  Service: Gastroenterology;  Laterality: N/A;   EYE SURGERY     blood clot in eye   HERNIA REPAIR  1991, 2001   inguinal and umbilical   LAPAROSCOPIC GASTRIC SLEEVE RESECTION  2015    There were no vitals filed for this visit.  OT TREATMENT    Neuro muscular re-education:  Pt. worked on left hand [facility] skills, grasping and storing 1", 3/4", and 1/2" objects in the palm of the hand, and translatory movements moving them from her palm to the tip of the 2nd digit, and thumb.   Therapeutic Exercise:  Pt. worked on wrist extension, and radial deviation with 2# weight.  Pt. education was provided about hand position, and proper technique during the exercises.   Pt. is in the process of having some home repairs, and renovations performed at home. Pt. reports they have been staying at a friends home until the repairs have been completed. Pt. Reports that he has improved with medication management using a pillbox. Pt. Continues to present with limitations with using his wrist, and securely holding items with his thumb. Pt. Continues to work towards improving wrist strength, hand function skills., and [facility] skills with his left hand in order to improve, and maximize independence with ADLs, and IADLs.                         OT Education - 11/20/20 1323     Education Details review of medication strategies, [facility] tasks    Person(s) Educated Patient    Methods Explanation    Comprehension Verbalized understanding              OT Short Term Goals - 10/18/20 1554       OT SHORT TERM GOAL #1   Title Pt. will independently demonstrate cognitive compensatory strategies for simulated medication management tasks.    Baseline Eval: Pt. has difficulty with managing medications    Time 4    Period Weeks    Status  New    Target Date 10/18/20      OT SHORT TERM GOAL #2   Title --    Period --    Status --               OT Long Term Goals - 10/18/20 1706       OT LONG TERM GOAL #1   Title Pt. will improve activity tolerance to be able to independently, and efficiently complete ADLs, and IADL tasks    Baseline Eval: pt. presents with limited activity tolerance.    Time 8    Period Weeks    Status New    Target Date 12/13/20      OT LONG TERM GOAL #2   Title Pt. will improve FOTO score by 2 points for clinically significant improvements.    Baseline Eval: FOTO : 82    Time 8    Period Weeks    Status New    Target Date 12/13/20      OT LONG TERM GOAL #3   Title Pt. will perform multiple step light meal preparation  independently, and safely.    Baseline Eval: Pt. has difficulty with light meal preparation.    Time 8    Period Weeks    Status New    Target Date 12/13/20                   Plan - 11/20/20 1417     Clinical Impression Statement Pt. is in the process of having some home repairs, and renovations performed at home. Pt. reports they have been staying at a friends home until the repairs have been completed. Pt. Reports that he has improved with medication management using a pillbox. Pt. Continues to present with limitations with using his wrist, and securely holding items with his thumb. Pt. Continues to work towards improving wrist strength, hand function skills., and [facility] skills with his left hand in order to improve, and maximize independence with ADLs, and IADLs.   OT Occupational Profile and History Problem Focused Assessment - Including review of records relating to presenting problem    Occupational performance deficits (Please refer to evaluation for details): ADL's;IADL's    Body Structure / Function / Physical Skills ADL;FMC;IADL    Rehab Potential Good    Clinical Decision Making Several treatment options, min-mod task modification necessary    Comorbidities Affecting Occupational Performance: May have comorbidities impacting occupational performance    Modification or Assistance to Complete Evaluation  Min-Moderate modification of tasks or assist with assess necessary to complete eval    OT Frequency 1x / week    OT Duration 8 weeks    OT Treatment/Interventions Self-care/ADL training;Therapeutic exercise;DME and/or AE instruction;Patient/family education;Therapeutic activities    Consulted and Agree with Plan of Care Patient             Patient will benefit from skilled therapeutic intervention in order to improve the following deficits and impairments:   Body Structure / Function / Physical Skills: ADL, [place], IADL       Visit Diagnosis: Muscle weakness  (generalized)  Other lack of coordination    Problem List Patient Active Problem List   Diagnosis Date Noted   Borderline personality disorder ([place]) 09/21/2020   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/2018   Dislocation of patellofemoral joint 10/13/2017   Labor and delivery indication for care or intervention 02/15/2017   Pregnancy 01/01/2017  Encounter for procreative genetic counseling [id number]   Umbilical hernia without obstruction and without gangrene    Norovirus 06/24/2016   ADHD (attention deficit hyperactivity disorder) 06/20/2016   Anxiety 06/20/2016   Asthma without status asthmaticus 06/20/2016   Depression 06/20/2016   Fibrocystic breast disease 06/20/2016   Fibromyalgia 06/20/2016   Migraine 06/20/2016   Ovarian cyst 06/20/2016   Morbid obesity ([place]) 02/24/2014   Headache 07/26/2013   KNEE PAIN, BILATERAL 01/02/2010   TENDINITIS, PATELLAR 01/02/2010    Harrel Carina, MS, OTR/L 11/20/2020, 2:18 PM  [place] MAIN [facility] SERVICES [address] [place], [place], [address] Phone: [phone]   Fax:  [phone]  Name: [name] MRN: [id number] Date of Birth: [birth year]

## 2020-11-22 ENCOUNTER — Ambulatory Visit: Payer: BC Managed Care – PPO | Admitting: Occupational Therapy

## 2020-11-24 ENCOUNTER — Other Ambulatory Visit: Payer: Self-pay | Admitting: Family Medicine

## 2020-11-24 ENCOUNTER — Other Ambulatory Visit: Payer: Self-pay

## 2020-11-24 ENCOUNTER — Ambulatory Visit
Admission: RE | Admit: 2020-11-24 | Discharge: 2020-11-24 | Disposition: A | Discharge status: Home / Self Care | Payer: Self-pay | Source: Ambulatory Visit | Attending: Family Medicine | Admitting: Family Medicine

## 2020-11-24 DIAGNOSIS — R1013 Epigastric pain: Secondary | ICD-10-CM

## 2020-11-24 DIAGNOSIS — R10813 Right lower quadrant abdominal tenderness: Secondary | ICD-10-CM

## 2020-11-27 ENCOUNTER — Ambulatory Visit: Payer: BC Managed Care – PPO | Admitting: Occupational Therapy

## 2020-11-29 ENCOUNTER — Encounter: Payer: Self-pay | Admitting: Occupational Therapy

## 2020-11-29 ENCOUNTER — Other Ambulatory Visit: Payer: Self-pay

## 2020-11-29 ENCOUNTER — Ambulatory Visit: Payer: BC Managed Care – PPO | Admitting: Occupational Therapy

## 2020-11-29 DIAGNOSIS — M6281 Muscle weakness (generalized): Secondary | ICD-10-CM | POA: Diagnosis not present

## 2020-11-29 DIAGNOSIS — R278 Other lack of coordination: Secondary | ICD-10-CM
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
## 2020-12-04 ENCOUNTER — Ambulatory Visit: Payer: BC Managed Care – PPO | Admitting: Occupational Therapy

## 2020-12-04 ENCOUNTER — Other Ambulatory Visit: Payer: Self-pay

## 2020-12-04 DIAGNOSIS — M6281 Muscle weakness (generalized): Secondary | ICD-10-CM

## 2020-12-04 DIAGNOSIS — R278 Other lack of coordination: Secondary | ICD-10-CM

## 2020-12-04 NOTE — Therapy (Addendum)
[place] MAIN [facility] SERVICES [address] [place], [place], [address] Phone: [phone]   Fax:  [phone]  Occupational Therapy Treatment/Discharge Note  Patient Details  Name: [name] MRN: [id number] Date of Birth: May 28, 1980 Referring Provider (OT): Dr. Brigitte Pulse   Encounter Date: 12/04/2020   OT End of Session - 12/04/20 1421     Visit Number 6    Number of Visits 12    Date for OT Re-Evaluation 01/10/21    Authorization Time Period Progress report period starting 10/18/2020    OT Start Time 1409    OT Stop Time 1445    OT Time Calculation (min) 36 min    Activity Tolerance Patient tolerated treatment well    Behavior During Therapy [facility] for tasks assessed/performed             Past Medical History:  Diagnosis Date   Asthma    Breast cyst    Breast mass    Depression    Fibrocystic breast    Fibromyalgia    Migraines     Past Surgical History:  Procedure Laterality Date   BUNIONECTOMY Bilateral    COLONOSCOPY WITH PROPOFOL N/A 08/19/2019   Procedure: COLONOSCOPY WITH PROPOFOL;  Surgeon: Lin Landsman, MD;  Location: [facility] ENDOSCOPY;  Service: Gastroenterology;  Laterality: N/A;   ESOPHAGOGASTRODUODENOSCOPY (EGD) WITH PROPOFOL N/A 08/19/2019   Procedure: ESOPHAGOGASTRODUODENOSCOPY (EGD) WITH PROPOFOL;  Surgeon: Lin Landsman, MD;  Location: [facility] ENDOSCOPY;  Service: Gastroenterology;  Laterality: N/A;   EYE SURGERY     blood clot in eye   HERNIA REPAIR  1991, 2001   inguinal and umbilical   LAPAROSCOPIC GASTRIC SLEEVE RESECTION  2015    There were no vitals filed for this visit.       [facility] OT Assessment - 12/04/20 1414       Coordination   Left 9 Hole Peg Test 22      Strength   Overall Strength Within functional limits for tasks performed      Hand Function   Left Hand Grip (lbs) 82    Left Hand Lateral Pinch 19 lbs    Left 3 point pinch 25 lbs            Measurements were obtained,  and goals were reviewed with the pt. Pt. has made progress overall during OT. Pt. has progressed with the LUE grip strength, pinch strength, and [place] skills. Pt. is now engaging the LUE more during daily ADL, and IADL tasks. Pt. is able to perform medication management tasks while consistently using compensatory strategies. Pt. Is now able to use the left hand more during cooking tasks. Pt. education was provided about exercises, and activities for [facility] skills at home. Pt. is ready for discharge from OT services at this time, and scheduled for surgery next week.                       OT Education - 12/04/20 1421     Education Details review of medication strategies, [place] tasks    Person(s) Educated Patient    Methods Explanation    Comprehension Verbalized understanding              OT Short Term Goals - 12/04/20 1423       OT SHORT TERM GOAL #1   Title Pt. will independently demonstrate cognitive compensatory strategies for simulated medication management tasks.    Baseline D/C: Pt. is utilizing cognitive  compensatory strategies, alarms, and a pillbox to assist with medication management. Eval: Pt. has difficulty with managing medications    Time 4    Period Weeks    Status Achieved               OT Long Term Goals - 12/04/20 1430       OT LONG TERM GOAL #1   Title Pt. will improve activity tolerance to be able to independently, and efficiently complete ADLs, and IADL tasks    Baseline D/C: Pt. has improved activity tolerance for ADLs, and IADLs. pt. continues to fatigue, and at times depending upon what time he has gone to sleep the evening prior. Eval: pt. presents with limited activity tolerance.    Time 8    Period Weeks    Status Partially Met      OT LONG TERM GOAL #2   Title Pt. will improve FOTO score by 2 points for clinically significant improvements.    Baseline D/C: FOTO: 67 Eval: FOTO : 82    Time 8    Period Weeks    Status Not Met     Target Date 12/13/20      OT LONG TERM GOAL #3   Title Pt. will perform multiple step light meal preparation independently, and safely.    Baseline D/C: Pt. reports feeling stronger, and using his Left hand more during the task, however has not been cooking quite as much currently, as they are in the middle of renovations. Eval: Pt. has difficulty with light meal preparation.    Time 8    Period Weeks    Status Partially Met                   Plan - 12/04/20 1423     Clinical Impression Statement Measurements were obtained, and goals were reviewed with the pt. Pt. has made progress overall during OT. Pt. has progressed with the LUE grip strength, pinch strength, and [place] skills. Pt. is now engaging the LUE more during daily ADL, and IADL tasks. Pt. is able to perform medication management tasks while consistently using compensatory strategies. Pt. Is now able to use the left hand more during cooking tasks. Pt. education was provided about exercises, and activities for [facility] skills at home. Pt. is ready for discharge for OT services at this time, and scheduled for surgery next week.     OT Occupational Profile and History Problem Focused Assessment - Including review of records relating to presenting problem    Occupational performance deficits (Please refer to evaluation for details): ADL's;IADL's    Body Structure / Function / Physical Skills ADL;FMC;IADL    Rehab Potential Good    Clinical Decision Making Several treatment options, min-mod task modification necessary    Comorbidities Affecting Occupational Performance: May have comorbidities impacting occupational performance    Modification or Assistance to Complete Evaluation  Min-Moderate modification of tasks or assist with assess necessary to complete eval    OT Frequency 1x / week    OT Duration 8 weeks    OT Treatment/Interventions Self-care/ADL training;Therapeutic exercise;DME and/or AE instruction;Patient/family  education;Therapeutic activities    Consulted and Agree with Plan of Care Patient             Patient will benefit from skilled therapeutic intervention in order to improve the following deficits and impairments:   Body Structure / Function / Physical Skills: ADL, [place], IADL       Visit Diagnosis: Muscle weakness (  generalized)  Other lack of coordination    Problem List Patient Active Problem List   Diagnosis Date Noted   Borderline personality disorder ([place]) 09/21/2020   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/2018   Dislocation of patellofemoral joint 10/13/2017   Labor and delivery indication for care or intervention 02/15/2017   Pregnancy 01/01/2017   Encounter for procreative genetic counseling 91/69/4503   Umbilical hernia without obstruction and without gangrene    Norovirus 06/24/2016   ADHD (attention deficit hyperactivity disorder) 06/20/2016   Anxiety 06/20/2016   Asthma without status asthmaticus 06/20/2016   Depression 06/20/2016   Fibrocystic breast disease 06/20/2016   Fibromyalgia 06/20/2016   Migraine 06/20/2016   Ovarian cyst 06/20/2016   Morbid obesity ([place]) 02/24/2014   Headache 07/26/2013   KNEE PAIN, BILATERAL 01/02/2010   TENDINITIS, PATELLAR 01/02/2010    Harrel Carina, MS, OTR/L 12/04/2020, 3:01 PM  [place] MAIN [facility] SERVICES [address] [place], [place], [address] Phone: [phone]   Fax:  [phone]  Name: [name] MRN: [id number] Date of Birth: October 08, 1980

## 2020-12-06 ENCOUNTER — Encounter: Payer: BC Managed Care – PPO | Admitting: Occupational Therapy

## 2021-01-19 ENCOUNTER — Telehealth: Payer: BC Managed Care – PPO | Admitting: Emergency Medicine

## 2021-01-19 DIAGNOSIS — U071 COVID-19: Secondary | ICD-10-CM | POA: Diagnosis not present

## 2021-01-19 NOTE — Progress Notes (Signed)
Virtual Visit Consent   [place], you are scheduled for a virtual visit with a [facility] provider today.     Just as with appointments in the office, your consent must be obtained to participate.  Your consent will be active for this visit and any virtual visit you Michael have with one of our providers in the next 365 days.     If you have a MyChart account, a copy of this consent can be sent to you electronically.  All virtual visits are billed to your insurance company just like a traditional visit in the office.    As this is a virtual visit, video technology does not allow for your provider to perform a traditional examination.  This Michael limit your provider's ability to fully assess your condition.  If your provider identifies any concerns that need to be evaluated in person or the need to arrange testing (such as labs, EKG, etc.), we will make arrangements to do so.     Although advances in technology are sophisticated, we cannot ensure that it will always work on either your end or our end.  If the connection with a video visit is poor, the visit Michael have to be switched to a telephone visit.  With either a video or telephone visit, we are not always able to ensure that we have a secure connection.     I need to obtain your verbal consent now.   Are you willing to proceed with your visit today?    [name] has provided verbal consent on 01/19/2021 for a virtual visit (video or telephone).   Michael Getting, NP   Date: 01/19/2021 3:59 PM   Virtual Visit via Video Note   I, [name], connected with  [name], [name], 1982) on 01/19/21 at  3:45 PM EST by a video-enabled telemedicine application and verified that I am speaking with the correct person using two identifiers.  Location: Patient: Virtual Visit Location Patient: Home Provider: Virtual Visit Location Provider: Home Office   I discussed the limitations of evaluation and management by telemedicine  and the availability of in person appointments. The patient expressed understanding and agreed to proceed.    History of Present Illness: [name] is a 40 y.o. who identifies as a transgender male / male-to-male who was assigned adult at birth, and is being seen today for potential rebound COVID after Paxil Oved.  Patient reports they had COVID last February and now have Driscoll.  They got COVID again last week and were prescribed Paxlovid.  After finishing paxlovid, symptoms returned today patient has a fever and is worried that they have rebound COVID.  They also had a bilateral mastectomy October 3 and had a drain removed a couple weeks later, and they are worried that they Michael be at risk for sepsis.  They have a pulse ox and heart rate monitor on their phone and tell me that their oxygenation is 99% and her heart rate is 93.  They do not report shortness of breath.  They do report dehydration and decreased urine output, fever that with Tylenol comes down to 100 F, headache, body aches.  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   Dislocation of patellofemoral joint 10/13/2017  Labor and delivery indication for care or intervention 02/15/2017   Pregnancy 01/01/2017   Encounter for procreative genetic counseling 76/28/3151   Umbilical hernia without obstruction and without gangrene    Norovirus 06/24/2016   ADHD (attention deficit hyperactivity disorder) 06/20/2016   Anxiety 06/20/2016   Asthma without status asthmaticus 06/20/2016   Depression 06/20/2016   Fibrocystic breast disease 06/20/2016   Fibromyalgia 06/20/2016   Migraine 06/20/2016   Ovarian cyst 06/20/2016   Morbid obesity ([place]) 02/24/2014   Headache 07/26/2013   KNEE PAIN, BILATERAL 01/02/2010   TENDINITIS,  PATELLAR 01/02/2010    Allergies:  Allergies  Allergen Reactions   Penicillins Anaphylaxis    Did it involve swelling of the face/tongue/throat, SOB, or low BP? Yes Did it involve sudden or severe rash/hives, skin peeling, or any reaction on the inside of your mouth or nose? Unknown Did you need to seek medical attention at a hospital or doctor's office? Yes When did it last happen?       If all above answers are "NO", Michael proceed with cephalosporin use.    Remdesivir Hives and Itching    Hives all over body and severe itching  Hives all over body and severe itching  Hives all over body and severe itching     Chlorhexidine Hives   Chlorhexidine Gluconate Hives   Clonazepam     Indifference to life attitude   Dexamethasone Hives   Doxycycline Nausea And Vomiting    Severe vomiting   Fluoxetine Other (See Comments)    Suicidal thoughts   Nsaids     No nsaids due to gastric surgery   Other     CHLORAHEXADINE   Tolmetin     Other reaction(s): Other (See Comments), Unknown Other reaction: No nsaids due to gastric surgery No nsaids due to gastric surgery Other reaction: No nsaids due to gastric surgery    Hydrocodone-Acetaminophen Hives and Rash   Tape Rash   Tuberculin Ppd Rash   Medications:  Current Outpatient Medications:    albuterol (VENTOLIN HFA) 108 (90 Base) MCG/ACT inhaler, Inhale 1-2 puffs into the lungs every 4 (four) hours as needed for shortness of breath or wheezing., Disp: , Rfl:    amphetamine-dextroamphetamine (ADDERALL) 15 MG tablet, Take 15 mg by mouth 3 (three) times daily., Disp: , Rfl:    B Complex-C (SUPER B COMPLEX/VITAMIN C PO), Take 1 tablet by mouth daily. , Disp: , Rfl:    BD DISP NEEDLES 25G X 5/8" MISC, SMARTSIG:Injection Once a Week, Disp: , Rfl:    busPIRone (BUSPAR) 15 MG tablet, Take 15 mg by mouth 4 (four) times daily., Disp: , Rfl:    Calcium Carbonate (CALCIUM 500 PO), Take 500 mg by mouth 4 (four) times daily. chewable, Disp: , Rfl:     cholecalciferol (VITAMIN D) 1000 units tablet, Take 1,000 Units by mouth daily., Disp: , Rfl:    dronabinol (MARINOL) 5 MG capsule, Take 5 mg by mouth 2 (two) times daily as needed (abdominal pain)., Disp: , Rfl:    escitalopram (LEXAPRO) 20 MG tablet, Take 20 mg by mouth daily. (Patient not taking: No sig reported), Disp: , Rfl:    Multiple Vitamins-Minerals (ADEK GUMMIES PLUS ZN PO), Take 1 tablet by mouth daily., Disp: , Rfl:    neomycin-polymyxin b-dexamethasone (MAXITROL) 3.5-10000-0.1 SUSP, Place 1 drop into both eyes daily as needed (dryness/inflammation)., Disp: , Rfl:    ondansetron (ZOFRAN-ODT) 4 MG disintegrating tablet, Take 4 mg by mouth every 8 (eight) hours as needed  for vomiting or nausea., Disp: , Rfl:    OVER THE COUNTER MEDICATION, Take 1 capsule by mouth daily. Lypoic acid, Disp: , Rfl:    testosterone cypionate (DEPOTESTOSTERONE CYPIONATE) 200 MG/ML injection, Inject 0.3 mg into the muscle every Saturday., Disp: , Rfl:    vitamin E 1000 UNIT capsule, Take 1,000 Units by mouth daily., Disp: , Rfl:    zinc gluconate 50 MG tablet, Take 50 mg by mouth in the morning and at bedtime., Disp: , Rfl:    zolmitriptan (ZOMIG-ZMT) 2.5 MG disintegrating tablet, Take 2.5 mg by mouth as needed for migraine., Disp: , Rfl:   Observations/Objective: Patient is well-developed, well-nourished in no acute distress.  Resting comfortably  at home.  Head is normocephalic, atraumatic.  No labored breathing.  Speech is clear and coherent with logical content.  Patient is alert and oriented at baseline.  Patient appears as if they do not feel well.  Assessment and Plan: 1. COVID Reviewed symptoms of sepsis with patient and what to look for in case that could be the underlying problem.  Reviewed reasons for seeking care in an emergency room.  Reviewed the need for supportive care measures until COVID symptoms resolve.  Follow Up Instructions: I discussed the assessment and treatment plan with the  patient. The patient was provided an opportunity to ask questions and all were answered. The patient agreed with the plan and demonstrated an understanding of the instructions.  A copy of instructions were sent to the patient via MyChart unless otherwise noted below.   The patient was advised to call back or seek an in-person evaluation if the symptoms worsen or if the condition fails to improve as anticipated.  Time:  I spent 12 minutes with the patient via telehealth technology discussing the above problems/concerns.    Michael Getting, NP

## 2021-01-19 NOTE — Patient Instructions (Signed)
[facility] [place], thank you for joining Carvel Getting, NP for today's virtual visit.  While this provider is not your primary care provider (PCP), if your PCP is located in our provider database this encounter information will be shared with them immediately following your visit.  Consent: (Patient) [name] provided verbal consent for this virtual visit at the beginning of the encounter.  Current Medications:  Current Outpatient Medications:    albuterol (VENTOLIN HFA) 108 (90 Base) MCG/ACT inhaler, Inhale 1-2 puffs into the lungs every 4 (four) hours as needed for shortness of breath or wheezing., Disp: , Rfl:    amphetamine-dextroamphetamine (ADDERALL) 15 MG tablet, Take 15 mg by mouth 3 (three) times daily., Disp: , Rfl:    B Complex-C (SUPER B COMPLEX/VITAMIN C PO), Take 1 tablet by mouth daily. , Disp: , Rfl:    BD DISP NEEDLES 25G X 5/8" MISC, SMARTSIG:Injection Once a Week, Disp: , Rfl:    busPIRone (BUSPAR) 15 MG tablet, Take 15 mg by mouth 4 (four) times daily., Disp: , Rfl:    Calcium Carbonate (CALCIUM 500 PO), Take 500 mg by mouth 4 (four) times daily. chewable, Disp: , Rfl:    cholecalciferol (VITAMIN D) 1000 units tablet, Take 1,000 Units by mouth daily., Disp: , Rfl:    dronabinol (MARINOL) 5 MG capsule, Take 5 mg by mouth 2 (two) times daily as needed (abdominal pain)., Disp: , Rfl:    escitalopram (LEXAPRO) 20 MG tablet, Take 20 mg by mouth daily. (Patient not taking: No sig reported), Disp: , Rfl:    Multiple Vitamins-Minerals (ADEK GUMMIES PLUS ZN PO), Take 1 tablet by mouth daily., Disp: , Rfl:    neomycin-polymyxin b-dexamethasone (MAXITROL) 3.5-10000-0.1 SUSP, Place 1 drop into both eyes daily as needed (dryness/inflammation)., Disp: , Rfl:    ondansetron (ZOFRAN-ODT) 4 MG disintegrating tablet, Take 4 mg by mouth every 8 (eight) hours as needed for vomiting or nausea., Disp: , Rfl:    OVER THE COUNTER MEDICATION, Take 1 capsule by mouth daily. Lypoic acid, Disp: , Rfl:     testosterone cypionate (DEPOTESTOSTERONE CYPIONATE) 200 MG/ML injection, Inject 0.3 mg into the muscle every Saturday., Disp: , Rfl:    vitamin E 1000 UNIT capsule, Take 1,000 Units by mouth daily., Disp: , Rfl:    zinc gluconate 50 MG tablet, Take 50 mg by mouth in the morning and at bedtime., Disp: , Rfl:    zolmitriptan (ZOMIG-ZMT) 2.5 MG disintegrating tablet, Take 2.5 mg by mouth as needed for migraine., Disp: , Rfl:    Medications ordered in this encounter:  No orders of the defined types were placed in this encounter.    *If you need refills on other medications prior to your next appointment, please contact your pharmacy*  Follow-Up: Call back or seek an in-person evaluation if the symptoms worsen or if the condition fails to improve as anticipated.  Other Instructions Work hard on hydrating yourself to see if that helps improve your symptoms and make sure to treat your symptoms with over-the-counter medicines.  There are not any other medicines I can offer you for COVID treatment.    Take a look at this sepsis handout attached to this and monitor yourself for symptoms.  If you truly have symptoms of sepsis, you will need to to go to the hospital and be seen in person.   If you have been instructed to have an in-person evaluation today at a local Urgent Care facility, please use the link below.  It will take you to a list of all of our available [facility], including address, phone number and hours of operation. Please do not delay care.  [facility]  If you or a family member do not have a primary care provider, use the link below to schedule a visit and establish care. When you choose a [facility] primary care physician or advanced practice provider, you gain a long-term partner in health. Find a Primary Care Provider  Learn more about [facility] in-office and virtual care options: [facility]

## 2021-02-14 ENCOUNTER — Other Ambulatory Visit: Payer: Self-pay | Admitting: Physician Assistant

## 2021-02-14 DIAGNOSIS — F649 Gender identity disorder, unspecified: Secondary | ICD-10-CM

## 2021-02-14 DIAGNOSIS — R4701 Aphasia: Secondary | ICD-10-CM

## 2021-02-14 DIAGNOSIS — R4182 Altered mental status, unspecified: Secondary | ICD-10-CM

## 2021-02-15 ENCOUNTER — Ambulatory Visit
Admission: RE | Admit: 2021-02-15 | Discharge: 2021-02-15 | Disposition: A | Discharge status: Home / Self Care | Payer: BC Managed Care – PPO | Source: Ambulatory Visit | Attending: Physician Assistant | Admitting: Physician Assistant

## 2021-02-15 DIAGNOSIS — R4182 Altered mental status, unspecified: Secondary | ICD-10-CM | POA: Insufficient documentation

## 2021-02-15 DIAGNOSIS — F649 Gender identity disorder, unspecified: Secondary | ICD-10-CM | POA: Insufficient documentation

## 2021-02-15 DIAGNOSIS — R4701 Aphasia: Secondary | ICD-10-CM | POA: Diagnosis present

## 2021-02-15 MED ORDER — GADOBUTROL 1 MMOL/ML IV SOLN
7.5000 mL | Freq: Once | INTRAVENOUS | Status: AC | PRN
  Administered 2021-02-15: 7.5 mL via INTRAVENOUS

## 2021-03-16 ENCOUNTER — Other Ambulatory Visit
Admission: RE | Admit: 2021-03-16 | Discharge: 2021-03-16 | Disposition: A | Discharge status: Home / Self Care | Payer: BC Managed Care – PPO | Source: Ambulatory Visit | Attending: Neurology | Admitting: Neurology

## 2021-03-16 DIAGNOSIS — G939 Disorder of brain, unspecified: Secondary | ICD-10-CM | POA: Diagnosis not present

## 2021-03-16 DIAGNOSIS — Z820 Family history of epilepsy and other diseases of the nervous system: Secondary | ICD-10-CM | POA: Diagnosis present

## 2021-03-16 LAB — APTT: aPTT: 34 seconds (ref 24–36)

## 2021-03-21 ENCOUNTER — Other Ambulatory Visit: Payer: Self-pay | Admitting: Neurology

## 2021-03-21 DIAGNOSIS — G939 Disorder of brain, unspecified: Secondary | ICD-10-CM

## 2021-03-22 ENCOUNTER — Other Ambulatory Visit: Payer: Self-pay | Admitting: Neurology

## 2021-03-22 DIAGNOSIS — Z820 Family history of epilepsy and other diseases of the nervous system: Secondary | ICD-10-CM

## 2021-03-29 ENCOUNTER — Ambulatory Visit: Admission: RE | Admit: 2021-03-29 | Payer: BC Managed Care – PPO | Source: Ambulatory Visit

## 2021-03-29 ENCOUNTER — Other Ambulatory Visit: Payer: Self-pay

## 2021-03-29 ENCOUNTER — Ambulatory Visit
Admission: RE | Admit: 2021-03-29 | Discharge: 2021-03-29 | Disposition: A | Discharge status: Home / Self Care | Payer: BC Managed Care – PPO | Source: Ambulatory Visit | Attending: Neurology | Admitting: Neurology

## 2021-03-29 DIAGNOSIS — Z820 Family history of epilepsy and other diseases of the nervous system: Secondary | ICD-10-CM | POA: Insufficient documentation

## 2021-03-29 DIAGNOSIS — Z13858 Encounter for screening for other nervous system disorders: Secondary | ICD-10-CM | POA: Insufficient documentation

## 2021-03-29 LAB — GLUCOSE, CSF: Glucose, CSF: 65 mg/dL (ref 40–70)

## 2021-03-29 LAB — PROTEIN, CSF: Total  Protein, CSF: 24 mg/dL (ref 15–45)

## 2021-03-29 LAB — CSF CELL COUNT WITH DIFFERENTIAL
Eosinophils, CSF: 0 %
Lymphs, CSF: 84 %
Monocyte-Macrophage-Spinal Fluid: 2 %
RBC Count, CSF: 1064 /mm3 — ABNORMAL HIGH (ref 0–3)
Segmented Neutrophils-CSF: 14 %
Tube #: 3
WBC, CSF: 8 /mm3 — ABNORMAL HIGH (ref 0–5)

## 2021-03-29 NOTE — Progress Notes (Incomplete)
Patient handed off to [employer] and Audiological scientist

## 2021-03-29 NOTE — Procedures (Signed)
Technically successful fluoro guided LP at L4-5 level with opening pressure of 17 cm H2O and closing pressure of 11 H2O.  13 cc of clear colorless CSF sent to lab for analysis.  No immediate post procedural complication.  Please see imaging section of Epic for full dictation.  Candiss Norse, PA-C

## 2021-03-29 NOTE — Discharge Instructions (Signed)
Lumbar Puncture, Care After Refer to this sheet in the next few weeks. These instructions provide you with information on caring for yourself after your procedure. Your health care provider may also give you more specific instructions. Your treatment has been planned according to current medical practices, but problems sometimes occur. Call your health care provider if you have any problems or questions after your procedure. What can I expect after the procedure? After your procedure, it is typical to have the following sensations: Mild discomfort or pain at the insertion site. Mild headache that is relieved with pain medicines.  Follow these instructions at home:  Avoid lifting anything heavier than 10 lb (4.5 kg) for at least 12 hours after the procedure. Drink enough fluids to keep your urine clear or pale yellow. Lay flat or as flat as possible for the remainder of the day. Contact a health care provider if: You have fever or chills. You have nausea or vomiting. You have a headache that lasts for more than 2 days. Get help right away if: You have any numbness or tingling in your legs. You are unable to control your bowel or bladder. You have bleeding or swelling in your back at the insertion site. You are dizzy or faint. This information is not intended to replace advice given to you by your health care provider. Make sure you discuss any questions you have with your health care provider. Document Released: 03/02/2013 Document Revised: 08/03/2015 Document Reviewed: 11/03/2012 Elsevier Interactive Patient Education  2017 [employer] Inc[employer] 

## 2021-03-30 ENCOUNTER — Ambulatory Visit: Admission: RE | Admit: 2021-03-30 | Payer: BC Managed Care – PPO | Source: Ambulatory Visit

## 2021-03-30 ENCOUNTER — Inpatient Hospital Stay (HOSPITAL_COMMUNITY)
Admission: EM | Admit: 2021-03-30 | Discharge: 2021-04-03 | DRG: 103 | Disposition: A | Discharge status: Home / Self Care | Payer: BC Managed Care – PPO | Attending: Internal Medicine | Admitting: Internal Medicine

## 2021-03-30 ENCOUNTER — Encounter (HOSPITAL_COMMUNITY): Payer: Self-pay | Admitting: Emergency Medicine

## 2021-03-30 ENCOUNTER — Ambulatory Visit: Payer: BC Managed Care – PPO

## 2021-03-30 DIAGNOSIS — G971 Other reaction to spinal and lumbar puncture: Secondary | ICD-10-CM | POA: Diagnosis not present

## 2021-03-30 DIAGNOSIS — E876 Hypokalemia: Secondary | ICD-10-CM | POA: Diagnosis present

## 2021-03-30 DIAGNOSIS — Y844 Aspiration of fluid as the cause of abnormal reaction of the patient, or of later complication, without mention of misadventure at the time of the procedure: Secondary | ICD-10-CM | POA: Diagnosis present

## 2021-03-30 DIAGNOSIS — Z91048 Other nonmedicinal substance allergy status: Secondary | ICD-10-CM

## 2021-03-30 DIAGNOSIS — N6019 Diffuse cystic mastopathy of unspecified breast: Secondary | ICD-10-CM | POA: Diagnosis present

## 2021-03-30 DIAGNOSIS — Z87892 Personal history of anaphylaxis: Secondary | ICD-10-CM

## 2021-03-30 DIAGNOSIS — Z9010 Acquired absence of unspecified breast and nipple: Secondary | ICD-10-CM

## 2021-03-30 DIAGNOSIS — F640 Transsexualism: Secondary | ICD-10-CM | POA: Diagnosis present

## 2021-03-30 DIAGNOSIS — Z79899 Other long term (current) drug therapy: Secondary | ICD-10-CM

## 2021-03-30 DIAGNOSIS — G379 Demyelinating disease of central nervous system, unspecified: Secondary | ICD-10-CM | POA: Diagnosis present

## 2021-03-30 DIAGNOSIS — F909 Attention-deficit hyperactivity disorder, unspecified type: Secondary | ICD-10-CM | POA: Diagnosis present

## 2021-03-30 DIAGNOSIS — Z887 Allergy status to serum and vaccine status: Secondary | ICD-10-CM

## 2021-03-30 DIAGNOSIS — Z9884 Bariatric surgery status: Secondary | ICD-10-CM

## 2021-03-30 DIAGNOSIS — Z880 Allergy status to penicillin: Secondary | ICD-10-CM

## 2021-03-30 DIAGNOSIS — J45909 Unspecified asthma, uncomplicated: Secondary | ICD-10-CM | POA: Diagnosis present

## 2021-03-30 DIAGNOSIS — M797 Fibromyalgia: Secondary | ICD-10-CM | POA: Diagnosis present

## 2021-03-30 DIAGNOSIS — F419 Anxiety disorder, unspecified: Secondary | ICD-10-CM | POA: Diagnosis present

## 2021-03-30 DIAGNOSIS — Z888 Allergy status to other drugs, medicaments and biological substances status: Secondary | ICD-10-CM

## 2021-03-30 DIAGNOSIS — Z885 Allergy status to narcotic agent status: Secondary | ICD-10-CM

## 2021-03-30 LAB — COMPREHENSIVE METABOLIC PANEL
ALT: 22 U/L (ref 0–44)
AST: 19 U/L (ref 15–41)
Albumin: 3.5 g/dL (ref 3.5–5.0)
Alkaline Phosphatase: 80 U/L (ref 38–126)
Anion gap: 7 (ref 5–15)
BUN: <5 mg/dL — ABNORMAL LOW (ref 6–20)
CO2: 27 mmol/L (ref 22–32)
Calcium: 8.4 mg/dL — ABNORMAL LOW (ref 8.9–10.3)
Chloride: 102 mmol/L (ref 98–111)
Creatinine, Ser: 0.99 mg/dL (ref 0.61–1.24)
GFR, Estimated: >60 mL/min (ref >60)
Glucose, Bld: 142 mg/dL — ABNORMAL HIGH (ref 70–99)
Potassium: 3.2 mmol/L — ABNORMAL LOW (ref 3.5–5.1)
Sodium: 136 mmol/L (ref 135–145)
Total Bilirubin: 0.3 mg/dL (ref 0.3–1.2)
Total Protein: 6.5 g/dL (ref 6.5–8.1)

## 2021-03-30 LAB — CBC WITH DIFFERENTIAL/PLATELET
Abs Immature Granulocytes: 0.01 10*3/uL (ref 0.00–0.07)
Basophils Absolute: 0 10*3/uL (ref 0.0–0.1)
Basophils Relative: 1 %
Eosinophils Absolute: 0.1 10*3/uL (ref 0.0–0.5)
Eosinophils Relative: 1 %
HCT: 40.8 % (ref 39.0–52.0)
Hemoglobin: 12.9 g/dL — ABNORMAL LOW (ref 13.0–17.0)
Immature Granulocytes: 0 %
Lymphocytes Relative: 33 %
Lymphs Abs: 1.6 10*3/uL (ref 0.7–4.0)
MCH: 26.7 pg (ref 26.0–34.0)
MCHC: 31.6 g/dL (ref 30.0–36.0)
MCV: 84.3 fL (ref 80.0–100.0)
Monocytes Absolute: 0.4 10*3/uL (ref 0.1–1.0)
Monocytes Relative: 8 %
Neutro Abs: 2.7 10*3/uL (ref 1.7–7.7)
Neutrophils Relative %: 57 %
Platelets: 289 10*3/uL (ref 150–400)
RBC: 4.84 MIL/uL (ref 4.22–5.81)
RDW: 13.8 % (ref 11.5–15.5)
WBC: 4.8 10*3/uL (ref 4.0–10.5)
nRBC: 0 % (ref 0.0–0.2)

## 2021-03-30 LAB — ANGIOTENSIN CONVERTING ENZYME, CSF: Angio Convert Enzyme: <1.5 U/L (ref 0.0–2.5)

## 2021-03-30 MED ORDER — SODIUM CHLORIDE 0.9 % IV BOLUS: 1000.0000 mL | Freq: Once | INTRAVENOUS | Status: DC

## 2021-03-30 NOTE — ED Triage Notes (Signed)
Pt reports having a LP yesterday at [facility] per neurologist. Endorses neck and back pain since yesterday. Pt able to walk but pain worse when standing up. Pt was scheduled for MRI at [facility] today but did not go due to pain.

## 2021-03-30 NOTE — ED Provider Triage Note (Signed)
Emergency Medicine Provider Triage Evaluation Note  [name] , a 41 y.o. adult  was evaluated in triage.  Pt complains of a headache and back pain after having LP for evaluation of MS  Review of Systems  Positive: headache Negative: fever  Physical Exam  BP (!) 136/102 (BP Location: Left Arm)    Pulse 80    Temp 98.6 F (37 C) (Oral)    Resp (!) 26    SpO2 100%  Gen:   Awake, uncomfortable Resp:  Normal effort  MSK:   Moves extremities without difficulty  Other:    Medical Decision Making  Medically screening exam initiated at 7:14 PM.  Appropriate orders placed.  [name] was informed that the remainder of the evaluation will be completed by another provider, this initial triage assessment does not replace that evaluation, and the importance of remaining in the ED until their evaluation is complete.     Fransico Meadow, [place] 03/30/21 [id number]

## 2021-03-31 DIAGNOSIS — G971 Other reaction to spinal and lumbar puncture: Secondary | ICD-10-CM | POA: Diagnosis present

## 2021-03-31 MED ORDER — PROMETHAZINE HCL 25 MG PO TABS
12.5000 mg | ORAL_TABLET | Freq: Four times a day (QID) | ORAL | Status: DC | PRN
  Administered 2021-04-01 – 2021-04-02 (×3): 12.5 mg via ORAL
  Filled 2021-03-31 (×3): qty 1

## 2021-03-31 MED ORDER — RIVAROXABAN 10 MG PO TABS
10.0000 mg | ORAL_TABLET | Freq: Every day | ORAL | Status: DC
  Administered 2021-04-01: 10 mg via ORAL
  Filled 2021-03-31 (×2): qty 1

## 2021-03-31 MED ORDER — LACTATED RINGERS IV SOLN: INTRAVENOUS | Status: AC

## 2021-03-31 MED ORDER — ESCITALOPRAM OXALATE 20 MG PO TABS
20.0000 mg | ORAL_TABLET | Freq: Every day | ORAL | Status: DC
  Filled 2021-03-31: qty 2
  Filled 2021-03-31 (×3): qty 1

## 2021-03-31 MED ORDER — BUTALBITAL-APAP-CAFFEINE 50-325-40 MG PO TABS
1.0000 | ORAL_TABLET | Freq: Once | ORAL | Status: AC
  Administered 2021-03-31: 1 via ORAL
  Filled 2021-03-31: qty 1

## 2021-03-31 MED ORDER — METOCLOPRAMIDE HCL 5 MG/ML IJ SOLN
10.0000 mg | Freq: Once | INTRAMUSCULAR | Status: AC
  Administered 2021-03-31: 10 mg via INTRAVENOUS
  Filled 2021-03-31: qty 2

## 2021-03-31 MED ORDER — BUSPIRONE HCL 10 MG PO TABS
15.0000 mg | ORAL_TABLET | Freq: Once | ORAL | Status: AC
  Administered 2021-03-31: 15 mg via ORAL
  Filled 2021-03-31: qty 2

## 2021-03-31 MED ORDER — TESTOSTERONE CYPIONATE 200 MG/ML IM SOLN
60.0000 mg | INTRAMUSCULAR | Status: DC
  Administered 2021-03-31: 60 mg via SUBCUTANEOUS
  Filled 2021-03-31: qty 0.3

## 2021-03-31 MED ORDER — FLUVOXAMINE MALEATE 50 MG PO TABS
50.0000 mg | ORAL_TABLET | Freq: Two times a day (BID) | ORAL | Status: DC
  Filled 2021-03-31 (×2): qty 1

## 2021-03-31 MED ORDER — AMPHETAMINE-DEXTROAMPHETAMINE 10 MG PO TABS
20.0000 mg | ORAL_TABLET | Freq: Three times a day (TID) | ORAL | Status: DC
  Administered 2021-03-31 – 2021-04-03 (×8): 20 mg via ORAL
  Filled 2021-03-31 (×10): qty 2

## 2021-03-31 MED ORDER — BUTALBITAL-APAP-CAFFEINE 50-325-40 MG PO TABS
1.0000 | ORAL_TABLET | Freq: Four times a day (QID) | ORAL | Status: DC | PRN
  Administered 2021-03-31 – 2021-04-03 (×9): 1 via ORAL
  Filled 2021-03-31 (×9): qty 1

## 2021-03-31 MED ORDER — FLUVOXAMINE MALEATE 50 MG PO TABS
25.0000 mg | ORAL_TABLET | Freq: Two times a day (BID) | ORAL | Status: DC
  Filled 2021-03-31 (×2): qty 1

## 2021-03-31 MED ORDER — PANCRELIPASE (LIP-PROT-AMYL) 36000-114000 UNITS PO CPEP
72000.0000 [IU] | ORAL_CAPSULE | Freq: Three times a day (TID) | ORAL | Status: DC
  Administered 2021-03-31 – 2021-04-03 (×8): 72000 [IU] via ORAL
  Filled 2021-03-31 (×10): qty 2

## 2021-03-31 MED ORDER — FLUVOXAMINE MALEATE 50 MG PO TABS
75.0000 mg | ORAL_TABLET | Freq: Two times a day (BID) | ORAL | Status: DC
  Administered 2021-03-31 – 2021-04-03 (×7): 75 mg via ORAL
  Filled 2021-03-31 (×9): qty 2

## 2021-03-31 MED ORDER — BUSPIRONE HCL 15 MG PO TABS
15.0000 mg | ORAL_TABLET | Freq: Four times a day (QID) | ORAL | Status: DC
  Administered 2021-04-01 – 2021-04-03 (×10): 15 mg via ORAL
  Filled 2021-03-31 (×10): qty 1

## 2021-03-31 MED ORDER — OXYCODONE-ACETAMINOPHEN 5-325 MG PO TABS
1.0000 | ORAL_TABLET | Freq: Three times a day (TID) | ORAL | 0 refills | Status: DC | PRN
Start: 2021-03-31 — End: 2021-07-01

## 2021-03-31 MED ORDER — LACTATED RINGERS IV BOLUS
2000.0000 mL | Freq: Once | INTRAVENOUS | Status: AC
  Administered 2021-03-31: 2000 mL via INTRAVENOUS

## 2021-03-31 MED ORDER — DIPHENHYDRAMINE HCL 50 MG/ML IJ SOLN
25.0000 mg | Freq: Once | INTRAMUSCULAR | Status: AC
  Administered 2021-03-31: 25 mg via INTRAVENOUS
  Filled 2021-03-31: qty 1

## 2021-03-31 MED ORDER — ALPRAZOLAM 0.25 MG PO TABS
0.5000 mg | ORAL_TABLET | Freq: Once | ORAL | Status: AC
  Administered 2021-03-31: 0.5 mg via ORAL
  Filled 2021-03-31: qty 2

## 2021-03-31 MED ORDER — PANTOPRAZOLE SODIUM 40 MG PO TBEC
40.0000 mg | DELAYED_RELEASE_TABLET | Freq: Every day | ORAL | Status: DC
  Administered 2021-04-01 – 2021-04-03 (×3): 40 mg via ORAL
  Filled 2021-03-31 (×4): qty 1

## 2021-03-31 MED ORDER — METOPROLOL SUCCINATE ER 25 MG PO TB24
25.0000 mg | ORAL_TABLET | Freq: Every day | ORAL | Status: DC
  Administered 2021-04-01 – 2021-04-03 (×3): 25 mg via ORAL
  Filled 2021-03-31 (×4): qty 1

## 2021-03-31 MED ORDER — SUMATRIPTAN SUCCINATE 25 MG PO TABS
25.0000 mg | ORAL_TABLET | Freq: Every day | ORAL | Status: DC | PRN
  Administered 2021-03-31 – 2021-04-03 (×4): 25 mg via ORAL
  Filled 2021-03-31 (×5): qty 1

## 2021-03-31 MED ORDER — ONDANSETRON HCL 4 MG/2ML IJ SOLN
4.0000 mg | Freq: Four times a day (QID) | INTRAMUSCULAR | Status: DC | PRN
  Administered 2021-04-01 – 2021-04-02 (×2): 4 mg via INTRAVENOUS
  Filled 2021-03-31: qty 2

## 2021-03-31 MED ORDER — POTASSIUM CHLORIDE 20 MEQ PO PACK
40.0000 meq | PACK | Freq: Two times a day (BID) | ORAL | Status: AC
  Administered 2021-03-31 (×2): 40 meq via ORAL
  Filled 2021-03-31 (×2): qty 2

## 2021-03-31 MED ORDER — ALPRAZOLAM 0.5 MG PO TABS
0.5000 mg | ORAL_TABLET | Freq: Every day | ORAL | Status: DC | PRN
  Administered 2021-04-01 – 2021-04-02 (×2): 0.5 mg via ORAL
  Filled 2021-03-31 (×4): qty 1

## 2021-03-31 MED ORDER — ALBUTEROL SULFATE (2.5 MG/3ML) 0.083% IN NEBU: 2.5000 mg | INHALATION_SOLUTION | RESPIRATORY_TRACT | Status: DC | PRN

## 2021-03-31 NOTE — ED Notes (Signed)
Lunch tray ordered 

## 2021-03-31 NOTE — Discharge Instructions (Signed)
Call the [facility] imaging spine center at [phone] or [phone] on Monday morning if you still have the headache.  They should be able to get you in and potentially place an order if needed.  Return to the ER for worsening symptoms.

## 2021-03-31 NOTE — H&P (Signed)
Date: 03/31/2021               Patient Name:  [name] MRN: [id number]  DOB: 1980-05-20 Age / Sex: 41 y.o., adult   PCP: Marinda Elk, MD         Medical Service: Internal Medicine Teaching Service         Attending Physician: Dr. Sid Falcon, MD    First Contact: Christiana Fuchs, DO Pager: KM [phone]  Second Contact: Gaylan Gerold, DO Pager: Liliane Shi [phone]       After Hours (After 5p/  First Contact Pager: [phone]  weekends / holidays): Second Contact Pager: [phone]   SUBJECTIVE  Chief Complaint: Headache  History of Present Illness: [name] is a 41 y.o. transgender male with a pertinent PMH of anxiety, transgender s/p mastectomy, gastric sleeve surgery, ADHD, long COVID syndrome, who presents to [facility] with headache.  Patient had an MRI in December 08 for expressive aphasia, left-sided weakness, and acute encephalopathy, which show a single focus of FLAIR signal abnormality in the right frontal lobe suspicious for demyelinating lesion.  Patient reports family history of MS in paternal first cousin.  He followed-up with neurology after and was ordered MRI of C-spine and T-spine.  He also had a lumbar puncture on 1/19 which collected 13 cc of CSF.  Patient reports severe headache the day after his lumbar puncture.  Report pains in the front of the head, his spine and also lower back.  Pain is worse with movement standing up; and better with rest.  He denies any weakness of the upper extremity or lower extremities.  Report pain is currently 6/10.  Also endorses nausea, vomiting and dizziness.  In the ED, the patient was given Benadryl, Reglan, Fioricet without much relief.  Also received 2 L fluid bolus.  Epidural blood patch is not available on the weekend.  Patient is admitted for pain control.  Medications: No current facility-administered medications on file prior to encounter.   Current Outpatient Medications on File Prior to Encounter  Medication Sig Dispense Refill    albuterol (VENTOLIN HFA) 108 (90 Base) MCG/ACT inhaler Inhale 1-2 puffs into the lungs every 4 (four) hours as needed for shortness of breath or wheezing.     ALPRAZolam (XANAX) 0.5 MG tablet Take 0.5 mg by mouth daily as needed for anxiety.     amphetamine-dextroamphetamine (ADDERALL) 20 MG tablet Take 20 mg by mouth 3 (three) times daily.     B Complex-C (SUPER B COMPLEX/VITAMIN C PO) Take 1 tablet by mouth daily.      busPIRone (BUSPAR) 15 MG tablet Take 15 mg by mouth 4 (four) times daily.     Calcium Carbonate (CALCIUM 500 PO) Take 500 mg by mouth 4 (four) times daily. chewable     CREON 36000-114000 units CPEP capsule Take 72,000 Units by mouth 3 (three) times daily with meals.     dronabinol (MARINOL) 5 MG capsule Take 5 mg by mouth 2 (two) times daily as needed (abdominal pain).     fluvoxaMINE (LUVOX) 25 MG tablet Take 25 mg by mouth 2 (two) times daily. Takes along with 50 mg to total 75 mg     fluvoxaMINE (LUVOX) 50 MG tablet Take 50 mg by mouth 2 (two) times daily. Takes along with 25 to total 75 mg     metoprolol succinate (TOPROL-XL) 25 MG 24 hr tablet Take 25 mg by mouth daily.     Multiple Vitamins-Minerals (ADEK GUMMIES PLUS  ZN PO) Take 1 tablet by mouth daily.     ondansetron (ZOFRAN-ODT) 4 MG disintegrating tablet Take 4 mg by mouth every 8 (eight) hours as needed for vomiting or nausea.     OVER THE COUNTER MEDICATION Take 1 capsule by mouth daily. Lypoic acid     testosterone cypionate (DEPOTESTOSTERONE CYPIONATE) 200 MG/ML injection Inject 0.3 mg into the muscle every Saturday.     vitamin E 1000 UNIT capsule Take 1,000 Units by mouth daily.     zinc gluconate 50 MG tablet Take 50 mg by mouth in the morning and at bedtime.     zolmitriptan (ZOMIG-ZMT) 2.5 MG disintegrating tablet Take 2.5 mg by mouth daily as needed for migraine.     BD DISP NEEDLES 25G X 5/8" MISC SMARTSIG:Injection Once a Week     ergocalciferol (VITAMIN D2) 1.25 MG (50000 UT) capsule Take 1 capsule by  mouth once a week. (Patient not taking: Reported on 03/31/2021)     escitalopram (LEXAPRO) 20 MG tablet Take 20 mg by mouth daily. (Patient not taking: Reported on 10/30/2020)     neomycin-polymyxin b-dexamethasone (MAXITROL) 3.5-10000-0.1 SUSP Place 1 drop into both eyes daily as needed (dryness/inflammation). (Patient not taking: Reported on 03/31/2021)      Past Medical History:  Past Medical History:  Diagnosis Date   Asthma    Breast cyst    Breast mass    Depression    Fibrocystic breast    Fibromyalgia    Migraines     Social:  Lives - in [place] -partner, wife and children Level of function -independent PCP - Wilburt Finlay, NP at [facility]  Substance use - No smoking. 1 drink a month. No drug use.   Family History: Family History  Problem Relation Age of Onset   Breast cancer Paternal 50    Breast cancer Paternal 75    Healthy Mother    CVA Father    Hypertension Father    Diabetes Father    Breast cancer Paternal Grandmother     Allergies: Allergies as of 03/30/2021 - Review Complete 03/30/2021  Allergen Reaction Noted   Penicillins Anaphylaxis 12/09/2013   Remdesivir Hives and Itching 06/12/2020   Chlorhexidine Hives 11/06/2016   Chlorhexidine gluconate Hives 11/06/2016   Clonazepam  02/17/2014   Dexamethasone Hives 02/05/2015   Doxycycline Nausea And Vomiting 12/09/2013   Fluoxetine Other (See Comments) 02/17/2014   Nsaids  05/14/2015   Other  05/13/2015   Tolmetin  05/14/2015   Hydrocodone-acetaminophen Hives and Rash 02/17/2014   Tape Rash 02/17/2014   Tuberculin ppd Rash 02/17/2014    Review of Systems: A complete ROS was negative except as per HPI.   OBJECTIVE:  Physical Exam: Blood pressure 120/83, pulse 83, temperature 98.8 F (37.1 C), temperature source Oral, resp. rate 15, SpO2 100 %, currently breastfeeding. Constitutional: well-appearing, laying in bed, covering face due to pain HENT: normocephalic  atraumatic, mucous membranes moist Eyes: conjunctiva non-erythematous Neck: supple Cardiovascular: regular rate and rhythm, no m/r/g Pulmonary/Chest: normal work of breathing on room air, lungs clear to auscultation bilaterally Abdominal: soft, non-tender, non-distended MSK: normal bulk and tone Neurological: alert & oriented x 3, 5/5 strength in bilateral upper and lower extremities Neurological:  Cranial Nerves: II: Visual Fields are full. Pupils are equal, round, and reactive to light.   III,IV, VI: EOMI without ptosis or diploplia.  V: Facial sensation is symmetric to light touch VII: Facial movement is symmetric.  VIII: hearing is intact to  voice X: Uvula elevates symmetrically XI: Shoulder shrug is symmetric. XII: tongue is midline without atrophy or fasciculations.  Motor: Tone is normal. Bulk is normal. 5/5 strength was present in all four extremities.  Sensory: Sensation is symmetric to light touch in the arms and legs. Skin: warm and dry Psych: normal mood and affect   Pertinent Labs: CBC    Component Value Date/Time   WBC 4.8 03/30/2021 1934   RBC 4.84 03/30/2021 1934   HGB 12.9 (L) 03/30/2021 1934   HGB 14.3 01/08/2014 1417   HCT 40.8 03/30/2021 1934   HCT 43.3 01/08/2014 1417   PLT 289 03/30/2021 1934   PLT 334 01/08/2014 1417   MCV 84.3 03/30/2021 1934   MCV 87 01/08/2014 1417   MCH 26.7 03/30/2021 1934   MCHC 31.6 03/30/2021 1934   RDW 13.8 03/30/2021 1934   RDW 13.5 01/08/2014 1417   LYMPHSABS 1.6 03/30/2021 1934   LYMPHSABS 1.4 01/08/2014 1417   MONOABS 0.4 03/30/2021 1934   MONOABS 0.8 01/08/2014 1417   EOSABS 0.1 03/30/2021 1934   EOSABS 0.0 01/08/2014 1417   BASOSABS 0.0 03/30/2021 1934   BASOSABS 0.0 01/08/2014 1417     CMP     Component Value Date/Time   NA 136 03/30/2021 1934   NA 139 01/26/2014 0047   K 3.2 (L) 03/30/2021 1934   K 4.0 01/26/2014 0047   CL 102 03/30/2021 1934   CL 104 01/26/2014 0047   CO2 27 03/30/2021 1934   CO2  29 01/26/2014 0047   GLUCOSE 142 (H) 03/30/2021 1934   GLUCOSE 108 (H) 01/26/2014 0047   BUN <5 (L) 03/30/2021 1934   BUN 11 01/26/2014 0047   CREATININE 0.99 03/30/2021 1934   CREATININE 0.90 01/26/2014 0047   CALCIUM 8.4 (L) 03/30/2021 1934   CALCIUM 8.2 (L) 01/26/2014 0047   PROT 6.5 03/30/2021 1934   PROT 8.0 01/08/2014 1417   ALBUMIN 3.5 03/30/2021 1934   ALBUMIN 3.5 01/08/2014 1417   AST 19 03/30/2021 1934   AST 16 01/08/2014 1417   ALT 22 03/30/2021 1934   ALT 20 01/08/2014 1417   ALKPHOS 80 03/30/2021 1934   ALKPHOS 102 01/08/2014 1417   BILITOT 0.3 03/30/2021 1934   BILITOT 0.3 01/08/2014 1417   GFRNONAA >60 03/30/2021 1934   GFRNONAA >60 01/26/2014 0047   GFRNONAA >60 05/04/2013 1454   GFRAA >60 10/21/2019 2334   GFRAA >60 01/26/2014 0047   GFRAA >60 05/04/2013 1454    Pertinent Imaging: No results found.  ASSESSMENT & PLAN:  Assessment: Principal Problem:   Post-dural puncture headache Active Problems:   ADHD (attention deficit hyperactivity disorder)   Anxiety   [name] is a 41 y.o. transgender male with a pertinent PMH of anxiety, transgender s/p mastectomy, ADHD, long COVID syndrome, gastric sleeve surgery, who was admitted for post lumbar puncture headache.  Plan: #Post lumbar puncture headache Patient's headache developed 1 day after lumbar puncture.  He has classic symptoms of post LP headache including worse with standing up and movement and better with lying down.  He has no neurological deficits on physical exam, no indication for repeating imaging.  Unfortunately epidural blood patch is not available on the weekend. -Will treat with acetaminophen-butalbital-caffeine Q6h PRN -Will avoid opioids if possible  #Possible demyelinating disease Reports family history of MS. CSF studies show normal protein.  Pending oligoclonal and IgG study.  Patient has not had C-spine and T-spine MRI. -Follow-up with neurology outpatient as  scheduled  #Hypokalemia  Patient has intermittent hypokalemic episodes per chart review.  He is not on diuretics or medication that can lower potassium. -Replete -Check magnesium  #Anxiety #ADHD -Resume Xanax, Adderall and fluvoxamine  #Transgender surgery -Testosterone injection every Saturday  Best Practice: Diet: Regular diet IVF: NA VTE: rivaroxaban (XARELTO) tablet 10 mg Start: 03/31/21 1315 Code: Full AB: NA Status: Observation with expected length of stay less than 2 midnights. Anticipated Discharge Location: Home Barriers to Discharge: Medical stability  Signature: Daleen Bo. Linton Stolp, D.O.  Internal Medicine Resident, PGY-1 [facility]  Pager: [phone] 2:25 PM, 03/31/2021   Please contact the on call pager after 5 pm and on weekends at [phone].

## 2021-03-31 NOTE — ED Notes (Signed)
Pt given discharge paperwork. Wife at bedside does not feel comfortable taking pt home due to pt not eating. EDP made aware.

## 2021-03-31 NOTE — ED Provider Notes (Signed)
°  Physical Exam  BP 114/71    Pulse 81    Temp 99.1 F (37.3 C)    Resp 12    SpO2 98%   Physical Exam  Procedures  Procedures  ED Course / MDM   Clinical Course as of 03/31/21 1137  Sat Mar 31, 2021  0721 Signed out to Dr Alvino Chapel to re-evaluate patient.  If patient can ambulate he can be discharged home [DW]    Clinical Course User Index [DW] Ripley Fraise, MD   Medical Decision Making Amount and/or Complexity of Data Reviewed Labs: ordered.  Risk Prescription drug management.   Patient with signout from Dr. Christy Gentles for spinal headache after lumbar puncture.  Continued pain is back and headache.  Sounds like a spinal headache.  Discussed with Dr. Kathlene Cote.  Cannot get blood patch done this weekend.  Plan for outpatient that he has helped arrange some follow-up, however patient cannot tolerate the pain at this time.  States still severe pain that he cannot manage.  Headache is worse with ambulating.  Despite intensive treatment continued pain.  Will discuss with unassigned medicine for admission.  Potentially also could have more work-up for the back pain post lumbar puncture       Davonna Belling, MD 03/31/21 1138

## 2021-03-31 NOTE — ED Provider Notes (Signed)
[facility] EMERGENCY DEPARTMENT Provider Note   CSN: [id number] Arrival date & time: 03/30/21  1842     History  Chief Complaint  Patient presents with   Back Pain    [name] is a 41 y.o. adult.  The history is provided by the patient and the spouse.  Patient presents with back pain, headache and nausea.  Patient reports that he has been undergoing a work-up for multiple sclerosis.  He underwent a lumbar puncture on January 19 without any complications.  The following day he began having diffuse back pain and headache and nausea upon standing.  He has tried taking p.o. fluids and caffeine without any improvement.  No fevers or vomiting.  No focal weakness.  No previous back surgery.  Patient reports that his neurologist recommended a possible blood patch He was was to have an MRI of the spine yesterday but was unable to go due to pain    Home Medications Prior to Admission medications   Medication Sig Start Date End Date Taking? Authorizing Provider  albuterol (VENTOLIN HFA) 108 (90 Base) MCG/ACT inhaler Inhale 1-2 puffs into the lungs every 4 (four) hours as needed for shortness of breath or wheezing.    [provider]  amphetamine-dextroamphetamine (ADDERALL) 15 MG tablet Take 15 mg by mouth 3 (three) times daily. 10/09/20   [provider]  B Complex-C (SUPER B COMPLEX/VITAMIN C PO) Take 1 tablet by mouth daily.     [provider]  BD DISP NEEDLES 25G X 5/8" MISC [place] Once a Week 09/29/19   [provider]  busPIRone (BUSPAR) 15 MG tablet Take 15 mg by mouth 4 (four) times daily. 10/09/20   [provider]  Calcium Carbonate (CALCIUM 500 PO) Take 500 mg by mouth 4 (four) times daily. chewable    [provider]  cholecalciferol (VITAMIN D) 1000 units tablet Take 1,000 Units by mouth daily.    [provider]  dronabinol (MARINOL) 5 MG capsule Take 5 mg by mouth 2 (two) times daily as  needed (abdominal pain). 06/24/19   [provider]  escitalopram (LEXAPRO) 20 MG tablet Take 20 mg by mouth daily. Patient not taking: No sig reported 10/09/20   [provider]  Multiple Vitamins-Minerals (ADEK GUMMIES PLUS ZN PO) Take 1 tablet by mouth daily.    [provider]  neomycin-polymyxin b-dexamethasone (MAXITROL) 3.5-10000-0.1 SUSP Place 1 drop into both eyes daily as needed (dryness/inflammation).    [provider]  ondansetron (ZOFRAN-ODT) 4 MG disintegrating tablet Take 4 mg by mouth every 8 (eight) hours as needed for vomiting or nausea. 04/10/19   [provider]  OVER THE COUNTER MEDICATION Take 1 capsule by mouth daily. Lypoic acid    [provider]  testosterone cypionate (DEPOTESTOSTERONE CYPIONATE) 200 MG/ML injection Inject 0.3 mg into the muscle every Saturday. 09/29/19   [provider]  vitamin E 1000 UNIT capsule Take 1,000 Units by mouth daily.    [provider]  zinc gluconate 50 MG tablet Take 50 mg by mouth in the morning and at bedtime.    [provider]  zolmitriptan (ZOMIG-ZMT) 2.5 MG disintegrating tablet Take 2.5 mg by mouth as needed for migraine. 03/24/19   [provider]      Allergies    Penicillins, Remdesivir, Chlorhexidine, Chlorhexidine gluconate, Clonazepam, Dexamethasone, Doxycycline, Fluoxetine, Nsaids, Other, Tolmetin, Hydrocodone-acetaminophen, Tape, and Tuberculin ppd    Review of Systems   Review of Systems  Constitutional:  Negative for fever.  Gastrointestinal:  Positive for nausea. Negative for vomiting.  Musculoskeletal:  Positive for back pain.  Neurological:  Negative for weakness and numbness.  All other systems reviewed and are negative.  Physical Exam Updated Vital Signs BP 117/83    Pulse 82    Temp 98.6 F (37 C) (Oral)    Resp 17    SpO2 98%  Physical Exam CONSTITUTIONAL: Well developed/well nourished, uncomfortable appearing HEAD:  Normocephalic/atraumatic EYES: EOMI/PERRL, no nystagmus,  no ptosis ENMT: Mucous membranes moist NECK: supple no meningeal signs Spine - tenderness over lumbar spine at site of LP, no erythema or abscess noted CV: S1/S2 noted, no murmurs/rubs/gallops noted LUNGS: Lungs are clear to auscultation bilaterally, no apparent distress ABDOMEN: soft, nontender, no rebound or guarding GU:no cva tenderness NEURO:Awake/alert, face symmetric, no arm or leg drift is noted Equal 5/5 strength with shoulder abduction, elbow flex/extension, wrist flex/extension in upper extremities  Equal 5/5 strength with hip flexion,knee flex/extension, foot dorsi/plantar flexion Cranial nerves 3/4/5/6/09/16/08/11/12 tested and intact Sensation to light touch intact in all extremities EXTREMITIES: pulses normal, full ROM SKIN: warm, color normal PSYCH: no abnormalities of mood noted  ED Results / Procedures / Treatments   Labs (all labs ordered are listed, but only abnormal results are displayed) Labs Reviewed  CBC WITH DIFFERENTIAL/PLATELET - Abnormal; Notable for the following components:      Result Value   Hemoglobin 12.9 (*)    All other components within normal limits  COMPREHENSIVE METABOLIC PANEL - Abnormal; Notable for the following components:   Potassium 3.2 (*)    Glucose, Bld 142 (*)    BUN <5 (*)    Calcium 8.4 (*)    All other components within normal limits    EKG None  Radiology DG FLUORO GUIDED LOC OF NEEDLE/CATH TIP FOR SPINAL INJECT LT  Result Date: 03/29/2021 CLINICAL DATA:  Provided history: family history of multiple sclerosis. Additional history provided: new neurologic symptoms status post COVID infection. EXAM: DIAGNOSTIC LUMBAR PUNCTURE UNDER FLUOROSCOPIC GUIDANCE COMPARISON:  Radiographs of the lumbar spine 05/23/2014. FLUOROSCOPY TIME:  Fluoroscopy Time: 18 seconds Radiation Exposure Index (if provided by the fluoroscopic device): 13.80 mGy Number of Acquired Spot Images: 2  PROCEDURE: Informed consent was obtained from the patient prior to the procedure by Candiss Norse, PA-C. With the patient prone, the lower back was prepped with Duraprep. 1% Lidocaine was used for local anesthesia. Lumbar puncture was performed at the L4-L5 level using an 18 gauge needle with return of clear CSF with an opening pressure of 17 cm water, and closing pressure of 11 cm water. 13 ml of CSF were obtained for laboratory studies. The patient tolerated the procedure well, and no immediate post-procedure complication was apparent. The procedure was performed by Candiss Norse, PA-C IMPRESSION: Technically successful fluoroscopically-guided lumbar puncture at the L4-L5 level. 13 mL CSF obtained for laboratory studies. Opening pressure: 17 cm water. Closing pressure: 11 cm water. Electronically Signed   By: Kellie Simmering D.O.   On: 03/29/2021 12:07    Procedures Procedures    Medications Ordered in ED Medications  sodium chloride 0.9 % bolus 1,000 mL (1,000 mLs Intravenous Not Given 03/31/21 0557)  busPIRone (BUSPAR) tablet 15 mg (15 mg Oral Given 03/31/21 0631)  ALPRAZolam (XANAX) tablet 0.5 mg (0.5 mg Oral Given 03/31/21 0630)  metoCLOPramide (REGLAN) injection 10 mg (10 mg Intravenous Given 03/31/21 0623)  diphenhydrAMINE (BENADRYL) injection 25 mg (25 mg Intravenous Given 03/31/21 0624)  lactated ringers bolus  2,000 mL (2,000 mLs Intravenous New Bag/Given 03/31/21 4782)    ED Course/ Medical Decision Making/ A&P Clinical Course as of 03/31/21 9562  Sat Mar 31, 2021  0721 Signed out to Dr Alvino Chapel to re-evaluate patient.  If patient can ambulate he can be discharged home [DW]    Clinical Course User Index [DW] Ripley Fraise, MD                           Medical Decision Making Amount and/or Complexity of Data Reviewed Labs: ordered.  Risk Prescription drug management.   This patient presents to the ED for concern of back pain and headache, this involves an extensive  number of treatment options, and is a complaint that carries with it a high risk of complications and morbidity.  The differential diagnosis includes migraine, post lumbar puncture headache, meningitis, intracranial hemorrhage, meningitis   Additional history obtained: Additional history obtained from spouse Records reviewed previous admission documents  Lab Tests: I Ordered, and personally interpreted labs.  The pertinent results include:  no acute findings   Cardiac Monitoring: The patient was maintained on a cardiac monitor.  I personally viewed and interpreted the cardiac monitor which showed an underlying rhythm of:  sinus rhythm  Medicines ordered and prescription drug management: I ordered medication including reglan/benadryl  for HA  Reevaluation of the patient after these medicines showed that the patient    improved    Reevaluation: After the interventions noted above, I reevaluated the patient and found that they have :improved  Complexity of problems addressed: Patients presentation is most consistent with  acute, uncomplicated illness       Patient now resting comfortably.  Patient is afebrile.  No neurodeficits.  No signs of meningitis. Plan to monitor and ensure he can ambulate       Final Clinical Impression(s) / ED Diagnoses Final diagnoses:  Post lumbar puncture headache    Rx / DC Orders ED Discharge Orders     None         Ripley Fraise, MD 03/31/21 [phone]

## 2021-04-01 DIAGNOSIS — E876 Hypokalemia: Secondary | ICD-10-CM | POA: Diagnosis present

## 2021-04-01 DIAGNOSIS — Z887 Allergy status to serum and vaccine status: Secondary | ICD-10-CM | POA: Diagnosis not present

## 2021-04-01 DIAGNOSIS — F909 Attention-deficit hyperactivity disorder, unspecified type: Secondary | ICD-10-CM | POA: Diagnosis present

## 2021-04-01 DIAGNOSIS — N6019 Diffuse cystic mastopathy of unspecified breast: Secondary | ICD-10-CM | POA: Diagnosis present

## 2021-04-01 DIAGNOSIS — Z91048 Other nonmedicinal substance allergy status: Secondary | ICD-10-CM | POA: Diagnosis not present

## 2021-04-01 DIAGNOSIS — Z87890 Personal history of sex reassignment: Secondary | ICD-10-CM

## 2021-04-01 DIAGNOSIS — Z87892 Personal history of anaphylaxis: Secondary | ICD-10-CM | POA: Diagnosis not present

## 2021-04-01 DIAGNOSIS — M797 Fibromyalgia: Secondary | ICD-10-CM | POA: Diagnosis present

## 2021-04-01 DIAGNOSIS — F419 Anxiety disorder, unspecified: Secondary | ICD-10-CM

## 2021-04-01 DIAGNOSIS — Z888 Allergy status to other drugs, medicaments and biological substances status: Secondary | ICD-10-CM | POA: Diagnosis not present

## 2021-04-01 DIAGNOSIS — G971 Other reaction to spinal and lumbar puncture: Secondary | ICD-10-CM | POA: Diagnosis present

## 2021-04-01 DIAGNOSIS — F640 Transsexualism: Secondary | ICD-10-CM | POA: Diagnosis present

## 2021-04-01 DIAGNOSIS — Y844 Aspiration of fluid as the cause of abnormal reaction of the patient, or of later complication, without mention of misadventure at the time of the procedure: Secondary | ICD-10-CM | POA: Diagnosis present

## 2021-04-01 DIAGNOSIS — Z880 Allergy status to penicillin: Secondary | ICD-10-CM | POA: Diagnosis not present

## 2021-04-01 DIAGNOSIS — Z9884 Bariatric surgery status: Secondary | ICD-10-CM | POA: Diagnosis not present

## 2021-04-01 DIAGNOSIS — Z9010 Acquired absence of unspecified breast and nipple: Secondary | ICD-10-CM | POA: Diagnosis not present

## 2021-04-01 DIAGNOSIS — Z885 Allergy status to narcotic agent status: Secondary | ICD-10-CM | POA: Diagnosis not present

## 2021-04-01 DIAGNOSIS — G379 Demyelinating disease of central nervous system, unspecified: Secondary | ICD-10-CM | POA: Diagnosis present

## 2021-04-01 DIAGNOSIS — J45909 Unspecified asthma, uncomplicated: Secondary | ICD-10-CM | POA: Diagnosis present

## 2021-04-01 DIAGNOSIS — Z79899 Other long term (current) drug therapy: Secondary | ICD-10-CM | POA: Diagnosis not present

## 2021-04-01 LAB — BASIC METABOLIC PANEL
Anion gap: 5 (ref 5–15)
BUN: <5 mg/dL — ABNORMAL LOW (ref 6–20)
CO2: 27 mmol/L (ref 22–32)
Calcium: 8.3 mg/dL — ABNORMAL LOW (ref 8.9–10.3)
Chloride: 108 mmol/L (ref 98–111)
Creatinine, Ser: 0.77 mg/dL (ref 0.61–1.24)
GFR, Estimated: >60 mL/min (ref >60)
Glucose, Bld: 106 mg/dL — ABNORMAL HIGH (ref 70–99)
Potassium: 3.7 mmol/L (ref 3.5–5.1)
Sodium: 140 mmol/L (ref 135–145)

## 2021-04-01 LAB — CBC
HCT: 39.1 % (ref 39.0–52.0)
Hemoglobin: 12.2 g/dL — ABNORMAL LOW (ref 13.0–17.0)
MCH: 26 pg (ref 26.0–34.0)
MCHC: 31.2 g/dL (ref 30.0–36.0)
MCV: 83.4 fL (ref 80.0–100.0)
Platelets: 263 10*3/uL (ref 150–400)
RBC: 4.69 MIL/uL (ref 4.22–5.81)
RDW: 14.2 % (ref 11.5–15.5)
WBC: 4.7 10*3/uL (ref 4.0–10.5)
nRBC: 0 % (ref 0.0–0.2)

## 2021-04-01 LAB — HIV ANTIBODY (ROUTINE TESTING W REFLEX): HIV Screen 4th Generation wRfx: NONREACTIVE

## 2021-04-01 LAB — MAGNESIUM: Magnesium: 1.9 mg/dL (ref 1.7–2.4)

## 2021-04-01 MED ORDER — ENOXAPARIN SODIUM 40 MG/0.4ML IJ SOSY
40.0000 mg | PREFILLED_SYRINGE | INTRAMUSCULAR | Status: DC
  Administered 2021-04-01 – 2021-04-02 (×2): 40 mg via SUBCUTANEOUS
  Filled 2021-04-01 (×2): qty 0.4

## 2021-04-01 MED ORDER — SUMATRIPTAN SUCCINATE 25 MG PO TABS
25.0000 mg | ORAL_TABLET | Freq: Once | ORAL | Status: AC
  Administered 2021-04-01: 25 mg via ORAL
  Filled 2021-04-01: qty 1

## 2021-04-01 MED ORDER — POTASSIUM CHLORIDE CRYS ER 20 MEQ PO TBCR
20.0000 meq | EXTENDED_RELEASE_TABLET | Freq: Two times a day (BID) | ORAL | Status: AC
  Administered 2021-04-01 (×2): 20 meq via ORAL
  Filled 2021-04-01 (×2): qty 1

## 2021-04-01 MED ORDER — ACETAMINOPHEN 500 MG PO TABS
1000.0000 mg | ORAL_TABLET | Freq: Once | ORAL | Status: AC
  Administered 2021-04-01: 1000 mg via ORAL
  Filled 2021-04-01: qty 2

## 2021-04-01 MED ORDER — LACTATED RINGERS IV SOLN: INTRAVENOUS | Status: AC

## 2021-04-01 NOTE — TOC Initial Note (Signed)
Transition of Care [facility]) - Initial/Assessment Note    Patient Details  Name: [name] MRN: [id number] Date of Birth: 14-Aug-1980  Transition of Care [facility]) CM/SW Contact:    Zenon Mayo, RN Phone Number: 04/01/2021, 2:24 PM  Clinical Narrative:                  Transition of Care [facility]) Screening Note   Patient Details  Name: [name] Date of Birth: September 24, 1980   Transition of Care [facility]) CM/SW Contact:    Zenon Mayo, RN Phone Number: 04/01/2021, 2:24 PM    Transition of Care Department [facility]) has reviewed patient and no TOC needs have been identified at this time. We will continue to monitor patient advancement through interdisciplinary progression rounds. If new patient transition needs arise, please place a TOC consult.          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/2018   Dislocation of patellofemoral joint 10/13/2017   Labor and delivery indication for care or intervention 02/15/2017   Pregnancy 01/01/2017   Encounter for procreative genetic counseling 88/01/314   Umbilical hernia without obstruction and without gangrene    Norovirus 06/24/2016   ADHD (attention deficit hyperactivity disorder) 06/20/2016   Anxiety 06/20/2016   Asthma without status asthmaticus 06/20/2016   Depression  06/20/2016   Fibrocystic breast disease 06/20/2016   Fibromyalgia 06/20/2016   Migraine 06/20/2016   Ovarian cyst 06/20/2016   Morbid obesity ([place]) 02/24/2014   Headache 07/26/2013   KNEE PAIN, BILATERAL 01/02/2010   TENDINITIS, PATELLAR 01/02/2010   PCP:  Marinda Elk, MD Pharmacy:   [facility] [place], [address] [place] [address] Phone: [phone] Fax: [phone]     Social Determinants of Health (SDOH) Interventions    Readmission Risk Interventions No flowsheet data found.

## 2021-04-01 NOTE — Progress Notes (Signed)
HD#0 SUBJECTIVE:  Patient Summary: [name] is a 41 y.o. transgender male with a pertinent PMH of anxiety, transgender s/p mastectomy, gastric sleeve surgery, ADHD, long COVID syndrome, who presents to [facility] with headache.  Overnight Events: none  Interim History: Patient was seen at bedside. He states that he was given triangle shaped medication which helped. States that Fioricet did not help much. Denies extremities weakness.   Reports 3 episodes of migraine a year. Don't feel like this is a migraine HA. This episode stays in the frontal lobe, spine and back.    OBJECTIVE:  Vital Signs: Vitals:   03/31/21 1500 03/31/21 1538 03/31/21 1942 04/01/21 0601  BP: 122/78 122/77 124/79 113/71  Pulse: 80 94 92 65  Resp: 16 16 17 18   Temp: 98.7 F (37.1 C) 99.5 F (37.5 C) 98.2 F (36.8 C) (!) 97.5 F (36.4 C)  TempSrc: Oral Oral Oral Oral  SpO2: 100% 100% 100% 100%   Supplemental O2: Room Air SpO2: 100 %  There were no vitals filed for this visit.   Intake/Output Summary (Last 24 hours) at 04/01/2021 5188 Last data filed at 04/01/2021 0500 Gross per 24 hour  Intake 565.9 ml  Output --  Net 565.9 ml   Net IO Since Admission: 565.9 mL [04/01/21 0634]  Physical Exam: Constitutional: Laying in bed, in no acute distress HENT: normocephalic atraumatic, mucous membranes moist Eyes: conjunctiva non-erythematous, photophobia, EOMI Neck: supple Cardiovascular: Regular rate and rhythm, no m/r/g Pulmonary/Chest: Normal work of breathing on room air, lungs clear to auscultation bilaterally Abdominal: soft, non-tender, non-distended MSK: normal bulk and tone Neurological: alert & oriented x 3, 5/5 strength in bilateral upper and lower extremities,  Skin: warm and dry Psych: Normal mood and affect  Patient Lines/Drains/Airways Status     Active Line/Drains/Airways     Name Placement date Placement time Site Days   Peripheral IV 03/31/21 22 G 1" Right;Lateral Forearm 03/31/21   2355  Forearm  1            Pertinent Labs: CBC Latest Ref Rng & Units 03/30/2021 10/30/2020 04/20/2020  WBC 4.0 - 10.5 K/uL 4.8 6.5 4.4  Hemoglobin 13.0 - 17.0 g/dL 12.9(L) 13.3 14.2  Hematocrit 39.0 - 52.0 % 40.8 41.5 43.7  Platelets 150 - 400 K/uL 289 256 215    CMP Latest Ref Rng & Units 03/30/2021 10/30/2020 04/20/2020  Glucose 70 - 99 mg/dL 142(H) 70 83  BUN 6 - 20 mg/dL <5(L) 6 <5(L)  Creatinine 0.61 - 1.24 mg/dL 0.99 0.91 0.93  Sodium 135 - 145 mmol/L 136 138 137  Potassium 3.5 - 5.1 mmol/L 3.2(L) 3.7 3.4(L)  Chloride 98 - 111 mmol/L 102 106 101  CO2 22 - 32 mmol/L 27 28 27   Calcium 8.9 - 10.3 mg/dL 8.4(L) 8.1(L) 8.6(L)  Total Protein 6.5 - 8.1 g/dL 6.5 6.2(L) -  Total Bilirubin 0.3 - 1.2 mg/dL 0.3 0.5 -  Alkaline Phos 38 - 126 U/L 80 87 -  AST 15 - 41 U/L 19 23 -  ALT 0 - 44 U/L 22 17 -    No results for input(s): GLUCAP in the last 72 hours.   Pertinent Imaging: No results found.  ASSESSMENT/PLAN:  Assessment: Principal Problem:   Post-dural puncture headache Active Problems:   ADHD (attention deficit hyperactivity disorder)   Anxiety   [name] is a 41 y.o. transgender male with a pertinent PMH of anxiety, transgender s/p mastectomy, gastric sleeve surgery, ADHD, long COVID syndrome, who  presents to [facility] with headache.  Plan: #Post lumbar puncture headache Patient states that headache started back up around 330 this morning.  He did not find Fioricet to be helpful thus far.  He does have a history of migraines, but states this does not feel like prior migraines.  Headache is in the frontal area whereas his migraines are typically behind his eyes. He has no neurological deficits on physical exam, no indication for repeating imaging.  Patient states that he did receive a medication was helpful but is unsure which one that was. Unfortunately epidural blood patch is not available on the weekend. -Consider blood patch patch if no improvement in symptoms -Will  treat with acetaminophen-butalbital-caffeine Q6h PRN -Will avoid opioids if possible   #Possible demyelinating disease Reports family history of MS. CSF studies show normal protein.  Pending oligoclonal and IgG study.  Patient has not had C-spine and T-spine MRI. -Follow-up with neurology outpatient as scheduled   #Hypokalemia Patient has intermittent hypokalemic episodes per chart review.  He is not on diuretics or medication that can lower potassium. K at 3.7 today, Mag 1.9. -Replete K -Repeat BMP in AM   #Anxiety #ADHD -Resume Xanax, Adderall and fluvoxamine   #Transgender surgery -Testosterone injection every Saturday  Best Practice: Diet: Regular diet IVF: NA VTE: rivaroxaban (XARELTO) tablet 10 mg Start: 03/31/21 1315 Code: Full AB: NA Status: Observation with expected length of stay less than 2 midnights. Anticipated Discharge Location: Home Barriers to Discharge: Medical stability  Signature: Daleen Bo. Jedaiah Rathbun, D.O.  Internal Medicine Resident, PGY-1 [facility]  Pager: [phone] 6:34 AM, 04/01/2021   Please contact the on call pager after 5 pm and on weekends at [phone].

## 2021-04-01 NOTE — Plan of Care (Signed)
°  Problem: Nutrition: Goal: Adequate nutrition will be maintained Outcome: Progressing   Problem: Pain Managment: Goal: General experience of comfort will improve Outcome: Not Progressing   

## 2021-04-02 DIAGNOSIS — E876 Hypokalemia: Secondary | ICD-10-CM | POA: Diagnosis not present

## 2021-04-02 DIAGNOSIS — G971 Other reaction to spinal and lumbar puncture: Secondary | ICD-10-CM | POA: Diagnosis not present

## 2021-04-02 DIAGNOSIS — F909 Attention-deficit hyperactivity disorder, unspecified type: Secondary | ICD-10-CM | POA: Diagnosis not present

## 2021-04-02 DIAGNOSIS — F419 Anxiety disorder, unspecified: Secondary | ICD-10-CM | POA: Diagnosis not present

## 2021-04-02 LAB — CBC
HCT: 38.1 % — ABNORMAL LOW (ref 39.0–52.0)
Hemoglobin: 11.7 g/dL — ABNORMAL LOW (ref 13.0–17.0)
MCH: 26 pg (ref 26.0–34.0)
MCHC: 30.7 g/dL (ref 30.0–36.0)
MCV: 84.7 fL (ref 80.0–100.0)
Platelets: 269 10*3/uL (ref 150–400)
RBC: 4.5 MIL/uL (ref 4.22–5.81)
RDW: 14.3 % (ref 11.5–15.5)
WBC: 4.6 10*3/uL (ref 4.0–10.5)
nRBC: 0 % (ref 0.0–0.2)

## 2021-04-02 LAB — IGG CSF INDEX
Albumin CSF-mCnc: 13 mg/dL (ref 10–45)
Albumin: 4 g/dL (ref 4.0–5.0)
CSF IgG Index: 1 — ABNORMAL HIGH (ref 0.0–0.7)
IgG (Immunoglobin G), Serum: 1098 mg/dL (ref 603–1613)
IgG, CSF: 3.7 mg/dL (ref 0.0–10.3)
IgG/Alb Ratio, CSF: 0.28 — ABNORMAL HIGH (ref 0.00–0.25)

## 2021-04-02 LAB — BASIC METABOLIC PANEL
Anion gap: 7 (ref 5–15)
BUN: <5 mg/dL — ABNORMAL LOW (ref 6–20)
CO2: 28 mmol/L (ref 22–32)
Calcium: 8.4 mg/dL — ABNORMAL LOW (ref 8.9–10.3)
Chloride: 106 mmol/L (ref 98–111)
Creatinine, Ser: 0.94 mg/dL (ref 0.61–1.24)
GFR, Estimated: >60 mL/min (ref >60)
Glucose, Bld: 102 mg/dL — ABNORMAL HIGH (ref 70–99)
Potassium: 4.2 mmol/L (ref 3.5–5.1)
Sodium: 141 mmol/L (ref 135–145)

## 2021-04-02 MED ORDER — LACTATED RINGERS IV SOLN: INTRAVENOUS | Status: AC

## 2021-04-02 NOTE — Progress Notes (Signed)
HD#1 SUBJECTIVE:  Patient Summary: [name] is a 41 y.o. transgender male with a pertinent PMH of anxiety, transgender s/p mastectomy, gastric sleeve surgery, ADHD, long COVID syndrome, who presents to [facility] with headache.  Overnight Events: none  Interim History: Patient was seen at bedside. Endorses a headache that goes to their neck and down their spine. The Fioricet helps at first but states that the effects wear off after some time. States that the muscles in their back hurt when they turn their head to the side. There are no other complaints or concerns at this time.   OBJECTIVE:  Vital Signs: Vitals:   04/01/21 1516 04/01/21 2014 04/02/21 0513 04/02/21 0744  BP: 118/80 138/88 108/72 106/74  Pulse: 94 95 76 76  Resp: 18 18 16 15   Temp: 98.8 F (37.1 C) 98.6 F (37 C) 97.6 F (36.4 C) 98 F (36.7 C)  TempSrc: Oral Oral Oral   SpO2: 100% 99% 100% 98%   Supplemental O2: Room Air SpO2: 98 %  There were no vitals filed for this visit.   Intake/Output Summary (Last 24 hours) at 04/02/2021 0746 Last data filed at 04/01/2021 2130 Gross per 24 hour  Intake 652.43 ml  Output --  Net 652.43 ml    Net IO Since Admission: 1,218.33 mL [04/02/21 0746]  Physical Exam: Constitutional: Laying in bed, in no acute distress HENT: normocephalic atraumatic, mucous membranes moist Eyes: conjunctiva non-erythematous, photophobia, EOMI Neck: supple Cardiovascular: Regular rate and rhythm, no m/r/g Pulmonary/Chest: Normal work of breathing on room air, lungs clear to auscultation bilaterally Abdominal: soft, non-tender, non-distended MSK: normal bulk and tone Neurological: alert & oriented x 3, 5/5 strength in bilateral upper and lower extremities,  Skin: warm and dry Psych: Normal mood and affect  Patient Lines/Drains/Airways Status     Active Line/Drains/Airways     Name Placement date Placement time Site Days   Peripheral IV 03/31/21 22 G 1" Right;Lateral Forearm 03/31/21   2355  Forearm  1            Pertinent Labs: CBC Latest Ref Rng & Units 04/01/2021 03/30/2021 10/30/2020  WBC 4.0 - 10.5 K/uL 4.7 4.8 6.5  Hemoglobin 13.0 - 17.0 g/dL 12.2(L) 12.9(L) 13.3  Hematocrit 39.0 - 52.0 % 39.1 40.8 41.5  Platelets 150 - 400 K/uL 263 289 256    CMP Latest Ref Rng & Units 04/01/2021 03/30/2021 10/30/2020  Glucose 70 - 99 mg/dL 106(H) 142(H) 70  BUN 6 - 20 mg/dL <5(L) <5(L) 6  Creatinine 0.61 - 1.24 mg/dL 0.77 0.99 0.91  Sodium 135 - 145 mmol/L 140 136 138  Potassium 3.5 - 5.1 mmol/L 3.7 3.2(L) 3.7  Chloride 98 - 111 mmol/L 108 102 106  CO2 22 - 32 mmol/L 27 27 28   Calcium 8.9 - 10.3 mg/dL 8.3(L) 8.4(L) 8.1(L)  Total Protein 6.5 - 8.1 g/dL - 6.5 6.2(L)  Total Bilirubin 0.3 - 1.2 mg/dL - 0.3 0.5  Alkaline Phos 38 - 126 U/L - 80 87  AST 15 - 41 U/L - 19 23  ALT 0 - 44 U/L - 22 17    No results for input(s): GLUCAP in the last 72 hours.   Pertinent Imaging: No results found.  ASSESSMENT/PLAN:  Assessment: Principal Problem:   Post-dural puncture headache Active Problems:   ADHD (attention deficit hyperactivity disorder)   Anxiety   [name] is a 41 y.o. transgender male with a pertinent PMH of anxiety, transgender s/p mastectomy, gastric sleeve surgery, ADHD,  long COVID syndrome, who presents to [facility] with headache.  Plan: #Post lumbar puncture headache Headache is improving per patient.  Still have symptoms with sitting up and ambulating.  We have contacted the IR department about a blood patch and the PA will speak to the patient today.  This will not be done inpatient.  We will continue Fioricet as needed and hopefully he is comfortable of going home tomorrow.  Patient can follow-up with the blood patch clinic outpatient if needed. -Continue acetaminophen-butalbital-caffeine Q6h PRN -Sumatriptan as needed -Will avoid opioids if possible  #Possible demyelinating disease Reports family history of MS. CSF studies show normal protein.  Pending  oligoclonal and IgG study.  Patient has not had C-spine and T-spine MRI. -Follow-up with neurology outpatient as scheduled  #Hypokalemia-resolved  #Anxiety #ADHD -Resume Xanax, Adderall, Lexapro and fluvoxamine  #Transgender surgery -Testosterone injection every Saturday  Best Practice: Diet: Regular diet IVF: NA VTE: lovenox Code: Full AB: NA Anticipated Discharge Location: Home Barriers to Discharge: Medical stability  Signature: Daleen Bo. Kemesha Mosey, D.O.  Internal Medicine Resident, PGY-1 [facility]  Pager: [phone] 7:46 AM, 04/02/2021   Please contact the on call pager after 5 pm and on weekends at [phone].

## 2021-04-02 NOTE — Progress Notes (Signed)
41  y.o. inpatient. Recent work up for MS including LP at [facility] on 1.19.23. Presented to the ED at [facility] on 1.21.23 with back pain., headache and nausea. Team called requesting information for possible blood patch. Team made aware that procedure would need to be scheduled as OP at [facility].

## 2021-04-03 DIAGNOSIS — G971 Other reaction to spinal and lumbar puncture: Secondary | ICD-10-CM | POA: Diagnosis not present

## 2021-04-03 DIAGNOSIS — F419 Anxiety disorder, unspecified: Secondary | ICD-10-CM | POA: Diagnosis not present

## 2021-04-03 DIAGNOSIS — F909 Attention-deficit hyperactivity disorder, unspecified type: Secondary | ICD-10-CM | POA: Diagnosis not present

## 2021-04-03 DIAGNOSIS — E876 Hypokalemia: Secondary | ICD-10-CM | POA: Diagnosis not present

## 2021-04-03 LAB — OLIGOCLONAL BANDS, CSF + SERM

## 2021-04-03 MED ORDER — LACTATED RINGERS IV BOLUS
1000.0000 mL | Freq: Once | INTRAVENOUS | Status: AC
  Administered 2021-04-03: 10:00:00 1000 mL via INTRAVENOUS

## 2021-04-03 MED ORDER — BUTALBITAL-APAP-CAFFEINE 50-325-40 MG PO TABS: 1.0000 | ORAL_TABLET | Freq: Four times a day (QID) | ORAL | 0 refills | Status: DC | PRN

## 2021-04-03 NOTE — Discharge Summary (Signed)
Name: [name] MRN: [id number] DOB: 1981-01-16 41 y.o. PCP: Marinda Elk, MD  Date of Admission: 03/30/2021  6:54 PM Date of Discharge: 04/03/2021 Attending Physician: Charise Killian MD  Discharge Diagnosis: 1. Post LP headache 2. Possible demyelinating disease 3. Anxiety, ADHD 4. Gender dysphoria  Discharge Medications: Allergies as of 04/03/2021       Reactions   Penicillins Anaphylaxis   Did it involve swelling of the face/tongue/throat, SOB, or low BP? Yes Did it involve sudden or severe rash/hives, skin peeling, or any reaction on the inside of your mouth or nose? Unknown Did you need to seek medical attention at a hospital or doctor's office? Yes When did it last happen?       If all above answers are NO, may proceed with cephalosporin use.   Remdesivir Hives, Itching   Hives all over body and severe itching  Hives all over body and severe itching  Hives all over body and severe itching    Chlorhexidine Gluconate Hives   Clonazepam    Indifference to life attitude   Dexamethasone Hives   Doxycycline Nausea And Vomiting   Severe vomiting   Fluoxetine Other (See Comments)   Suicidal thoughts   Nsaids    No nsaids due to gastric surgery   Other Other (See Comments)   CHLORAHEXADINE - unknown   Tolmetin    Other reaction(s): Other (See Comments), Unknown Other reaction: No nsaids due to gastric surgery No nsaids due to gastric surgery Other reaction: No nsaids due to gastric surgery   Hydrocodone-acetaminophen Hives, Rash   Tape Rash   Tuberculin Ppd Rash        Medication List     TAKE these medications    ADEK GUMMIES PLUS ZN PO Take 1 tablet by mouth daily.   albuterol 108 (90 Base) MCG/ACT inhaler Commonly known as: VENTOLIN HFA Inhale 1-2 puffs into the lungs every 4 (four) hours as needed for shortness of breath or wheezing.   ALPRAZolam 0.5 MG tablet Commonly known as: XANAX Take 0.5 mg by mouth daily as needed for anxiety.    amphetamine-dextroamphetamine 20 MG tablet Commonly known as: ADDERALL Take 20 mg by mouth 3 (three) times daily.   BD Disp Needles 25G X 5/8" Misc Generic drug: NEEDLE (DISP) 25 G SMARTSIG:Injection Once a Week   busPIRone 15 MG tablet Commonly known as: BUSPAR Take 15 mg by mouth 4 (four) times daily.   butalbital-acetaminophen-caffeine 50-325-40 MG tablet Commonly known as: FIORICET Take 1 tablet by mouth every 6 (six) hours as needed for headache.   CALCIUM 500 PO Take 500 mg by mouth 4 (four) times daily. chewable   Creon 36000 UNITS Cpep capsule Generic drug: lipase/protease/amylase Take 72,000 Units by mouth 3 (three) times daily with meals.   dronabinol 5 MG capsule Commonly known as: MARINOL Take 5 mg by mouth 2 (two) times daily as needed (abdominal pain).   ergocalciferol 1.25 MG (50000 UT) capsule Commonly known as: VITAMIN D2 Take 1 capsule by mouth once a week.   escitalopram 20 MG tablet Commonly known as: LEXAPRO Take 20 mg by mouth daily.   fluvoxaMINE 25 MG tablet Commonly known as: LUVOX Take 25 mg by mouth 2 (two) times daily. Takes along with 50 mg to total 75 mg   fluvoxaMINE 50 MG tablet Commonly known as: LUVOX Take 50 mg by mouth 2 (two) times daily. Takes along with 25 to total 75 mg   metoprolol succinate 25 MG 24  hr tablet Commonly known as: TOPROL-XL Take 25 mg by mouth daily.   neomycin-polymyxin b-dexamethasone 3.5-10000-0.1 Susp Commonly known as: MAXITROL Place 1 drop into both eyes daily as needed (dryness/inflammation).   ondansetron 4 MG disintegrating tablet Commonly known as: ZOFRAN-ODT Take 4 mg by mouth every 8 (eight) hours as needed for vomiting or nausea.   OVER THE COUNTER MEDICATION Take 1 capsule by mouth daily. Lypoic acid   oxyCODONE-acetaminophen 5-325 MG tablet Commonly known as: PERCOCET/ROXICET Take 1-2 tablets by mouth every 8 (eight) hours as needed for severe pain.   SUPER B COMPLEX/VITAMIN C  PO Take 1 tablet by mouth daily.   testosterone cypionate 200 MG/ML injection Commonly known as: DEPOTESTOSTERONE CYPIONATE Inject 60 mg into the skin every Saturday.   vitamin E 1000 UNIT capsule Take 1,000 Units by mouth daily.   zinc gluconate 50 MG tablet Take 50 mg by mouth in the morning and at bedtime.   zolmitriptan 2.5 MG disintegrating tablet Commonly known as: ZOMIG-ZMT Take 2.5 mg by mouth daily as needed for migraine.        Disposition and follow-up:    [name] was discharged from [facility] in Stable condition.  At the hospital follow up visit please address:  Post LP headache- Patient improved some over the weekend and with Fioricet.  They noted continued symptoms with sitting up and walking.  Blood patch is only done as an outpatient procedure and patient was encouraged to follow-up with Spine Clinic if they are interested in blood patch.  Possible demyelinating disease -Follow-up with neurology as scheduled and follow-up on CSF studies  Labs / imaging needed at time of follow-up: none  Pending labs/ test needing follow-up: none  Follow-up Appointments:  Neurology 04/23  PCP in 1 week  Hospital Course by problem list:  #Post lumbar puncture headache Patient had an MRI in December 08 for expressive aphasia, left-sided weakness, and acute encephalopathy, which show a single focus of FLAIR signal abnormality in the right frontal lobe suspicious for demyelinating lesion.  Patient reports family history of MS in paternal first cousin.  He followed-up with neurology after and was ordered MRI of C-spine and T-spine.  He also had a lumbar puncture on 1/19 which collected 13 cc of CSF.   Patient reports severe headache the day after his lumbar puncture. Report pains in the front of the head, his spine and also lower back. He has classic symptoms of post LP headache including worse with standing up and movement and better with lying down. He  denies any weakness of the upper extremity or lower extremities. No head imaging was indicated.  Unfortunately there was no blood patch transfusion available on the weekend.  Headache improved over the next 2 days.  Patient was given Fioricet as needed and continue his home triptans.  He has clinical presentation improved significantly on the day of discharge.  He is advised to follow-up with the spine clinic outpatient if he will need the blood patch transfusion.  He will follow-up with neurology as scheduled and his PCP in 1 -2 week  Subjective: Pt was seen at bedside this AM. Endorses still not feeling great. Still need to lie down to help alleviate his HA. States that his symptoms are improving overall however. No other complaints or concerns at this time.  Discharge Exam:   BP 120/83 (BP Location: Right Arm)    Pulse 78    Temp 98 F (36.7 C) (Oral)  Resp 16    SpO2 98%  Discharge exam:  Constitutional: Laying in bed, in no acute distress HENT: normocephalic atraumatic, mucous membranes moist Eyes: conjunctiva non-erythematous, photophobia, EOMI Neck: supple Cardiovascular: Regular rate and rhythm, no m/r/g Pulmonary/Chest: Normal work of breathing on room air, lungs clear to auscultation bilaterally Abdominal: soft, non-tender, non-distended MSK: normal bulk and tone Neurological: alert & oriented x 3, 5/5 strength in bilateral upper and lower extremities Skin: warm and dry Psych: Normal mood and affect  Pertinent Labs, Studies, and Procedures:   CBC Latest Ref Rng & Units 04/02/2021 04/01/2021 03/30/2021  WBC 4.0 - 10.5 K/uL 4.6 4.7 4.8  Hemoglobin 13.0 - 17.0 g/dL 11.7(L) 12.2(L) 12.9(L)  Hematocrit 39.0 - 52.0 % 38.1(L) 39.1 40.8  Platelets 150 - 400 K/uL 269 263 289   BMP Latest Ref Rng & Units 04/02/2021 04/01/2021 03/30/2021  Glucose 70 - 99 mg/dL 102(H) 106(H) 142(H)  BUN 6 - 20 mg/dL <5(L) <5(L) <5(L)  Creatinine 0.61 - 1.24 mg/dL 0.94 0.77 0.99  Sodium 135 - 145 mmol/L  141 140 136  Potassium 3.5 - 5.1 mmol/L 4.2 3.7 3.2(L)  Chloride 98 - 111 mmol/L 106 108 102  CO2 22 - 32 mmol/L 28 27 27   Calcium 8.9 - 10.3 mg/dL 8.4(L) 8.3(L) 8.4(L)    Discharge Instructions: Discharge Instructions     Diet - low sodium heart healthy   Complete by: As directed    Discharge instructions   Complete by: As directed    Kolson,  You were recently admitted to [facility] for Post-dural headache.   Continue taking your home medications with the following changes  Start taking Fioricet as needed up to 4 times a day and follow up with Spine clinic for blood patch.  Please try to see your Primary Care provider within 1-2 weeks for hospital follow-up. We are so glad that you are feeling better.  Sincerely, Christiana Fuchs, DO   Increase activity slowly   Complete by: As directed        Signed: Christiana Fuchs, DO 04/03/2021, 2:43 PM   Pager: [phone]

## 2021-04-03 NOTE — Progress Notes (Signed)
Discharge instructions provided to the patient, medication list reviewed, ivs removed. Patient discharged

## 2021-04-03 NOTE — Plan of Care (Signed)
  Problem: Activity: Goal: Risk for activity intolerance will decrease Outcome: Progressing   Problem: Nutrition: Goal: Adequate nutrition will be maintained Outcome: Progressing   Problem: Pain Managment: Goal: General experience of comfort will improve Outcome: Progressing   

## 2021-04-04 ENCOUNTER — Ambulatory Visit
Admission: RE | Admit: 2021-04-04 | Discharge: 2021-04-04 | Disposition: A | Discharge status: Home / Self Care | Payer: BC Managed Care – PPO | Source: Ambulatory Visit | Attending: Emergency Medicine | Admitting: Emergency Medicine

## 2021-04-04 ENCOUNTER — Other Ambulatory Visit: Payer: Self-pay

## 2021-04-04 DIAGNOSIS — G971 Other reaction to spinal and lumbar puncture: Secondary | ICD-10-CM

## 2021-04-04 MED ORDER — IOPAMIDOL (ISOVUE-M 300) INJECTION 61%
1.0000 mL | Freq: Once | INTRAMUSCULAR | Status: AC
  Administered 2021-04-04: 1 mL via EPIDURAL

## 2021-04-04 NOTE — Discharge Instructions (Signed)

## 2021-04-10 ENCOUNTER — Ambulatory Visit
Admission: RE | Admit: 2021-04-10 | Discharge: 2021-04-10 | Disposition: A | Discharge status: Home / Self Care | Payer: BC Managed Care – PPO | Source: Ambulatory Visit | Attending: Neurology | Admitting: Neurology

## 2021-04-10 DIAGNOSIS — G939 Disorder of brain, unspecified: Secondary | ICD-10-CM | POA: Diagnosis present

## 2021-04-10 MED ORDER — GADOBUTROL 1 MMOL/ML IV SOLN
7.0000 mL | Freq: Once | INTRAVENOUS | Status: AC | PRN
  Administered 2021-04-10: 7 mL via INTRAVENOUS

## 2021-05-11 IMAGING — MG DIGITAL DIAGNOSTIC BILAT W/ TOMO W/ CAD
6 of 10 series · 6 of 30 positions shown · non-contrast
Comparison: Previous exam(s).

CLINICAL DATA: Follow-up right breast probably benign mass seen in
4025 when breast feeding. The patient did not return for a
recommended 3 month follow-up ultrasound. The patient has been
taking testosterone since September 2019. The patient would like to have
a bilateral mastectomy.

EXAM:
DIGITAL DIAGNOSTIC BILATERAL MAMMOGRAM WITH TOMOSYNTHESIS AND CAD;
ULTRASOUND RIGHT BREAST LIMITED
TECHNIQUE: Bilateral digital diagnostic mammography and breast tomosynthesis
was performed. The images were evaluated with computer-aided
detection.; Targeted ultrasound examination of the right breast was
performed

[R CC synth-2D]
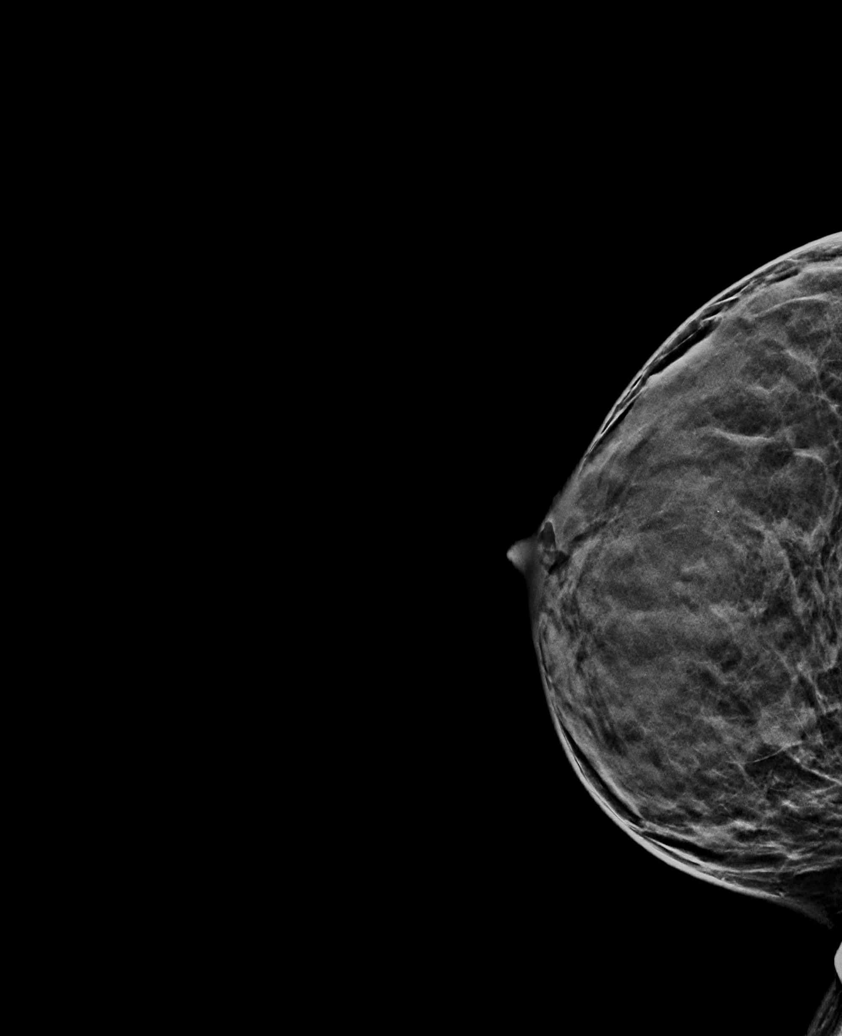

[L CC synth-2D (1 of 2)]
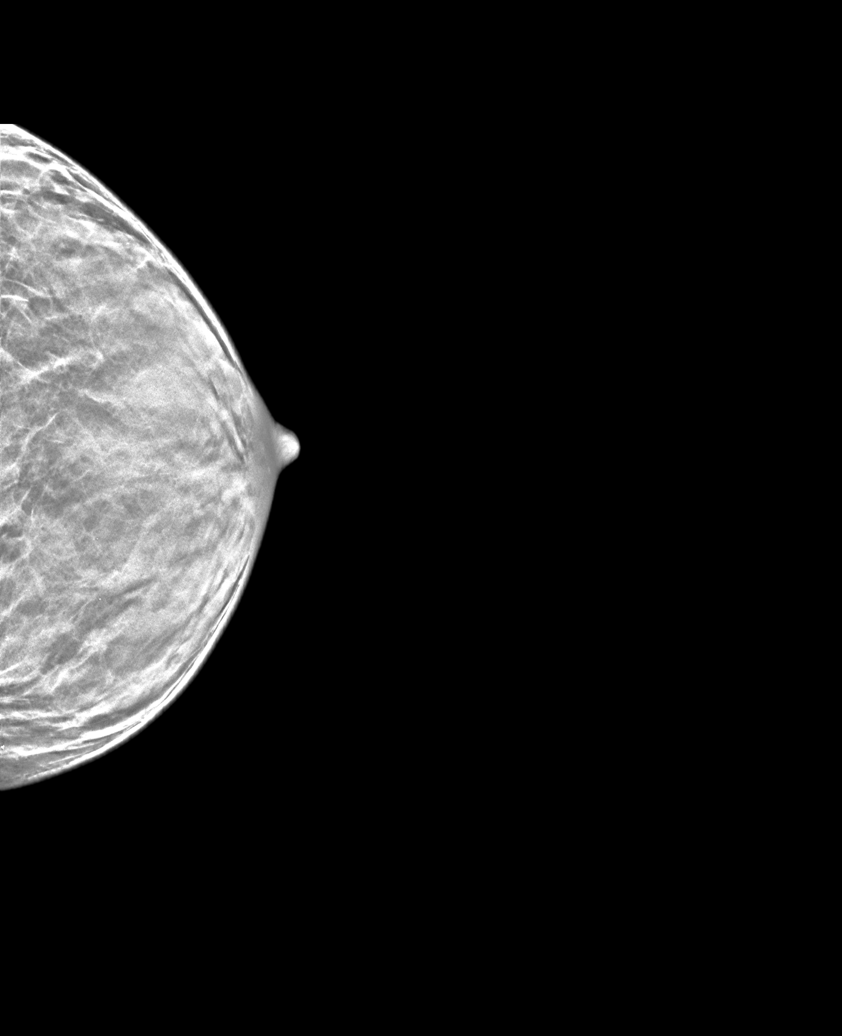

[L CC synth-2D (2 of 2)]
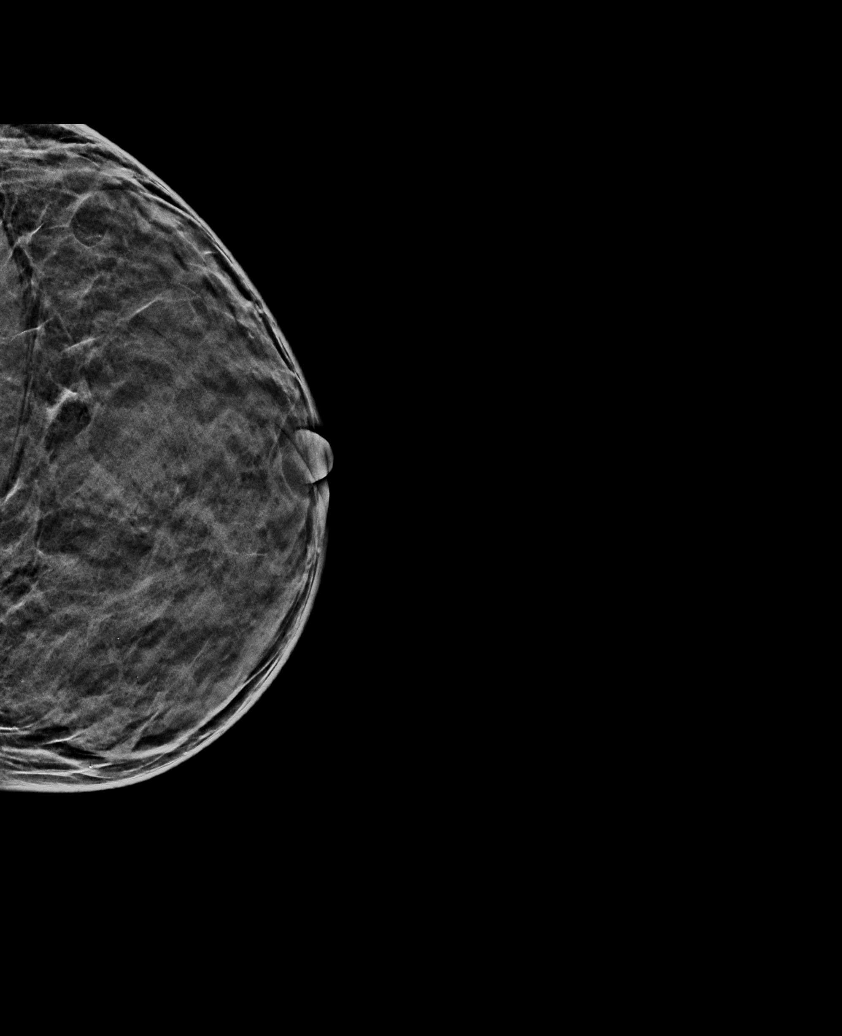

[L MLO synth-2D]
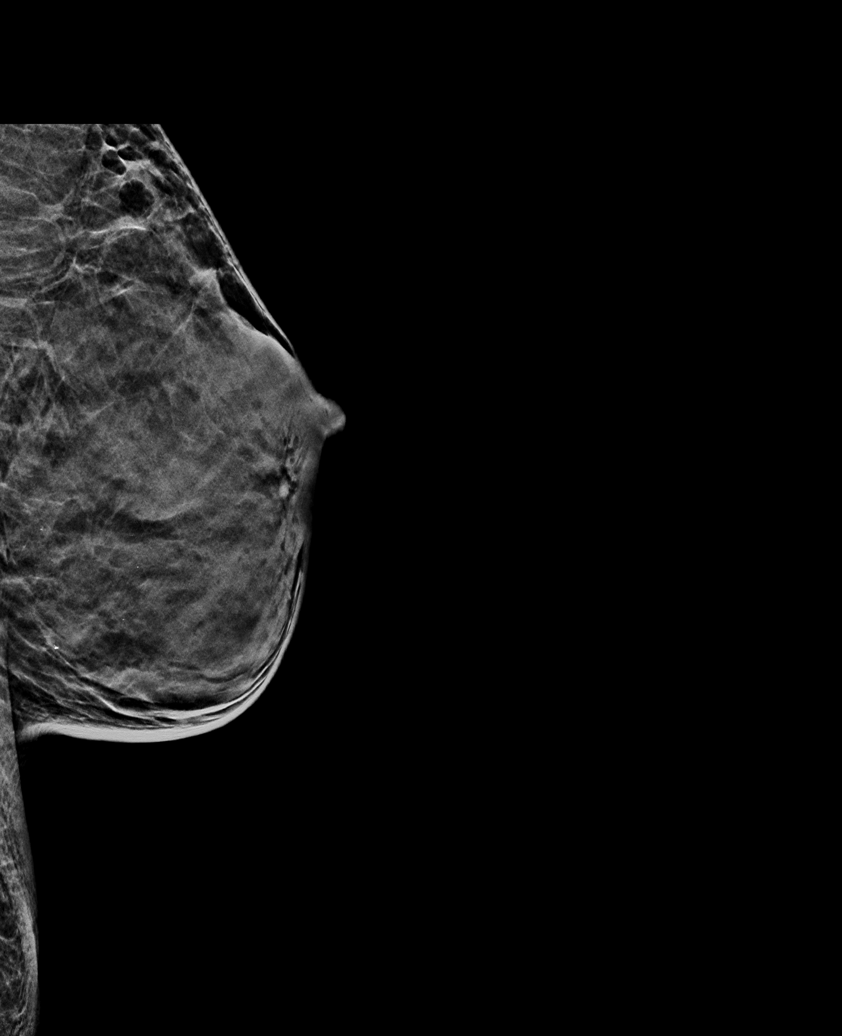

[R MLO synth-2D]
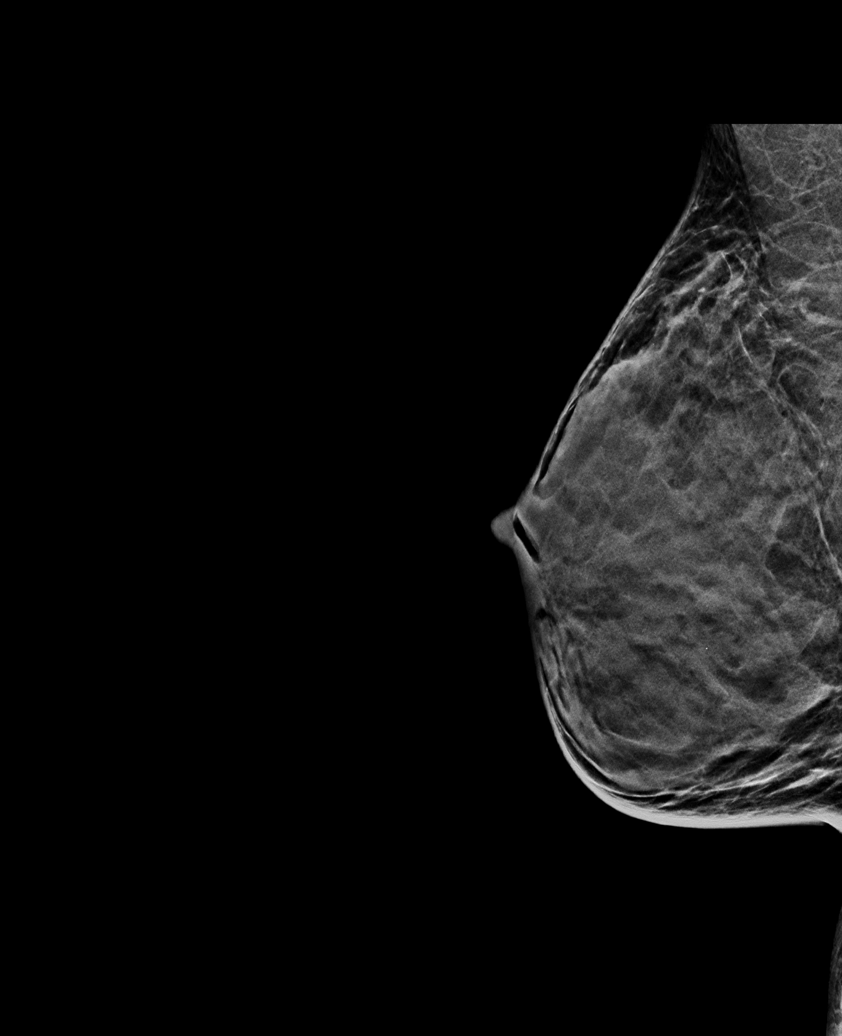

[R MLO tomo · tomo slice 19/36.0]
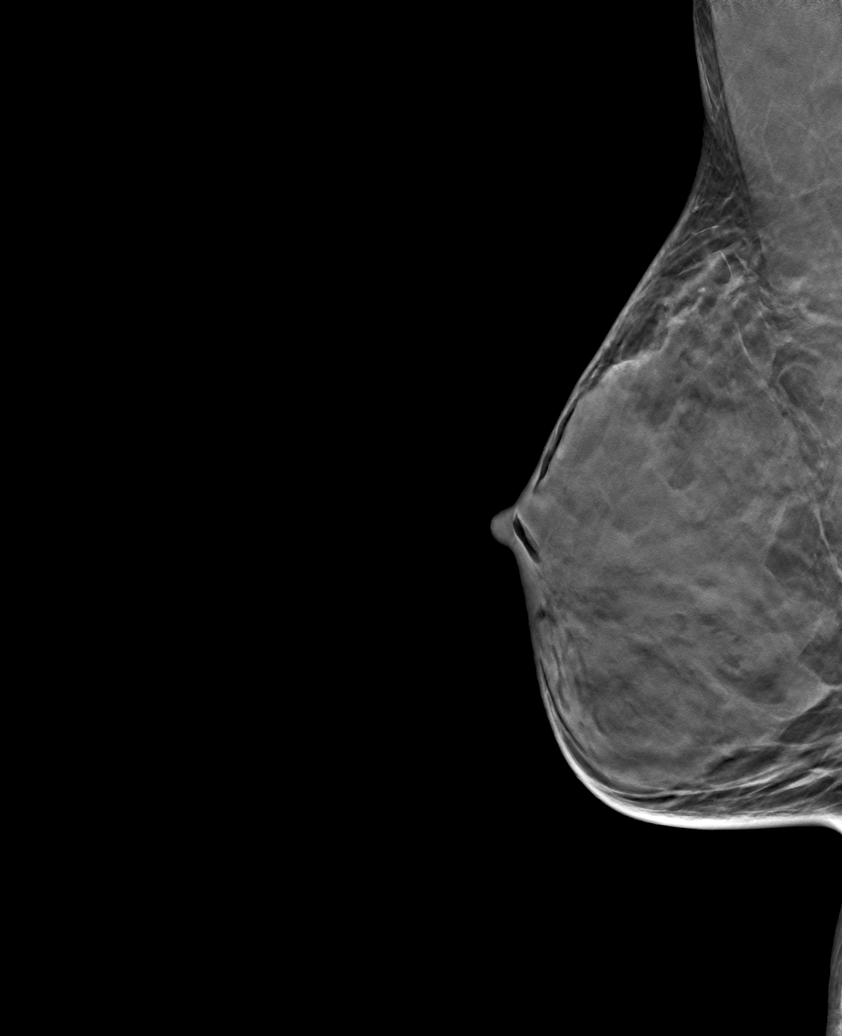

[6 of 30 positions shown; findings below may reference images not displayed]

ACR Breast Density Category d: The breast tissue is extremely dense,
which lowers the sensitivity of mammography.
FINDINGS: Interval significant decrease in size and increase in density of
both breasts. No mammographically visible mass or other findings
suspicious for malignancy in either breast.

On physical exam, no mass is palpable in the outer right breast.

Targeted ultrasound is performed, showing normal appearing breast
tissue throughout the outer right breast including the location the
previously demonstrated mass in the 9 o'clock position, 7 cm from
the nipple.
IMPRESSION: 1. Resolved right breast lactating adenoma.
2. No evidence of malignancy in either breast.

RECOMMENDATION:
Bilateral screening mammogram in 1 year if the patient does not have
the planned bilateral mastectomy.

I have discussed the findings and recommendations with the patient.
If applicable, a reminder letter will be sent to the patient
regarding the next appointment.

BI-RADS CATEGORY  1: Negative.

## 2021-07-01 ENCOUNTER — Ambulatory Visit (HOSPITAL_COMMUNITY)
Admission: EM | Admit: 2021-07-01 | Discharge: 2021-07-01 | Disposition: A | Discharge status: Home / Self Care | Payer: BC Managed Care – PPO

## 2021-07-01 ENCOUNTER — Encounter (HOSPITAL_COMMUNITY): Payer: Self-pay | Admitting: Physician Assistant

## 2021-07-01 ENCOUNTER — Ambulatory Visit (INDEPENDENT_AMBULATORY_CARE_PROVIDER_SITE_OTHER): Payer: BC Managed Care – PPO

## 2021-07-01 DIAGNOSIS — S92255A Nondisplaced fracture of navicular [scaphoid] of left foot, initial encounter for closed fracture: Secondary | ICD-10-CM | POA: Diagnosis not present

## 2021-07-01 DIAGNOSIS — W19XXXA Unspecified fall, initial encounter: Secondary | ICD-10-CM | POA: Diagnosis not present

## 2021-07-01 HISTORY — DX: Myalgic encephalomyelitis/chronic fatigue syndrome: G93.32

## 2021-07-01 HISTORY — DX: Post covid-19 condition, unspecified: U09.9

## 2021-07-01 HISTORY — DX: Multiple sclerosis: G35

## 2021-07-01 HISTORY — DX: Encephalitis and encephalomyelitis, unspecified: G04.90

## 2021-07-01 HISTORY — DX: Demyelinating disease of central nervous system, unspecified: G37.9

## 2021-07-01 MED ORDER — OXYCODONE-ACETAMINOPHEN 5-325 MG PO TABS: 1.0000 | ORAL_TABLET | Freq: Two times a day (BID) | ORAL | 0 refills | Status: AC | PRN

## 2021-07-01 NOTE — Discharge Instructions (Signed)
You have a fracture.  Please stay in cam boot until you are seen by specialist.  You should not put any weight on your foot and use crutches to get around until seen by specialist.  Please call to schedule appointment with podiatrist first thing tomorrow.  I have called in a couple doses of oxycodone for pain relief.  This can be sedating and you should not drive or drink alcohol taking it.  You should avoid use of this with your Xanax (alprazolam) as the combination can increase your risk of overdose and complications.  Keep your foot elevated.  If you develop any increased pain, weakness, numbness you need to go to the emergency room. ?

## 2021-07-01 NOTE — ED Provider Notes (Addendum)
[facility]    CSN: [id number] Arrival date & time: 07/01/21  1720      History   Chief Complaint Chief Complaint  Patient presents with   Fall    Arm pain, ankle pain, abd pain    HPI [name] is a 41 y.o. adult.   Patient presents today with a 6-hour history of left foot pain following injury.  Reports that they were in the process of moving when they stood on a small rock which then shifted causing him to land on the front of their left foot.  Pain is rated 10 on a 0-10 pain scale, localized to dorsal left foot, described as sharp, worse with attempted ambulation, no alleviating factors identified.  They have tried Tylenol without improvement of symptoms.  Denies previous injury involving ankle or foot.  Did have bunionectomy on left foot many years ago.  Denies any paresthesias.   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/2018   Dislocation of patellofemoral joint 10/13/2017   Labor and delivery indication for care or intervention 02/15/2017   Pregnancy 01/01/2017   Encounter for procreative genetic counseling 08/02/2016   Umbilical hernia without obstruction and without gangrene    Norovirus 06/24/2016   ADHD (attention deficit hyperactivity disorder) 06/20/2016   Anxiety 06/20/2016   Asthma without status asthmaticus 06/20/2016   Depression 06/20/2016   Fibrocystic breast disease 06/20/2016   Fibromyalgia 06/20/2016   Migraine 06/20/2016   Ovarian  cyst 06/20/2016   Morbid obesity (HCC) 02/24/2014   Headache 07/26/2013   KNEE PAIN, BILATERAL 01/02/2010   TENDINITIS, PATELLAR 01/02/2010    Past Surgical History:  Procedure Laterality Date   BUNIONECTOMY Bilateral    COLONOSCOPY WITH PROPOFOL N/A 08/19/2019   Procedure: COLONOSCOPY WITH PROPOFOL;  Surgeon: Toney Reil, MD;  Location: [facility] ENDOSCOPY;  Service: Gastroenterology;  Laterality: N/A;   ESOPHAGOGASTRODUODENOSCOPY (EGD) WITH PROPOFOL N/A 08/19/2019   Procedure: ESOPHAGOGASTRODUODENOSCOPY (EGD) WITH PROPOFOL;  Surgeon: Toney Reil, MD;  Location: [facility] ENDOSCOPY;  Service: Gastroenterology;  Laterality: N/A;   EYE SURGERY     blood clot in eye   HERNIA REPAIR  1991, 2001   inguinal and umbilical   LAPAROSCOPIC GASTRIC SLEEVE RESECTION  2015   TOTAL MASTECTOMY Bilateral     OB History     Gravida  2   Para      Term      Preterm      AB      Living  0      SAB  0   IAB      Ectopic      Multiple      Live Births               Home Medications    Prior to Admission medications   Medication Sig Start Date End Date Taking? Authorizing Provider  albuterol (VENTOLIN HFA)  108 (90 Base) MCG/ACT inhaler Inhale 1-2 puffs into the lungs every 4 (four) hours as needed for shortness of breath or wheezing.   Yes [provider]  ALPRAZolam Prudy Feeler) 0.5 MG tablet Take 0.5 mg by mouth daily as needed for anxiety. 02/09/21  Yes [provider]  amphetamine-dextroamphetamine (ADDERALL) 20 MG tablet Take 20 mg by mouth 3 (three) times daily. 03/07/21  Yes [provider]  B Complex-C (SUPER B COMPLEX/VITAMIN C PO) Take 1 tablet by mouth daily.    Yes [provider]  busPIRone (BUSPAR) 15 MG tablet Take 15 mg by mouth 4 (four) times daily. 10/09/20  Yes [provider]  butalbital-acetaminophen-caffeine (FIORICET) 50-325-40 MG tablet Take 1 tablet by mouth every 6 (six) hours as needed for headache.  04/03/21  Yes Masters, Katie, DO  Calcium Carbonate (CALCIUM 500 PO) Take 500 mg by mouth 4 (four) times daily. chewable   Yes [provider]  CALCIUM MAGNESIUM ZINC PO Take by mouth.   Yes [provider]  Cholecalciferol (VITAMIN D3 PO) Take by mouth.   Yes [provider]  CREON 36000-114000 units CPEP capsule Take 72,000 Units by mouth 3 (three) times daily with meals. 03/19/21  Yes [provider]  diclofenac Sodium (VOLTAREN) 1 % GEL Apply topically 4 (four) times daily.   Yes [provider]  dronabinol (MARINOL) 5 MG capsule Take 5 mg by mouth 2 (two) times daily as needed (abdominal pain). 06/24/19  Yes [provider]  fluvoxaMINE (LUVOX) 50 MG tablet Take 50 mg by mouth 2 (two) times daily. Takes along with 25 to total 75 mg 03/20/21  Yes [provider]  HYDROXYZINE HCL PO Take by mouth.   Yes [provider]  metoprolol succinate (TOPROL-XL) 25 MG 24 hr tablet Take 25 mg by mouth daily. 03/21/21  Yes [provider]  Multiple Vitamin (MULTIVITAMIN PO) Take by mouth.   Yes [provider]  ondansetron (ZOFRAN-ODT) 4 MG disintegrating tablet Take 4 mg by mouth every 8 (eight) hours as needed for vomiting or nausea. 04/10/19  Yes [provider]  oxyCODONE-acetaminophen (PERCOCET/ROXICET) 5-325 MG tablet Take 1 tablet by mouth 2 (two) times daily as needed for up to 2 days for severe pain. 07/01/21 07/03/21 Yes Limmie Schoenberg K, PA-C  pantoprazole (PROTONIX) 40 MG tablet Take 40 mg by mouth daily.   Yes [provider]  testosterone cypionate (DEPOTESTOSTERONE CYPIONATE) 200 MG/ML injection Inject into the skin every Saturday. Taken 0.27ml 09/29/19  Yes [provider]  UNABLE TO FIND Med Name: capaxone inj 3 times/wk   Yes [provider]  vitamin E 1000 UNIT capsule Take 1,000 Units by mouth daily.   Yes [provider]  zinc gluconate 50 MG tablet Take 50 mg by  mouth in the morning and at bedtime.   Yes [provider]  zolmitriptan (ZOMIG-ZMT) 2.5 MG disintegrating tablet Take 2.5 mg by mouth daily as needed for migraine. 03/24/19  Yes [provider]  BD DISP NEEDLES 25G X 5/8" MISC SMARTSIG:Injection Once a Week 09/29/19   [provider]  ergocalciferol (VITAMIN D2) 1.25 MG (50000 UT) capsule Take 1 capsule by mouth once a week. Patient not taking: Reported on 03/31/2021 03/28/21   [provider]  escitalopram (LEXAPRO) 20 MG tablet Take 20 mg by mouth daily. Patient not taking: Reported on 10/30/2020 10/09/20   [provider]  Multiple Vitamins-Minerals (ADEK GUMMIES PLUS ZN PO) Take 1 tablet by mouth daily.  [provider]  neomycin-polymyxin b-dexamethasone (MAXITROL) 3.5-10000-0.1 SUSP Place 1 drop into both eyes daily as needed (dryness/inflammation). Patient not taking: Reported on 03/31/2021    [provider]  OVER THE COUNTER MEDICATION Take 1 capsule by mouth daily. Lypoic acid    [provider]    Family History Family History  Problem Relation Age of Onset   Breast cancer Paternal Aunt    Breast cancer Paternal Aunt    Healthy Mother    CVA Father    Hypertension Father    Diabetes Father    Breast cancer Paternal Grandmother     Social History Social History   Tobacco Use   Smoking status: Former    Types: Cigarettes    Quit date: 06/21/2003    Years since quitting: 18.0   Smokeless tobacco: Never  Vaping Use   Vaping Use: Never used  Substance Use Topics   Alcohol use: No   Drug use: No     Allergies   Penicillins, Remdesivir, Chlorhexidine gluconate, Clonazepam, Dexamethasone, Doxycycline, Fluoxetine, Nsaids, Other, Tolmetin, Hydrocodone-acetaminophen, Tape, and Tuberculin ppd   Review of Systems Review of Systems  Constitutional:  Positive for activity change. Negative for appetite change, fatigue and fever.  Musculoskeletal:  Positive  for arthralgias, gait problem and joint swelling. Negative for myalgias.  Skin:  Negative for color change and wound.  Neurological:  Negative for weakness and numbness.    Physical Exam Triage Vital Signs ED Triage Vitals  Enc Vitals Group     BP 07/01/21 1840 127/73     Pulse Rate 07/01/21 1840 93     Resp 07/01/21 1840 16     Temp 07/01/21 1840 98.7 F (37.1 C)     Temp Source 07/01/21 1840 Oral     SpO2 07/01/21 1840 100 %     Weight --      Height --      Head Circumference --      Peak Flow --      Pain Score 07/01/21 1843 10     Pain Loc --      Pain Edu? --      Excl. in GC? --    No data found.  Updated Vital Signs BP 127/73   Pulse 93   Temp 98.7 F (37.1 C) (Oral)   Resp 16   LMP 06/04/2021 (Exact Date)   SpO2 100%   Breastfeeding No   Visual Acuity Right Eye Distance:   Left Eye Distance:   Bilateral Distance:    Right Eye Near:   Left Eye Near:    Bilateral Near:     Physical Exam Vitals reviewed.  Constitutional:      General: [name] is awake.     Appearance: Normal appearance. Jairus Martus Galindo is well-developed. Katelyn Braxton Kruszka is not ill-appearing.     Comments: Very pleasant patient appears stated age sitting in wheelchair in no acute distress  HENT:     Head: Normocephalic and atraumatic.     Mouth/Throat:     Pharynx: No oropharyngeal exudate, posterior oropharyngeal erythema or uvula swelling.  Cardiovascular:     Rate and Rhythm: Normal rate and regular rhythm.     Pulses:          Posterior tibial pulses are 2+ on the right side and 2+ on the left side.     Heart sounds: Normal heart sounds, S1 normal and S2 normal. No murmur heard.    Comments: Capillary  refill within 2 seconds left toes Pulmonary:     Effort: Pulmonary effort is normal.     Breath sounds: Normal breath sounds. No stridor. No wheezing, rhonchi or rales.     Comments: Clear to auscultation bilaterally Musculoskeletal:     Left foot: Decreased range of motion.  Swelling, tenderness and bony tenderness present. No deformity.     Comments: Foot neurovascularly intact.  Swelling and tenderness over dorsal left foot.  No deformity noted.  Neurological:     Mental Status: Nitai Conser Klecker is alert.  Psychiatric:        Behavior: Behavior is cooperative.     UC Treatments / Results  Labs (all labs ordered are listed, but only abnormal results are displayed) Labs Reviewed - No data to display  EKG   Radiology DG Foot Complete Left  Result Date: 07/01/2021 CLINICAL DATA:  Rolled left ankle, fall EXAM: LEFT FOOT - COMPLETE 3+ VIEW COMPARISON:  None. FINDINGS: Avulsion fracture noted along the anterior aspect of the navicular on the lateral view. No additional fracture. No subluxation or dislocation. Joint spaces maintained. Soft tissues are intact. IMPRESSION: Avulsion fracture off the anterior surface of the navicular. Electronically Signed   By: Charlett Nose M.D.   On: 07/01/2021 19:19    Procedures Procedures (including critical care time)  Medications Ordered in UC Medications - No data to display  Initial Impression / Assessment and Plan / UC Course  I have reviewed the triage vital signs and the nursing notes.  Pertinent labs & imaging results that were available during my care of the patient were reviewed by me and considered in my medical decision making (see chart for details).     X-ray obtained which showed avulsion fracture of the anterior surface of navicular bone.  Patient was placed in cam boot with strict instruction not to be weightbearing until seen by specialist and was given crutches with instruction how to use these during clinic visit.  Unfortunately, she is very limited in the medication that can be taken for pain relief.  She has successfully taken oxycodone with last prescription in January 2023.  Four tablets of this medication sent to pharmacy with instruction this is sedating and she should not drive or drink alcohol with  taking this medicine.  Discussed that she should not combine this with benzodiazepines she is chronically prescribed due to increased risk of sedation.  Discussed that if she has any worsening symptoms she needs to be seen immediately.  She is to follow-up with podiatry first and tomorrow was given contact information for local provider with instruction to call to schedule an appointment.  Strict return precautions given.  Work excuse note provided.  As nursing staff were placing patient in cam boot giving crutches patient expressed concern about occasional numbness and tingling sensation in foot.  Reevaluation of foot showed normal capillary refill within 2 seconds and normal sensation indicating foot is neurovascularly intact.  Discussed that intermittent mild paresthesias are likely related to swelling and discomfort.  Discussed signs/symptoms that would warrant going to the emergency room including blue discoloration, decreased capillary refill, persistent paresthesias.  Recommended follow-up as soon as possible with podiatry outpatient and encourage patient to call to schedule appointment first thing tomorrow.  All questions answered to patient satisfaction.  Final Clinical Impressions(s) / UC Diagnoses   Final diagnoses:  Closed nondisplaced fracture of navicular bone of left foot, initial encounter  Fall, initial encounter     Discharge Instructions  You have a fracture.  Please stay in cam boot until you are seen by specialist.  You should not put any weight on your foot and use crutches to get around until seen by specialist.  Please call to schedule appointment with podiatrist first thing tomorrow.  I have called in a couple doses of oxycodone for pain relief.  This can be sedating and you should not drive or drink alcohol taking it.  You should avoid use of this with your Xanax (alprazolam) as the combination can increase your risk of overdose and complications.  Keep your foot  elevated.  If you develop any increased pain, weakness, numbness you need to go to the emergency room.     ED Prescriptions     Medication Sig Dispense Auth. Provider   oxyCODONE-acetaminophen (PERCOCET/ROXICET) 5-325 MG tablet Take 1 tablet by mouth 2 (two) times daily as needed for up to 2 days for severe pain. 4 tablet Disaya Walt K, PA-C      I have reviewed the PDMP during this encounter.   Jeani Hawking, PA-C 07/01/21 1935    Jeani Hawking, PA-C 07/01/21 1942

## 2021-07-01 NOTE — ED Triage Notes (Addendum)
Pt reports moving objects while walking over large river rocks when he rolled left ankle causing a fall. .Now C/O left foot pain with swelling and difficulty bearing weight. Also c/o various areas of ecchymosis to BUE and BLE with a few abrasions, some lower abd pain. ?

## 2021-08-01 DIAGNOSIS — G35 Multiple sclerosis: Secondary | ICD-10-CM

## 2021-08-01 HISTORY — DX: Multiple sclerosis: G35

## 2021-09-23 ENCOUNTER — Ambulatory Visit
Admission: RE | Admit: 2021-09-23 | Discharge: 2021-09-23 | Disposition: A | Discharge status: Home / Self Care | Payer: BC Managed Care – PPO | Source: Ambulatory Visit | Attending: Physician Assistant | Admitting: Physician Assistant

## 2021-09-23 VITALS — BP 120/77 | HR 95 | Temp 97.9°F | Resp 18

## 2021-09-23 DIAGNOSIS — R051 Acute cough: Secondary | ICD-10-CM | POA: Diagnosis not present

## 2021-09-23 DIAGNOSIS — J069 Acute upper respiratory infection, unspecified: Secondary | ICD-10-CM

## 2021-09-23 MED ORDER — BENZONATATE 100 MG PO CAPS: 100.0000 mg | ORAL_CAPSULE | Freq: Three times a day (TID) | ORAL | 0 refills | Status: DC

## 2021-09-23 NOTE — Discharge Instructions (Signed)
We will contact you if you are positive for COVID and start Paxlovid as we discussed.  Use Tessalon for cough.  Use over-the-counter medications including Flonase, Mucinex, Tylenol.  Make sure you rest and drink plenty fluid.  If you have any worsening symptoms including chest pain, shortness of breath, high fever not responding to medication, nausea, vomiting you need to go to the emergency room.  Follow-up with your PCP next week to ensure improvement of symptoms.

## 2021-09-23 NOTE — ED Triage Notes (Signed)
Pt is present today with c/o cough, body aches. HA , and fatigue. Pt sx started today. PT recently exposed to covid

## 2021-09-23 NOTE — ED Provider Notes (Signed)
[facility]    CSN: [id number] Arrival date & time: 09/23/21  0941      History   Chief Complaint Chief Complaint  Patient presents with   Sore Throat    Entered by patient   Generalized Body Aches   Headache   Fatigue    HPI [name] is a 41 y.o. adult.   Patient presents today with 1 day history of URI symptoms.  Reports sore throat, body aches, headache, fatigue, cough.  Denies any chest pain, shortness of breath, nausea, vomiting, diarrhea.  Does report close contact with someone who is tested positive for COVID-19.  Patient has had COVID in the past with last episode in November 2022.  Has a history of long COVID.  Has had COVID-vaccine.  Tested positive at home.  Denies any recent steroid use.  Did take a course of antibiotics approximately 3 weeks ago.  Denies history of smoking.  Does have a history of MS and immunosuppression.  Has a history of asthma but has not required albuterol inhaler since symptom onset.  Patient has taken Paxlovid in the past and done well and is requesting this medication if positive for COVID.    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   Dislocation of patellofemoral joint 10/13/2017   Labor and delivery indication for care or intervention 02/15/2017   Pregnancy 01/01/2017   Encounter for procreative genetic counseling 54/62/7035   Umbilical hernia without obstruction and without gangrene    Norovirus 06/24/2016    ADHD (attention deficit hyperactivity disorder) 06/20/2016   Anxiety 06/20/2016   Asthma without status asthmaticus 06/20/2016   Depression 06/20/2016   Fibrocystic breast disease 06/20/2016   Fibromyalgia 06/20/2016   Migraine 06/20/2016   Ovarian cyst 06/20/2016   Morbid obesity ([place]) 02/24/2014   Headache 07/26/2013   KNEE PAIN, BILATERAL 01/02/2010   TENDINITIS, PATELLAR 01/02/2010    Past Surgical History:  Procedure Laterality Date   BUNIONECTOMY Bilateral    COLONOSCOPY WITH PROPOFOL N/A 08/19/2019   Procedure: COLONOSCOPY WITH PROPOFOL;  Surgeon: Lin Landsman, MD;  Location: [facility];  Service: Gastroenterology;  Laterality: N/A;   ESOPHAGOGASTRODUODENOSCOPY (EGD) WITH PROPOFOL N/A 08/19/2019   Procedure: ESOPHAGOGASTRODUODENOSCOPY (EGD) WITH PROPOFOL;  Surgeon: Lin Landsman, MD;  Location: [facility];  Service: Gastroenterology;  Laterality: N/A;   EYE SURGERY     blood clot in eye   HERNIA REPAIR  1991, 2001   inguinal and umbilical   LAPAROSCOPIC GASTRIC SLEEVE RESECTION  2015   TOTAL MASTECTOMY Bilateral     OB History     Gravida  2   Para      Term      Preterm      AB      Living  0      SAB  0   IAB      Ectopic  Multiple      Live Births               Home Medications    Prior to Admission medications   Medication Sig Start Date End Date Taking? Authorizing Provider  benzonatate (TESSALON) 100 MG capsule Take 1 capsule (100 mg total) by mouth every 8 (eight) hours. 09/23/21  Yes Violia Knopf K, PA-C  albuterol (VENTOLIN HFA) 108 (90 Base) MCG/ACT inhaler Inhale 1-2 puffs into the lungs every 4 (four) hours as needed for shortness of breath or wheezing.    [provider]  ALPRAZolam Duanne Moron) 0.5 MG tablet Take 0.5 mg by mouth daily as needed for anxiety. 02/09/21   [provider]  amphetamine-dextroamphetamine (ADDERALL) 20 MG tablet Take 20 mg by mouth 3 (three) times daily. 03/07/21    [provider]  B Complex-C (SUPER B COMPLEX/VITAMIN C PO) Take 1 tablet by mouth daily.     [provider]  BD DISP NEEDLES 25G X 5/8" MISC [place] Once a Week 09/29/19   [provider]  busPIRone (BUSPAR) 15 MG tablet Take 15 mg by mouth 4 (four) times daily. 10/09/20   [provider]  butalbital-acetaminophen-caffeine (FIORICET) 50-325-40 MG tablet Take 1 tablet by mouth every 6 (six) hours as needed for headache. 04/03/21   Masters, Joellen Jersey, DO  Calcium Carbonate (CALCIUM 500 PO) Take 500 mg by mouth 4 (four) times daily. chewable    [provider]  CALCIUM MAGNESIUM ZINC PO Take by mouth.    [provider]  Cholecalciferol (VITAMIN D3 PO) Take by mouth.    [provider]  CREON 36000-114000 units CPEP capsule Take 72,000 Units by mouth 3 (three) times daily with meals. 03/19/21   [provider]  diclofenac Sodium (VOLTAREN) 1 % GEL Apply topically 4 (four) times daily.    [provider]  dronabinol (MARINOL) 5 MG capsule Take 5 mg by mouth 2 (two) times daily as needed (abdominal pain). 06/24/19   [provider]  ergocalciferol (VITAMIN D2) 1.25 MG (50000 UT) capsule Take 1 capsule by mouth once a week. Patient not taking: Reported on 03/31/2021 03/28/21   [provider]  escitalopram (LEXAPRO) 20 MG tablet Take 20 mg by mouth daily. Patient not taking: Reported on 10/30/2020 10/09/20   [provider]  fluvoxaMINE (LUVOX) 50 MG tablet Take 50 mg by mouth 2 (two) times daily. Takes along with 25 to total 75 mg 03/20/21   [provider]  HYDROXYZINE HCL PO Take by mouth.    [provider]  metoprolol succinate (TOPROL-XL) 25 MG 24 hr tablet Take 25 mg by mouth daily. 03/21/21   [provider]  Multiple Vitamin (MULTIVITAMIN PO) Take by mouth.    [provider]  Multiple Vitamins-Minerals (ADEK GUMMIES PLUS ZN PO) Take 1 tablet by mouth daily.     [provider]  neomycin-polymyxin b-dexamethasone (MAXITROL) 3.5-10000-0.1 SUSP Place 1 drop into both eyes daily as needed (dryness/inflammation). Patient not taking: Reported on 03/31/2021    [provider]  ondansetron (ZOFRAN-ODT) 4 MG disintegrating tablet Take 4 mg by mouth every 8 (eight) hours as needed for vomiting or nausea. 04/10/19   [provider]  OVER THE COUNTER MEDICATION Take 1 capsule by mouth daily. Lypoic acid    [provider]  pantoprazole (PROTONIX) 40 MG tablet Take 40 mg by mouth daily.    [provider]  testosterone cypionate (DEPOTESTOSTERONE CYPIONATE) 200 MG/ML injection Inject into  the skin every Saturday. Taken 0.53m 09/29/19   [provider]  UNABLE TO FIND Med Name: capaxone inj 3 times/wk    [provider]  vitamin E 1000 UNIT capsule Take 1,000 Units by mouth daily.    [provider]  zinc gluconate 50 MG tablet Take 50 mg by mouth in the morning and at bedtime.    [provider]  zolmitriptan (ZOMIG-ZMT) 2.5 MG disintegrating tablet Take 2.5 mg by mouth daily as needed for migraine. 03/24/19   [provider]    Family History Family History  Problem Relation Age of Onset   Breast cancer Paternal A54   Breast cancer Paternal A24   Healthy Mother    CVA Father    Hypertension Father    Diabetes Father    Breast cancer Paternal Grandmother     Social History Social History   Tobacco Use   Smoking status: Former    Types: Cigarettes    Quit date: 06/21/2003    Years since quitting: 18.2   Smokeless tobacco: Never  Vaping Use   Vaping Use: Never used  Substance Use Topics   Alcohol use: No   Drug use: No     Allergies   Penicillins, Remdesivir, Chlorhexidine gluconate, Clonazepam, Dexamethasone, Doxycycline, Fluoxetine, Nsaids, Other, Tolmetin, Hydrocodone-acetaminophen, Tape, and Tuberculin ppd   Review of Systems Review of Systems   Constitutional:  Positive for activity change. Negative for appetite change, fatigue and fever.  HENT:  Positive for congestion and sore throat. Negative for sinus pressure and sneezing.   Respiratory:  Positive for cough. Negative for shortness of breath.   Cardiovascular:  Negative for chest pain.  Gastrointestinal:  Negative for abdominal pain, diarrhea, nausea and vomiting.  Neurological:  Negative for dizziness, light-headedness and headaches.     Physical Exam Triage Vital Signs ED Triage Vitals  Enc Vitals Group     BP 09/23/21 1108 120/77     Pulse Rate 09/23/21 1108 95     Resp 09/23/21 1108 18     Temp 09/23/21 1108 97.9 F (36.6 C)     Temp src --      SpO2 09/23/21 1108 98 %     Weight --      Height --      Head Circumference --      Peak Flow --      Pain Score 09/23/21 1107 3     Pain Loc --      Pain Edu? --      Excl. in [place] --    No data found.  Updated Vital Signs BP 120/77   Pulse 95   Temp 97.9 F (36.6 C)   Resp 18   SpO2 98%   Visual Acuity Right Eye Distance:   Left Eye Distance:   Bilateral Distance:    Right Eye Near:   Left Eye Near:    Bilateral Near:     Physical Exam Vitals reviewed.  Constitutional:      General: SPrentissis awake. SDeondrae McgrailKey is not in acute distress.    Appearance: Normal appearance. SQusay VilladaKey is well-developed. SReynard ChristoffersenKey is not ill-appearing.     Comments: Very pleasant male appears stated age in no acute distress sitting comfortably in exam room  HENT:     Head: Normocephalic and atraumatic.     Right Ear: Ear canal and external ear normal. A middle ear effusion is present. Tympanic membrane  is not erythematous or bulging.     Left Ear: Tympanic membrane, ear canal and external ear normal.  No middle ear effusion. Tympanic membrane is not erythematous or bulging.     Nose:     Right Sinus: Maxillary sinus tenderness present. No frontal sinus tenderness.     Left Sinus: Maxillary sinus  tenderness present. No frontal sinus tenderness.     Mouth/Throat:     Pharynx: Uvula midline. No oropharyngeal exudate or posterior oropharyngeal erythema.  Cardiovascular:     Rate and Rhythm: Normal rate and regular rhythm.     Heart sounds: Normal heart sounds, S1 normal and S2 normal. No murmur heard. Pulmonary:     Effort: Pulmonary effort is normal.     Breath sounds: Normal breath sounds. No wheezing, rhonchi or rales.     Comments: Clear to auscultation bilaterally Psychiatric:        Behavior: Behavior is cooperative.      UC Treatments / Results  Labs (all labs ordered are listed, but only abnormal results are displayed) Labs Reviewed  NOVEL CORONAVIRUS, NAA    EKG   Radiology No results found.  Procedures Procedures (including critical care time)  Medications Ordered in UC Medications - No data to display  Initial Impression / Assessment and Plan / UC Course  I have reviewed the triage vital signs and the nursing notes.  Pertinent labs & imaging results that were available during my care of the patient were reviewed by me and considered in my medical decision making (see chart for details).     Suspect COVID given known exposure.  COVID testing was obtained and is pending today.  Discussed that if it is positive patient will be a candidate for Paxlovid given past medical history of asthma and immunosuppression related to MS.  Patient had kidney function obtained 05/18/2021 in care everywhere with creatinine of 0.9 and calculated creatinine clearance of 120.58 mL/min.  Patient was given Tessalon for cough.  Recommended over-the-counter medications including Tylenol and ibuprofen.  Patient is to rest and drink plenty of fluid.  Discussed that if they have any worsening symptoms including high fever not responding to medication, chest pain, shortness of breath, nausea/vomiting interfering with oral intake needs to be seen immediately.  Final Clinical Impressions(s)  / UC Diagnoses   Final diagnoses:  Upper respiratory tract infection, unspecified type  Acute cough     Discharge Instructions      We will contact you if you are positive for COVID and start Paxlovid as we discussed.  Use Tessalon for cough.  Use over-the-counter medications including Flonase, Mucinex, Tylenol.  Make sure you rest and drink plenty fluid.  If you have any worsening symptoms including chest pain, shortness of breath, high fever not responding to medication, nausea, vomiting you need to go to the emergency room.  Follow-up with your PCP next week to ensure improvement of symptoms.     ED Prescriptions     Medication Sig Dispense Auth. Provider   benzonatate (TESSALON) 100 MG capsule Take 1 capsule (100 mg total) by mouth every 8 (eight) hours. 21 capsule Aashna Matson K, PA-C      PDMP not reviewed this encounter.   Terrilee Croak, PA-C 09/23/21 1126

## 2021-09-24 LAB — NOVEL CORONAVIRUS, NAA: SARS-CoV-2, NAA: DETECTED — AB

## 2021-09-25 ENCOUNTER — Telehealth (HOSPITAL_COMMUNITY): Payer: Self-pay | Admitting: Emergency Medicine

## 2021-09-25 MED ORDER — NIRMATRELVIR/RITONAVIR (PAXLOVID)TABLET: 3.0000 | ORAL_TABLET | Freq: Two times a day (BID) | ORAL | 0 refills | Status: AC

## 2021-09-25 MED ORDER — PROMETHAZINE-DM 6.25-15 MG/5ML PO SYRP: 5.0000 mL | ORAL_SOLUTION | Freq: Three times a day (TID) | ORAL | 0 refills | Status: AC | PRN

## 2022-01-01 DIAGNOSIS — R7303 Prediabetes: Secondary | ICD-10-CM | POA: Insufficient documentation

## 2022-01-01 HISTORY — DX: Prediabetes: R73.03

## 2022-01-07 ENCOUNTER — Encounter (INDEPENDENT_AMBULATORY_CARE_PROVIDER_SITE_OTHER): Payer: Self-pay

## 2022-01-31 ENCOUNTER — Emergency Department: Payer: BC Managed Care – PPO

## 2022-01-31 ENCOUNTER — Encounter: Payer: Self-pay | Admitting: Emergency Medicine

## 2022-01-31 ENCOUNTER — Emergency Department
Admission: EM | Admit: 2022-01-31 | Discharge: 2022-01-31 | Disposition: A | Discharge status: Home / Self Care | Payer: BC Managed Care – PPO | Attending: Emergency Medicine | Admitting: Emergency Medicine

## 2022-01-31 DIAGNOSIS — M2391 Unspecified internal derangement of right knee: Secondary | ICD-10-CM | POA: Diagnosis present

## 2022-01-31 MED ORDER — TRAMADOL HCL 50 MG PO TABS: 50.0000 mg | ORAL_TABLET | Freq: Four times a day (QID) | ORAL | 0 refills | Status: DC | PRN

## 2022-01-31 NOTE — ED Provider Notes (Signed)
[facility] Provider Note    Event Date/Time   First MD Initiated Contact with Patient 01/31/22 0502     (approximate)   History   Knee Injury   HPI  [name] is a 41 y.o. adult was sitting to days ago and felt his knee pop.  Since then has become increasingly painful and swollen.  Patient has a past history of posterior cruciate ligament tear.  He has a custom brace.  He is not seeing anybody currently orthopedic wise.      Physical Exam   Triage Vital Signs: ED Triage Vitals  Enc Vitals Group     BP 01/31/22 0347 (!) 135/92     Pulse Rate 01/31/22 0347 88     Resp 01/31/22 0347 18     Temp 01/31/22 0347 98.4 F (36.9 C)     Temp Source 01/31/22 0347 Oral     SpO2 01/31/22 0347 100 %     Weight 01/31/22 0348 183 lb (83 kg)     Height 01/31/22 0348 '5\' 10"'$  (1.778 m)     Head Circumference --      Peak Flow --      Pain Score --      Pain Loc --      Pain Edu? --      Excl. in [place]? --     Most recent vital signs: Vitals:   01/31/22 0347  BP: (!) 135/92  Pulse: 88  Resp: 18  Temp: 98.4 F (36.9 C)  SpO2: 100%     General: Awake, no distress.  CV:  Good peripheral perfusion.  Resp:  Normal effort.  Abd:  No distention.  Right knee is swollen there is a joint effusion.  The knee is somewhat tender is not hot or red.  Knee is stable.  I do not feel any pops or clicks when I test the cartilages but patient reports he feels pain when I flex his knee externally rotate the foot and extend the knee.   ED Results / Procedures / Treatments   Labs (all labs ordered are listed, but only abnormal results are displayed) Labs Reviewed - No data to display   EKG     RADIOLOGY X-ray read by radiology reviewed and interpreted by me shows possibly a small avulsion fracture from the patella.  This could happen if the patella did dislocate.  The bone fragment appears to be fairly well-corticated when I look at it.  There is 1 spot where  there could be a fragment off of this little bone fragment.  PROCEDURES:  Critical Care performed:   Procedures   MEDICATIONS ORDERED IN ED: Medications - No data to display   IMPRESSION / MDM / [place] / ED COURSE  I reviewed the triage vital signs and the nursing notes.   Differential diagnosis includes, but is not limited to, cruciate ligament tear or patellar dislocation are possibilities patient was worried about a patellar dislocation but the patella is not dislocated clinically or on x-ray.  Patient's presentation is most consistent with acute complicated illness / injury requiring diagnostic workup.      FINAL CLINICAL IMPRESSION(S) / ED DIAGNOSES   Final diagnoses:  Internal derangement of right knee     Rx / DC Orders   ED Discharge Orders     None        Note:  This document was prepared using Dragon voice recognition software and may include unintentional  dictation errors.   Nena Polio, MD 01/31/22 [phone]

## 2022-01-31 NOTE — Discharge Instructions (Addendum)
Please use ice or heat on your knee whichever makes it feel better.  In order to wrap the ice in a towel and only applied for about 20 minutes.  Avoid frostbite.  Wear your knee brace.  Use crutches or a walker if need be.  Please follow-up with orthopedics.  Dr. Karel Jarvis is on-call.  Please give his office a call on Friday and try to arrange follow-up in the next week or so.  Office may not be open till Monday, I am not sure.

## 2022-01-31 NOTE — ED Triage Notes (Signed)
Pt reports he heard a "pop" in the right knee on Monday and since has had pain, swelling and reports can feel knee cap moving. Pt has had dislocation in same knee in past.

## 2022-01-31 NOTE — ED Notes (Signed)
Patient verbalizes understanding of discharge instructions. Opportunity for questioning and answers were provided. Armband removed by staff, pt discharged from ED. Ambulated out to lobby  

## 2022-02-10 DIAGNOSIS — J029 Acute pharyngitis, unspecified: Secondary | ICD-10-CM | POA: Diagnosis not present

## 2022-02-10 DIAGNOSIS — R519 Headache, unspecified: Secondary | ICD-10-CM | POA: Diagnosis present

## 2022-02-10 DIAGNOSIS — M791 Myalgia, unspecified site: Secondary | ICD-10-CM | POA: Insufficient documentation

## 2022-02-10 DIAGNOSIS — R0981 Nasal congestion: Secondary | ICD-10-CM | POA: Diagnosis not present

## 2022-02-10 DIAGNOSIS — Z1152 Encounter for screening for COVID-19: Secondary | ICD-10-CM | POA: Diagnosis not present

## 2022-02-10 DIAGNOSIS — Z5321 Procedure and treatment not carried out due to patient leaving prior to being seen by health care provider: Secondary | ICD-10-CM | POA: Insufficient documentation

## 2022-02-10 NOTE — ED Triage Notes (Signed)
Pt reports flu like s/s, headache, bodyaches, sore throat, nasal congestion, denies cough, sob, or CP. Pt reports I hurt all over Took Tramadol and Tylenol PTA. VSS, afebrile.

## 2022-02-11 ENCOUNTER — Emergency Department
Admission: EM | Admit: 2022-02-11 | Discharge: 2022-02-11 | Discharge status: Against Medical Advice | Payer: BC Managed Care – PPO | Attending: Physician Assistant | Admitting: Physician Assistant

## 2022-02-11 LAB — RESP PANEL BY RT-PCR (FLU A&B, COVID) ARPGX2
Influenza A by PCR: NEGATIVE
Influenza B by PCR: NEGATIVE
SARS Coronavirus 2 by RT PCR: NEGATIVE

## 2022-02-11 LAB — GROUP A STREP BY PCR: Group A Strep by PCR: NOT DETECTED

## 2022-02-14 ENCOUNTER — Ambulatory Visit: Payer: BC Managed Care – PPO

## 2022-02-14 NOTE — Therapy (Incomplete)
OUTPATIENT PHYSICAL THERAPY NEURO EVALUATION   Patient Name: [name] MRN: [id number] DOB:[birth year], 41 y.o., adult Today's Date: 02/14/2022  PCP: Marinda Elk REFERRING PROVIDER: PCP  END OF SESSION:  Past Medical History:  Diagnosis Date   Asthma    Breast cyst    Breast mass    Chronic fatigue syndrome    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[name])    Depression    Encephalomyelitis    Fibrocystic breast    Fibromyalgia    Long COVID    Migraines    MS (multiple sclerosis) ([place])    Past Surgical History:  Procedure Laterality Date   BUNIONECTOMY Bilateral    COLONOSCOPY WITH PROPOFOL N/A 08/19/2019   Procedure: COLONOSCOPY WITH PROPOFOL;  Surgeon: Lin Landsman, MD;  Location: [facility] ENDOSCOPY;  Service: Gastroenterology;  Laterality: N/A;   ESOPHAGOGASTRODUODENOSCOPY (EGD) WITH PROPOFOL N/A 08/19/2019   Procedure: ESOPHAGOGASTRODUODENOSCOPY (EGD) WITH PROPOFOL;  Surgeon: Lin Landsman, MD;  Location: [facility] ENDOSCOPY;  Service: Gastroenterology;  Laterality: N/A;   EYE SURGERY     blood clot in eye   HERNIA REPAIR  1991, 2001   inguinal and umbilical   LAPAROSCOPIC GASTRIC SLEEVE RESECTION  2015   TOTAL MASTECTOMY Bilateral    Patient Active Problem List   Diagnosis Date Noted   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   Borderline personality disorder ([name]) 09/21/2020   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/2018   Dislocation of patellofemoral joint 10/13/2017   Labor and delivery indication for care or intervention 02/15/2017   Pregnancy 01/01/2017   Encounter for procreative genetic counseling 72/62/0355   Umbilical hernia without obstruction and without gangrene    Norovirus 06/24/2016   ADHD (attention deficit hyperactivity disorder) 06/20/2016   Anxiety 06/20/2016   Asthma without status asthmaticus 06/20/2016   Depression 06/20/2016   Fibrocystic breast disease  06/20/2016   Fibromyalgia 06/20/2016   Migraine 06/20/2016   Ovarian cyst 06/20/2016   Morbid obesity ([name]) 02/24/2014   Headache 07/26/2013   KNEE PAIN, BILATERAL 01/02/2010   TENDINITIS, PATELLAR 01/02/2010    ONSET DATE: August 24th, 2023  REFERRING DIAG:  G35 (ICD-10-CM) - Multiple sclerosis   THERAPY DIAG:  No diagnosis found.  Rationale for Evaluation and Treatment: Rehabilitation  SUBJECTIVE:                                                                                                                                                                                             SUBJECTIVE STATEMENT:  Patient has not been able to return to walk since February 2022.  Pt accompanied by: {accompnied:27141}  PERTINENT HISTORY:   Patient is a 41 y/o who is coming to skilled therapy for treatment of multiple sclerosis.   PAIN:  Are you having pain? Yes: NPRS scale: ***/10 Pain location: *** Pain description: *** Aggravating factors: *** Relieving factors: ***  PRECAUTIONS: {Therapy precautions:24002}  WEIGHT BEARING RESTRICTIONS: {Yes ***/No:24003}  FALLS: Has patient fallen in last 6 months? {fallsyesno:27318}  LIVING ENVIRONMENT: Lives with: {OPRC lives with:25569::"lives with their family"} Lives in: {Lives in:25570} Stairs: {opstairs:27293} Has following equipment at home: {Assistive devices:23999}  PLOF: {PLOF:24004}  PATIENT GOALS: ***  OBJECTIVE:   DIAGNOSTIC FINDINGS:  IMPRESSION: (DG Knee Complete 4 Views Right - 02/01/23)  1. Moderate suprapatellar joint effusion. 2. Ossific density overlying the medial femoral condyle with overlying soft tissue swelling. In the setting of recent patellar dislocation findings may reflect an avulsion injury or osteochondral defect. Donor site is not apparent on the current imaging. Consider more definitive characterization with MRI or CT of the right knee.  August 24th, 2023 1. Clinically isolated syndrome,  per [facility], in a patient with minimally abnormal MRI Brain with single non-enhancing lesion in right frontal subcortical periventricular area (?radiologically isolated syndrome), possible T6 lesion (which was hard to see for me), slight elevation in CSF IgG index (1.0) but 2 CSF oligoclonal bands with 0 serum oligoclonal bands and normal CSF protein, with family history of MS in paternal first cousin, long COVID, history of fibromyalgia  COGNITION: Overall cognitive status: {cognition:24006}   SENSATION: {sensation:27233}  COORDINATION: ***  EDEMA:  {edema:24020}  MUSCLE TONE: {LE tone:25568}  MUSCLE LENGTH: Hamstrings: Right *** deg; Left *** deg Thomas test: Right *** deg; Left *** deg  DTRs:  {DTR SITE:24025}  POSTURE: {posture:25561}  LOWER EXTREMITY ROM:     {AROM/PROM:27142}  Right Eval Left Eval  Hip flexion    Hip extension    Hip abduction    Hip adduction    Hip internal rotation    Hip external rotation    Knee flexion    Knee extension    Ankle dorsiflexion    Ankle plantarflexion    Ankle inversion    Ankle eversion     (Blank rows = not tested)  LOWER EXTREMITY MMT:    MMT Right Eval Left Eval  Hip flexion    Hip extension    Hip abduction    Hip adduction    Hip internal rotation    Hip external rotation    Knee flexion    Knee extension    Ankle dorsiflexion    Ankle plantarflexion    Ankle inversion    Ankle eversion    (Blank rows = not tested)  BED MOBILITY:  {Bed mobility:24027}  TRANSFERS: Assistive device utilized: {Assistive devices:23999}  Sit to stand: {Levels of assistance:24026} Stand to sit: {Levels of assistance:24026} Chair to chair: {Levels of assistance:24026} Floor: {Levels of assistance:24026}  RAMP:  Level of Assistance: {Levels of assistance:24026} Assistive device utilized: {Assistive devices:23999} Ramp Comments: ***  CURB:  Level of Assistance: {Levels of assistance:24026} Assistive device  utilized: {Assistive devices:23999} Curb Comments: ***  STAIRS: Level of Assistance: {Levels of assistance:24026} Stair Negotiation Technique: {Stair Technique:27161} with {Rail Assistance:27162} Number of Stairs: ***  Height of Stairs: ***  Comments: ***  GAIT: Gait pattern: {gait characteristics:25376} Distance walked: *** Assistive device utilized: {Assistive devices:23999} Level of assistance: {Levels of assistance:24026} Comments: ***  FUNCTIONAL TESTS:  5 times sit to stand: *** Timed up and go (TUG): *** 6  minute walk test: *** Berg Balance Scale: *** Functional gait assessment: ***  PATIENT SURVEYS:  FOTO ***  TODAY'S TREATMENT:                                                                                                                              DATE: 02/14/2022   PATIENT EDUCATION: Education details: *** Person educated: {Person educated:25204} Education method: {Education Method:25205} Education comprehension: {Education Comprehension:25206}  HOME EXERCISE PROGRAM: Create HEP for patient and check for patient understanding.   GOALS: Goals reviewed with patient? Yes  SHORT TERM GOALS: Target date: ***  *** Baseline: Goal status: {GOALSTATUS:25110}  2.  *** Baseline:  Goal status: {GOALSTATUS:25110}  3.  *** Baseline:  Goal status: {GOALSTATUS:25110}  4.  *** Baseline:  Goal status: {GOALSTATUS:25110}  5.  *** Baseline:  Goal status: {GOALSTATUS:25110}  6.  *** Baseline:  Goal status: {GOALSTATUS:25110}  LONG TERM GOALS: Target date: ***  *** Baseline:  Goal status: {GOALSTATUS:25110}  2.  *** Baseline:  Goal status: {GOALSTATUS:25110}  3.  *** Baseline:  Goal status: {GOALSTATUS:25110}  4.  *** Baseline:  Goal status: {GOALSTATUS:25110}  5.  *** Baseline:  Goal status: {GOALSTATUS:25110}  6.  *** Baseline:  Goal status: {GOALSTATUS:25110}  ASSESSMENT:  CLINICAL IMPRESSION: Patient is a *** y.o. *** who was  seen today for physical therapy evaluation and treatment for ***.   OBJECTIVE IMPAIRMENTS: {opptimpairments:25111}.   ACTIVITY LIMITATIONS: {activitylimitations:27494}  PARTICIPATION LIMITATIONS: {participationrestrictions:25113}  PERSONAL FACTORS: {Personal factors:25162} are also affecting patient's functional outcome.   REHAB POTENTIAL: {rehabpotential:25112}  CLINICAL DECISION MAKING: {clinical decision making:25114}  EVALUATION COMPLEXITY: {Evaluation complexity:25115}  PLAN:  PT FREQUENCY: {rehab frequency:25116}  PT DURATION: {rehab duration:25117}  PLANNED INTERVENTIONS: {rehab planned interventions:25118::"Therapeutic exercises","Therapeutic activity","Neuromuscular re-education","Balance training","Gait training","Patient/Family education","Self Care","Joint mobilization"}  PLAN FOR NEXT SESSION: Create HEP for patient and check for patient understanding.    Sudie Bailey, Student-PT 02/14/2022, 8:22 AM

## 2022-02-19 ENCOUNTER — Ambulatory Visit: Payer: BC Managed Care – PPO | Admitting: Physical Therapy

## 2022-02-21 ENCOUNTER — Ambulatory Visit: Payer: BC Managed Care – PPO | Attending: Physician Assistant

## 2022-02-21 ENCOUNTER — Ambulatory Visit: Payer: BC Managed Care – PPO | Admitting: Physical Therapy

## 2022-02-21 DIAGNOSIS — R41841 Cognitive communication deficit: Secondary | ICD-10-CM | POA: Diagnosis present

## 2022-02-21 DIAGNOSIS — M6281 Muscle weakness (generalized): Secondary | ICD-10-CM | POA: Diagnosis present

## 2022-02-21 DIAGNOSIS — U099 Post covid-19 condition, unspecified: Secondary | ICD-10-CM | POA: Insufficient documentation

## 2022-02-21 DIAGNOSIS — F419 Anxiety disorder, unspecified: Secondary | ICD-10-CM | POA: Insufficient documentation

## 2022-02-21 DIAGNOSIS — R278 Other lack of coordination: Secondary | ICD-10-CM | POA: Diagnosis present

## 2022-02-21 DIAGNOSIS — R29898 Other symptoms and signs involving the musculoskeletal system: Secondary | ICD-10-CM | POA: Insufficient documentation

## 2022-02-21 DIAGNOSIS — G35 Multiple sclerosis: Secondary | ICD-10-CM | POA: Diagnosis present

## 2022-02-21 NOTE — Therapy (Addendum)
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[place])    Depression    Encephalomyelitis    Fibrocystic breast    Fibromyalgia    Long COVID    Migraines    MS (multiple sclerosis) ([place])    Past Surgical History:  Procedure Laterality Date   BUNIONECTOMY Bilateral    COLONOSCOPY WITH PROPOFOL N/A 08/19/2019   Procedure: COLONOSCOPY WITH PROPOFOL;  Surgeon: Lin Landsman, MD;  Location: [facility] ENDOSCOPY;  Service: Gastroenterology;  Laterality: N/A;   ESOPHAGOGASTRODUODENOSCOPY (EGD) WITH PROPOFOL N/A 08/19/2019   Procedure: ESOPHAGOGASTRODUODENOSCOPY (EGD) WITH PROPOFOL;  Surgeon: Lin Landsman, MD;  Location: [facility] ENDOSCOPY;  Service: Gastroenterology;  Laterality: N/A;   EYE SURGERY     blood clot in eye   HERNIA REPAIR  1991, 2001   inguinal and umbilical   LAPAROSCOPIC GASTRIC SLEEVE RESECTION  2015   TOTAL MASTECTOMY Bilateral    Patient Active Problem List   Diagnosis Date Noted   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   Borderline personality disorder ([place]) 09/21/2020   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/2018   Dislocation of patellofemoral joint 10/13/2017   Labor and delivery indication for care or intervention 02/15/2017   Pregnancy 01/01/2017   Encounter for procreative genetic counseling 89/21/1941   Umbilical hernia without obstruction and without gangrene    Norovirus 06/24/2016   ADHD (attention deficit hyperactivity disorder) 06/20/2016   Anxiety 06/20/2016   Asthma without status asthmaticus 06/20/2016   Depression 06/20/2016    Fibrocystic breast disease 06/20/2016   Fibromyalgia 06/20/2016   Migraine 06/20/2016   Ovarian cyst 06/20/2016   Morbid obesity ([place]) 02/24/2014   Headache 07/26/2013   KNEE PAIN, BILATERAL 01/02/2010   TENDINITIS, PATELLAR 01/02/2010    ONSET DATE: April 2023  REFERRING DIAG:  G35 (ICD-10-CM) - Multiple sclerosis   THERAPY DIAG:  No diagnosis found.  Rationale for Evaluation and Treatment: Rehabilitation  SUBJECTIVE:                                                                                                                                                                                             SUBJECTIVE STATEMENT:  Pt with lengthy subjective history of multiple complications associated  with this evaluation.  Pt notes that they are currently having complication from [place] from the 3rd and last time they were diagnosed back in December 2022.  Pt notes that their aphasia has recently been difficult due to being diagnosed with RSV last week and ever since then, has had the aphasia and is much worse today.  Pt utilizing cell phone to communicate with therapist at initial portion of the session, however after anxiety levels lower, pt is able to communicate more effectively.  Pt notes that their speech is more difficult because their thoughts.word finding is "fuzzy" at this time.  Pt reports that they are tri-lingual and can speak [place], [place], and Sign.  Pt notes no complications with signing and a method of communicating more effectively.    Pt also confirms PCL rupture and is wearing custom DonJoy knee brace on the R LE to assist with stability as they have experienced a patellar dislocation as well.    Pt notes that during a spinal tap in January, the concluded that the pt would need to have an MRI as well and that is when they were diagnosed with MS.  Pt is coming to therapy for all complications listed above.  Pt accompanied by: self  PERTINENT HISTORY: see previous    PAIN: Are you having pain? Yes: NPRS scale: 4/10 Pain location: Feet and Legs, R>L Pain description: Tingling and Cold (Always Cold) Aggravating factors: Walking, running, jumping Relieving factors: Heat  PRECAUTIONS: None  WEIGHT BEARING RESTRICTIONS: No  FALLS: Has patient fallen in last 6 months? No; last fall in April and broke L foot.  LIVING ENVIRONMENT: Lives with: lives with their family Lives in: House/apartment Stairs: Yes: Internal: 13 steps; on right going up Has following equipment at home:  DonJoy Custom R Knee Brace  PLOF: Independent  PATIENT GOALS: Strengthening due to losing a lot of atrophy.  Pt had weight loss surgery 2x.  Pt states the MD is wanting pt to increase the stability of the R knee.  Pt wants to be able to walk without pain.  OBJECTIVE:   DIAGNOSTIC FINDINGS:   EXAM: RIGHT KNEE - COMPLETE 4+ VIEW  IMPRESSION: 1. Moderate suprapatellar joint effusion. 2. Ossific density overlying the medial femoral condyle with overlying soft tissue swelling. In the setting of recent patellar dislocation findings may reflect an avulsion injury or osteochondral defect. Donor site is not apparent on the current imaging. Consider more definitive characterization with MRI or CT of the right knee.   COGNITION: Overall cognitive status: Within functional limits for tasks assessed and Pt does have hx of aphasia at times.   SENSATION: WFL  COORDINATION: WFL  POSTURE: No Significant postural limitations  LOWER EXTREMITY MMT:    MMT Right Eval Left Eval  Hip flexion 4+ 5  Hip extension 4+ 5  Hip abduction 4+ 5  Hip adduction 4+ 5  Hip internal rotation    Hip external rotation    Knee flexion 4 5  Knee extension 4+ 5  Ankle dorsiflexion 4+ 5  Ankle plantarflexion    Ankle inversion    Ankle eversion    (Blank rows = not tested)   GAIT: Gait pattern: WFL Distance walked: 40' Assistive device utilized: None; DonJoy brace donned on the R  LE for stability purposes Level of assistance: Complete Independence Comments: Stiffness noted in the R LE due to the brace, but otherwise WNL.  FUNCTIONAL TESTS:  5 times sit to stand: 12.83 sec Timed up and  go (TUG): Assess at next visit. 6 minute walk test: Assess at next visit. Dynamic Gait Index: Assess at next visit.  PATIENT SURVEYS:  FOTO 36/46  TODAY'S TREATMENT: DATE: 02/21/22    Eval Only   PATIENT EDUCATION: Education details: Pt educated on role of PT and services provided during current POC, along with prognosis and information about the clinic. Person educated: Patient Education method: Explanation Education comprehension: verbalized understanding  HOME EXERCISE PROGRAM:  Deferred to next session due to time constraints.   GOALS:  Goals reviewed with patient? Yes  SHORT TERM GOALS: Target date: 03/21/2022  Pt will be independent with HEP in order to demonstrate increased ability to perform tasks related to occupation/hobbies. Baseline: FOTO: Goal status: INITIAL   LONG TERM GOALS: Target date: 05/16/2022  1.  Patient will complete five times sit to stand test in < 10 seconds indicating an increased LE strength and improved balance. Baseline: 12.83 sec Goal status: INITIAL  2.  Patient will increase FOTO score to equal to or greater than 46 to demonstrate statistically significant improvement in mobility and quality of life.  Baseline: 36 Goal status: INITIAL   3.  Patient will increase FGA score by > 4 points to demonstrate decreased fall risk during functional activities. Baseline: Not performed at initial evaluation. Goal status: INITIAL   4.  Patient will reduce timed up and go to <11 seconds to reduce fall risk and demonstrate improved transfer/gait ability. Baseline: Not performed at initial evaluation. Goal status: INITIAL  5.  Patient will increase 10 meter walk test to >1.87ms as to improve gait speed for better community ambulation and  to reduce fall risk. Baseline: Not performed at initial evaluation. Goal status: INITIAL  6.  Patient will increase six minute walk test distance to >1000 for progression to community ambulator and improve gait ability Baseline: Not performed at initial evaluation. Goal status: INITIAL    ASSESSMENT:  CLINICAL IMPRESSION: Patient is 41y.o. seen today for physical therapy evaluation and treatment for multiple sclerosis.  Pt presents with physical impairments of decreased activity tolerance, increased pain in B LE with R>L, and decreased strength in R LE as noted.  Pt also demonstrating decreased balance at this time due to the MS and instability in the R knee due to the PCL rupture.  Pt noted to be short of breathe and having difficulty with aphasia due to complications with Long Covid/MS complications.  Pt will benefit from skilled therapy to address tolerance, balance, pain, and strength impairments necessary for improvement in quality of life.  Pt. demonstrates understanding of this plan of care and agrees with this plan      OBJECTIVE IMPAIRMENTS:  Abnormal gait, decreased activity tolerance, decreased balance, decreased endurance, decreased knowledge of condition, decreased mobility, difficulty walking, decreased strength, dizziness, impaired sensation, and pain.   ACTIVITY LIMITATIONS: lifting, bending, sitting, standing, squatting, sleeping, stairs, and locomotion level  PARTICIPATION LIMITATIONS: cleaning, laundry, interpersonal relationship, community activity, and occupation  PERSONAL FACTORS: Age, Education, Past/current experiences, Time since onset of injury/illness/exacerbation, and 3+ comorbidities: MS, Depression, Fibromyalgia, Rupture of PCL  are also affecting patient's functional outcome.   REHAB POTENTIAL: Good  CLINICAL DECISION MAKING: Stable/uncomplicated  EVALUATION COMPLEXITY: High  PLAN:  PT FREQUENCY: 2x/week  PT DURATION: 12 weeks  PLANNED  INTERVENTIONS: Therapeutic exercises, Therapeutic activity, Neuromuscular re-education, Balance training, Gait training, Patient/Family education, Self Care, Joint mobilization, Stair training, Vestibular training, Canalith repositioning, Dry Needling, Electrical stimulation, Spinal manipulation, Spinal mobilization, Cryotherapy, Moist heat, Traction,  and Manual therapy  PLAN FOR NEXT SESSION:   Perform balance related testing including but not limited to: CTSIB-Modified DGI 10 meter walk test 6MWT TUG Knee ROM    Gwenlyn Saran, PT, DPT Physical Therapist- [facility]  02/21/22, 4:12 PM

## 2022-02-26 ENCOUNTER — Ambulatory Visit: Payer: BC Managed Care – PPO | Admitting: Physical Therapy

## 2022-02-26 NOTE — Therapy (Deleted)
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[place])    Depression    Encephalomyelitis    Fibrocystic breast    Fibromyalgia    Long COVID    Migraines    MS (multiple sclerosis) ([place])    Past Surgical History:  Procedure Laterality Date   BUNIONECTOMY Bilateral    COLONOSCOPY WITH PROPOFOL N/A 08/19/2019   Procedure: COLONOSCOPY WITH PROPOFOL;  Surgeon: Lin Landsman, MD;  Location: [facility] ENDOSCOPY;  Service: Gastroenterology;  Laterality: N/A;   ESOPHAGOGASTRODUODENOSCOPY (EGD) WITH PROPOFOL N/A 08/19/2019   Procedure: ESOPHAGOGASTRODUODENOSCOPY (EGD) WITH PROPOFOL;  Surgeon: Lin Landsman, MD;  Location: [facility] END[facility]COPY;  Service: Gastroenterology;  Laterality: N/A;   EYE SURGERY     blood clot in eye   HERNIA REPAIR  1991, 2001   inguinal and umbilical   LAPAROSCOPIC GASTRIC SLEEVE RESECTION  2015   TOTAL MASTECTOMY Bilateral    Patient Active Problem List   Diagnosis Date Noted   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   Borderline personality disorder ([place]) 09/21/2020   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/2018   Dislocation of patellofemoral joint 10/13/2017   Labor and delivery indication for care or intervention 02/15/2017   Pregnancy 01/01/2017   Encounter for procreative genetic counseling 41/66/0630   Umbilical hernia without obstruction and without gangrene    Norovirus 06/24/2016   ADHD (attention deficit hyperactivity disorder) 06/20/2016   Anxiety 06/20/2016   Asthma without status asthmaticus 06/20/2016   Depression 06/20/2016    Fibrocystic breast disease 06/20/2016   Fibromyalgia 06/20/2016   Migraine 06/20/2016   Ovarian cyst 06/20/2016   Morbid obesity ([place]) 02/24/2014   Headache 07/26/2013   KNEE PAIN, BILATERAL 01/02/2010   TENDINITIS, PATELLAR 01/02/2010    ONSET DATE: April 2023  REFERRING DIAG:  G35 (ICD-10-CM) - Multiple sclerosis   THERAPY DIAG:  No diagnosis found.  Rationale for Evaluation and Treatment: Rehabilitation  SUBJECTIVE:                                                                                                                                                                                             SUBJECTIVE STATEMENT:  Pt with lengthy subjective history of multiple complications associated  with this evaluation.  Pt notes that they are currently having complication from [place] from the 3rd and last time they were diagnosed back in December 2022.  Pt notes that their aphasia has recently been difficult due to being diagnosed with RSV last week and ever since then, has had the aphasia and is much worse today.  Pt utilizing cell phone to communicate with therapist at initial portion of the session, however after anxiety levels lower, pt is able to communicate more effectively.  Pt notes that their speech is more difficult because their thoughts.word finding is "fuzzy" at this time.  Pt reports that they are tri-lingual and can speak [place], [place], and Sign.  Pt notes no complications with signing and a method of communicating more effectively.    Pt also confirms PCL rupture and is wearing custom DonJoy knee brace on the R LE to assist with stability as they have experienced a patellar dislocation as well.    Pt notes that during a spinal tap in January, the concluded that the pt would need to have an MRI as well and that is when they were diagnosed with MS.  Pt is coming to therapy for all complications listed above.  Pt accompanied by: self  PERTINENT HISTORY: see previous    PAIN: Are you having pain? Yes: NPRS scale: 4/10 Pain location: Feet and Legs, R>L Pain description: Tingling and Cold (Always Cold) Aggravating factors: Walking, running, jumping Relieving factors: Heat  PRECAUTIONS: None  WEIGHT BEARING RESTRICTIONS: No  FALLS: Has patient fallen in last 6 months? No; last fall in April and broke L foot.  LIVING ENVIRONMENT: Lives with: lives with their family Lives in: House/apartment Stairs: Yes: Internal: 13 steps; on right going up Has following equipment at home:  DonJoy Custom R Knee Brace  PLOF: Independent  PATIENT GOALS: Strengthening due to losing a lot of atrophy.  Pt had weight loss surgery 2x.  Pt states the MD is wanting pt to increase the stability of the R knee.  Pt wants to be able to walk without pain.  OBJECTIVE:   DIAGNOSTIC FINDINGS:   EXAM: RIGHT KNEE - COMPLETE 4+ VIEW  IMPRESSION: 1. Moderate suprapatellar joint effusion. 2. Ossific density overlying the medial femoral condyle with overlying soft tissue swelling. In the setting of recent patellar dislocation findings may reflect an avulsion injury or osteochondral defect. Donor site is not apparent on the current imaging. Consider more definitive characterization with MRI or CT of the right knee.   COGNITION: Overall cognitive status: Within functional limits for tasks assessed and Pt does have hx of aphasia at times.   SENSATION: WFL  COORDINATION: WFL  POSTURE: No Significant postural limitations  LOWER EXTREMITY MMT:    MMT Right Eval Left Eval  Hip flexion 4+ 5  Hip extension 4+ 5  Hip abduction 4+ 5  Hip adduction 4+ 5  Hip internal rotation    Hip external rotation    Knee flexion 4 5  Knee extension 4+ 5  Ankle dorsiflexion 4+ 5  Ankle plantarflexion    Ankle inversion    Ankle eversion    (Blank rows = not tested)   GAIT: Gait pattern: WFL Distance walked: 40' Assistive device utilized: None; DonJoy brace donned on the R  LE for stability purposes Level of assistance: Complete Independence Comments: Stiffness noted in the R LE due to the brace, but otherwise WNL.  FUNCTIONAL TESTS:  5 times sit to stand: 12.83 sec Timed up and  go (TUG): Assess at next visit. 6 minute walk test: Assess at next visit. Dynamic Gait Index: Assess at next visit.  PATIENT SURVEYS:  FOTO 36/46  TODAY'S TREATMENT: DATE: 02/26/22    Eval Only   PATIENT EDUCATION: Education details: Pt educated on role of PT and services provided during current POC, along with prognosis and information about the clinic. Person educated: Patient Education method: Explanation Education comprehension: verbalized understanding  HOME EXERCISE PROGRAM:  Deferred to next session due to time constraints.   GOALS:  Goals reviewed with patient? Yes  SHORT TERM GOALS: Target date: 03/21/2022  Pt will be independent with HEP in order to demonstrate increased ability to perform tasks related to occupation/hobbies. Baseline: FOTO: Goal status: INITIAL   LONG TERM GOALS: Target date: 05/16/2022  1.  Patient will complete five times sit to stand test in < 10 seconds indicating an increased LE strength and improved balance. Baseline: 12.83 sec Goal status: INITIAL  2.  Patient will increase FOTO score to equal to or greater than 46 to demonstrate statistically significant improvement in mobility and quality of life.  Baseline: 36 Goal status: INITIAL   3.  Patient will increase FGA score by > 4 points to demonstrate decreased fall risk during functional activities. Baseline: Not performed at initial evaluation. Goal status: INITIAL   4.  Patient will reduce timed up and go to <11 seconds to reduce fall risk and demonstrate improved transfer/gait ability. Baseline: Not performed at initial evaluation. Goal status: INITIAL  5.  Patient will increase 10 meter walk test to >1.87ms as to improve gait speed for better community ambulation and  to reduce fall risk. Baseline: Not performed at initial evaluation. Goal status: INITIAL  6.  Patient will increase six minute walk test distance to >1000 for progression to community ambulator and improve gait ability Baseline: Not performed at initial evaluation. Goal status: INITIAL    ASSESSMENT:  CLINICAL IMPRESSION: Patient is 41y.o. seen today for physical therapy evaluation and treatment for multiple sclerosis.  Pt presents with physical impairments of decreased activity tolerance, increased pain in B LE with R>L, and decreased strength in R LE as noted.  Pt also demonstrating decreased balance at this time due to the MS and instability in the R knee due to the PCL rupture.  Pt noted to be short of breathe and having difficulty with aphasia due to complications with Long Covid/MS complications.  Pt will benefit from skilled therapy to address tolerance, balance, pain, and strength impairments necessary for improvement in quality of life.  Pt. demonstrates understanding of this plan of care and agrees with this plan      OBJECTIVE IMPAIRMENTS:  Abnormal gait, decreased activity tolerance, decreased balance, decreased endurance, decreased knowledge of condition, decreased mobility, difficulty walking, decreased strength, dizziness, impaired sensation, and pain.   ACTIVITY LIMITATIONS: lifting, bending, sitting, standing, squatting, sleeping, stairs, and locomotion level  PARTICIPATION LIMITATIONS: cleaning, laundry, interpersonal relationship, community activity, and occupation  PERSONAL FACTORS: Age, Education, Past/current experiences, Time since onset of injury/illness/exacerbation, and 3+ comorbidities: MS, Depression, Fibromyalgia, Rupture of PCL  are also affecting patient's functional outcome.   REHAB POTENTIAL: Good  CLINICAL DECISION MAKING: Stable/uncomplicated  EVALUATION COMPLEXITY: High  PLAN:  PT FREQUENCY: 2x/week  PT DURATION: 12 weeks  PLANNED  INTERVENTIONS: Therapeutic exercises, Therapeutic activity, Neuromuscular re-education, Balance training, Gait training, Patient/Family education, Self Care, Joint mobilization, Stair training, Vestibular training, Canalith repositioning, Dry Needling, Electrical stimulation, Spinal manipulation, Spinal mobilization, Cryotherapy, Moist heat, Traction,  and Manual therapy  PLAN FOR NEXT SESSION:   Perform balance related testing including but not limited to: CTSIB-Modified DGI 10 meter walk test 6MWT TUG Knee ROM ***    Particia Lather PT  02/26/22, 7:58 AM

## 2022-02-28 ENCOUNTER — Ambulatory Visit: Payer: BC Managed Care – PPO

## 2022-02-28 DIAGNOSIS — R29898 Other symptoms and signs involving the musculoskeletal system: Secondary | ICD-10-CM | POA: Diagnosis not present

## 2022-02-28 DIAGNOSIS — M6281 Muscle weakness (generalized): Secondary | ICD-10-CM

## 2022-02-28 NOTE — Therapy (Signed)
OUTPATIENT PHYSICAL THERAPY NEURO TREATMENT   Patient Name: [name] MRN: [id number] DOB:02/26/1981, 41 y.o., adult Today's Date: 02/28/2022   PCP: Paulita Cradle, MD REFERRING PROVIDER: Paulita Cradle, MD  END OF SESSION:  PT End of Session - 02/28/22 1110     Visit Number 2    Number of Visits 24    Date for PT Re-Evaluation 05/16/22    PT Start Time 1110    PT Stop Time 1148    PT Time Calculation (min) 38 min    Activity Tolerance Patient tolerated treatment well    Behavior During Therapy WFL for tasks assessed/performed             Past Medical History:  Diagnosis Date   Asthma    Breast cyst    Breast mass    Chronic fatigue syndrome    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[place])    Depression    Encephalomyelitis    Fibrocystic breast    Fibromyalgia    Long COVID    Migraines    MS (multiple sclerosis) ([place])    Past Surgical History:  Procedure Laterality Date   BUNIONECTOMY Bilateral    COLONOSCOPY WITH PROPOFOL N/A 08/19/2019   Procedure: COLONOSCOPY WITH PROPOFOL;  Surgeon: Lin Landsman, MD;  Location: [facility] ENDOSCOPY;  Service: Gastroenterology;  Laterality: N/A;   ESOPHAGOGASTRODUODENOSCOPY (EGD) WITH PROPOFOL N/A 08/19/2019   Procedure: ESOPHAGOGASTRODUODENOSCOPY (EGD) WITH PROPOFOL;  Surgeon: Lin Landsman, MD;  Location: [facility] ENDOSCOPY;  Service: Gastroenterology;  Laterality: N/A;   EYE SURGERY     blood clot in eye   HERNIA REPAIR  1991, 2001   inguinal and umbilical   LAPAROSCOPIC GASTRIC SLEEVE RESECTION  2015   TOTAL MASTECTOMY Bilateral    Patient Active Problem List   Diagnosis Date Noted   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   Borderline personality disorder ([place]) 09/21/2020   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/2018   Dislocation of patellofemoral joint 10/13/2017   Labor and delivery indication for care or intervention 02/15/2017    Pregnancy 01/01/2017   Encounter for procreative genetic counseling 42/59/5638   Umbilical hernia without obstruction and without gangrene    Norovirus 06/24/2016   ADHD (attention deficit hyperactivity disorder) 06/20/2016   Anxiety 06/20/2016   Asthma without status asthmaticus 06/20/2016   Depression 06/20/2016   Fibrocystic breast disease 06/20/2016   Fibromyalgia 06/20/2016   Migraine 06/20/2016   Ovarian cyst 06/20/2016   Morbid obesity ([place]) 02/24/2014   Headache 07/26/2013   KNEE PAIN, BILATERAL 01/02/2010   TENDINITIS, PATELLAR 01/02/2010    ONSET DATE: April 2023  REFERRING DIAG:  G35 (ICD-10-CM) - Multiple sclerosis   THERAPY DIAG:  Weakness of right lower extremity  Muscle weakness (generalized)  Rationale for Evaluation and Treatment: Rehabilitation  SUBJECTIVE:  SUBJECTIVE STATEMENT: Patient arrived late from PT session; was in a sleep study last night.    Pt accompanied by: self  PERTINENT HISTORY: Pt with lengthy subjective history of multiple complications associated with this evaluation.  Pt notes that they are currently having complication from [place] from the 3rd and last time they were diagnosed back in December 2022.  Pt notes that their aphasia has recently been difficult due to being diagnosed with RSV last week and ever since then, has had the aphasia and is much worse today.  Pt utilizing cell phone to communicate with therapist at initial portion of the session, however after anxiety levels lower, pt is able to communicate more effectively.  Pt notes that their speech is more difficult because their thoughts.word finding is "fuzzy" at this time.  Pt reports that they are tri-lingual and can speak [place], [place], and Sign.  Pt notes no complications with  signing and a method of communicating more effectively.    Pt also confirms PCL rupture and is wearing custom DonJoy knee brace on the R LE to assist with stability as they have experienced a patellar dislocation as well.    Pt notes that during a spinal tap in January, the concluded that the pt would need to have an MRI as well and that is when they were diagnosed with MS.  Pt is coming to therapy for all complications listed above.  PAIN: Are you having pain? Yes: NPRS scale: 4/10 Pain location: Feet and Legs, R>L Pain description: Tingling and Cold (Always Cold) Aggravating factors: Walking, running, jumping Relieving factors: Heat  PRECAUTIONS: None  WEIGHT BEARING RESTRICTIONS: No  FALLS: Has patient fallen in last 6 months? No; last fall in April and broke L foot.  LIVING ENVIRONMENT: Lives with: lives with their family Lives in: House/apartment Stairs: Yes: Internal: 13 steps; on right going up Has following equipment at home:  DonJoy Custom R Knee Brace  PLOF: Independent  PATIENT GOALS: Strengthening due to losing a lot of atrophy.  Pt had weight loss surgery 2x.  Pt states the MD is wanting pt to increase the stability of the R knee.  Pt wants to be able to walk without pain.  OBJECTIVE:   DIAGNOSTIC FINDINGS:   EXAM: RIGHT KNEE - COMPLETE 4+ VIEW  IMPRESSION: 1. Moderate suprapatellar joint effusion. 2. Ossific density overlying the medial femoral condyle with overlying soft tissue swelling. In the setting of recent patellar dislocation findings may reflect an avulsion injury or osteochondral defect. Donor site is not apparent on the current imaging. Consider more definitive characterization with MRI or CT of the right knee.   COGNITION: Overall cognitive status: Within functional limits for tasks assessed and Pt does have hx of aphasia at times.   SENSATION: WFL  COORDINATION: WFL  POSTURE: No Significant postural limitations  LOWER EXTREMITY MMT:     MMT Right Eval Left Eval  Hip flexion 4+ 5  Hip extension 4+ 5  Hip abduction 4+ 5  Hip adduction 4+ 5  Hip internal rotation    Hip external rotation    Knee flexion 4 5  Knee extension 4+ 5  Ankle dorsiflexion 4+ 5  Ankle plantarflexion    Ankle inversion    Ankle eversion    (Blank rows = not tested)   GAIT: Gait pattern: WFL Distance walked: 40' Assistive device utilized: None; DonJoy brace donned on the R LE for stability purposes Level of assistance: Complete Independence Comments: Stiffness noted in the  R LE due to the brace, but otherwise WNL.  FUNCTIONAL TESTS:  5 times sit to stand: 12.83 sec Timed up and go (TUG): Assess at next visit. 6 minute walk test: Assess at next visit. Dynamic Gait Index: Assess at next visit.  PATIENT SURVEYS:  FOTO 36/46  TODAY'S TREATMENT: DATE: 02/28/22    Goals:   6MWT: 1405 ft with severe fatigue  TUG: 6 seconds  FGA: 20/30  Treatment: Alphabet each ankle: added to HEP   PATIENT EDUCATION: Education details: Pt educated on role of PT and services provided during current POC, along with prognosis and information about the clinic. Person educated: Patient Education method: Explanation Education comprehension: verbalized understanding  HOME EXERCISE PROGRAM:  Deferred to next session due to time constraints.   GOALS:  Goals reviewed with patient? Yes  SHORT TERM GOALS: Target date: 03/21/2022  Pt will be independent with HEP in order to demonstrate increased ability to perform tasks related to occupation/hobbies. Baseline: FOTO: Goal status: INITIAL   LONG TERM GOALS: Target date: 05/16/2022  1.  Patient will complete five times sit to stand test in < 10 seconds indicating an increased LE strength and improved balance. Baseline: 12.83 sec Goal status: INITIAL  2.  Patient will increase FOTO score to equal to or greater than 46 to demonstrate statistically significant improvement in mobility and quality  of life.  Baseline: 36 Goal status: INITIAL   3.  Patient will increase FGA score by > 4 points to demonstrate decreased fall risk during functional activities. Baseline: Not performed at initial evaluation. 12/21: 20/30 Goal status: INITIAL   4.  Patient will reduce timed up and go to <11 seconds to reduce fall risk and demonstrate improved transfer/gait ability. Baseline: Not performed at initial evaluation. 12/21: 6 seconds Goal status: MET  5.  Patient will increase 10 meter walk test to >1.64ms as to improve gait speed for better community ambulation and to reduce fall risk. Baseline: Not performed at initial evaluation. Goal status: INITIAL  6.  Patient will increase six minute walk test distance to >1800 for progression to community ambulator and improve gait ability Baseline: Not performed at initial evaluation. 12/1: 1405 ft Goal status: INITIAL    ASSESSMENT:  CLINICAL IMPRESSION: Patient performed outcome measures with intermittent fatigue. Bilateral inversion of feet noted with R>L with ambulation. Stabilization with eyes closed, head turns, and narrow base of support. Patient educated on ankle alphabet for adding to POC. Pt will benefit from skilled therapy to address tolerance, balance, pain, and strength impairments necessary for improvement in quality of life.  Pt. demonstrates understanding of this plan of care and agrees with this plan      OBJECTIVE IMPAIRMENTS:  Abnormal gait, decreased activity tolerance, decreased balance, decreased endurance, decreased knowledge of condition, decreased mobility, difficulty walking, decreased strength, dizziness, impaired sensation, and pain.   ACTIVITY LIMITATIONS: lifting, bending, sitting, standing, squatting, sleeping, stairs, and locomotion level  PARTICIPATION LIMITATIONS: cleaning, laundry, interpersonal relationship, community activity, and occupation  PERSONAL FACTORS: Age, Education, Past/current experiences, Time  since onset of injury/illness/exacerbation, and 3+ comorbidities: MS, Depression, Fibromyalgia, Rupture of PCL  are also affecting patient's functional outcome.   REHAB POTENTIAL: Good  CLINICAL DECISION MAKING: Stable/uncomplicated  EVALUATION COMPLEXITY: High  PLAN:  PT FREQUENCY: 2x/week  PT DURATION: 12 weeks  PLANNED INTERVENTIONS: Therapeutic exercises, Therapeutic activity, Neuromuscular re-education, Balance training, Gait training, Patient/Family education, Self Care, Joint mobilization, Stair training, Vestibular training, Canalith repositioning, Dry Needling, Electrical stimulation, Spinal manipulation, Spinal mobilization,  Cryotherapy, Moist heat, Traction, and Manual therapy  PLAN FOR NEXT SESSION:   give [place] PT  Physical Therapist- [facility]  02/28/22, 12:33 PM

## 2022-03-02 NOTE — Therapy (Signed)
OUTPATIENT PHYSICAL THERAPY NEURO TREATMENT   Patient Name: [name] MRN: [id number] DOB:11/03/1980, 41 y.o., adult Today's Date: 03/05/2022   PCP: Paulita Cradle, MD REFERRING PROVIDER: Paulita Cradle, MD  END OF SESSION:  PT End of Session - 03/05/22 1320     Visit Number 3    Number of Visits 24    Date for PT Re-Evaluation 05/16/22    PT Start Time 1318    PT Stop Time 1346    PT Time Calculation (min) 28 min    Activity Tolerance Patient tolerated treatment well    Behavior During Therapy WFL for tasks assessed/performed              Past Medical History:  Diagnosis Date   Asthma    Breast cyst    Breast mass    Chronic fatigue syndrome    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[place])    Depression    Encephalomyelitis    Fibrocystic breast    Fibromyalgia    Long COVID    Migraines    MS (multiple sclerosis) ([place])    Past Surgical History:  Procedure Laterality Date   BUNIONECTOMY Bilateral    COLONOSCOPY WITH PROPOFOL N/A 08/19/2019   Procedure: COLONOSCOPY WITH PROPOFOL;  Surgeon: Lin Landsman, MD;  Location: [facility] ENDOSCOPY;  Service: Gastroenterology;  Laterality: N/A;   ESOPHAGOGASTRODUODENOSCOPY (EGD) WITH PROPOFOL N/A 08/19/2019   Procedure: ESOPHAGOGASTRODUODENOSCOPY (EGD) WITH PROPOFOL;  Surgeon: Lin Landsman, MD;  Location: [facility] ENDOSCOPY;  Service: Gastroenterology;  Laterality: N/A;   EYE SURGERY     blood clot in eye   HERNIA REPAIR  1991, 2001   inguinal and umbilical   LAPAROSCOPIC GASTRIC SLEEVE RESECTION  2015   TOTAL MASTECTOMY Bilateral    Patient Active Problem List   Diagnosis Date Noted   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   Borderline personality disorder ([place]) 09/21/2020   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/2018   Dislocation of patellofemoral joint 10/13/2017   Labor and delivery indication for care or intervention  02/15/2017   Pregnancy 01/01/2017   Encounter for procreative genetic counseling 76/54/6503   Umbilical hernia without obstruction and without gangrene    Norovirus 06/24/2016   ADHD (attention deficit hyperactivity disorder) 06/20/2016   Anxiety 06/20/2016   Asthma without status asthmaticus 06/20/2016   Depression 06/20/2016   Fibrocystic breast disease 06/20/2016   Fibromyalgia 06/20/2016   Migraine 06/20/2016   Ovarian cyst 06/20/2016   Morbid obesity ([place]) 02/24/2014   Headache 07/26/2013   KNEE PAIN, BILATERAL 01/02/2010   TENDINITIS, PATELLAR 01/02/2010    ONSET DATE: April 2023  REFERRING DIAG:  G35 (ICD-10-CM) - Multiple sclerosis   THERAPY DIAG:  Weakness of right lower extremity  Muscle weakness (generalized)  Other lack of coordination  Rationale for Evaluation and Treatment: Rehabilitation  SUBJECTIVE:  SUBJECTIVE STATEMENT: Patient arrived late due to car troubles. Has been compliant with HEP.    Pt accompanied by: self  PERTINENT HISTORY: Pt with lengthy subjective history of multiple complications associated with this evaluation.  Pt notes that they are currently having complication from [place] from the 3rd and last time they were diagnosed back in December 2022.  Pt notes that their aphasia has recently been difficult due to being diagnosed with RSV last week and ever since then, has had the aphasia and is much worse today.  Pt utilizing cell phone to communicate with therapist at initial portion of the session, however after anxiety levels lower, pt is able to communicate more effectively.  Pt notes that their speech is more difficult because their thoughts.word finding is "fuzzy" at this time.  Pt reports that they are tri-lingual and can speak [place], [place], and  Sign.  Pt notes no complications with signing and a method of communicating more effectively.    Pt also confirms PCL rupture and is wearing custom DonJoy knee brace on the R LE to assist with stability as they have experienced a patellar dislocation as well.    Pt notes that during a spinal tap in January, the concluded that the pt would need to have an MRI as well and that is when they were diagnosed with MS.  Pt is coming to therapy for all complications listed above.  PAIN: Are you having pain? Yes: NPRS scale: 4/10 Pain location: Feet and Legs, R>L Pain description: Tingling and Cold (Always Cold) Aggravating factors: Walking, running, jumping Relieving factors: Heat  PRECAUTIONS: None  WEIGHT BEARING RESTRICTIONS: No  FALLS: Has patient fallen in last 6 months? No; last fall in April and broke L foot.  LIVING ENVIRONMENT: Lives with: lives with their family Lives in: House/apartment Stairs: Yes: Internal: 13 steps; on right going up Has following equipment at home:  DonJoy Custom R Knee Brace  PLOF: Independent  PATIENT GOALS: Strengthening due to losing a lot of atrophy.  Pt had weight loss surgery 2x.  Pt states the MD is wanting pt to increase the stability of the R knee.  Pt wants to be able to walk without pain.  OBJECTIVE:   DIAGNOSTIC FINDINGS:   EXAM: RIGHT KNEE - COMPLETE 4+ VIEW  IMPRESSION: 1. Moderate suprapatellar joint effusion. 2. Ossific density overlying the medial femoral condyle with overlying soft tissue swelling. In the setting of recent patellar dislocation findings may reflect an avulsion injury or osteochondral defect. Donor site is not apparent on the current imaging. Consider more definitive characterization with MRI or CT of the right knee.   COGNITION: Overall cognitive status: Within functional limits for tasks assessed and Pt does have hx of aphasia at times.   SENSATION: WFL  COORDINATION: WFL  POSTURE: No Significant  postural limitations  LOWER EXTREMITY MMT:    MMT Right Eval Left Eval  Hip flexion 4+ 5  Hip extension 4+ 5  Hip abduction 4+ 5  Hip adduction 4+ 5  Hip internal rotation    Hip external rotation    Knee flexion 4 5  Knee extension 4+ 5  Ankle dorsiflexion 4+ 5  Ankle plantarflexion    Ankle inversion    Ankle eversion    (Blank rows = not tested)   GAIT: Gait pattern: WFL Distance walked: 40' Assistive device utilized: None; DonJoy brace donned on the R LE for stability purposes Level of assistance: Complete Independence Comments: Stiffness noted in the R  LE due to the brace, but otherwise WNL.  FUNCTIONAL TESTS:  5 times sit to stand: 12.83 sec Timed up and go (TUG): Assess at next visit. 6 minute walk test: Assess at next visit. Dynamic Gait Index: Assess at next visit.  PATIENT SURVEYS:  FOTO 36/46  TODAY'S TREATMENT: DATE: 03/05/22     Treatment: TherEx:  YTB march with df 8x each LE; very challenging YTB eversion 15x each LE Heel toe raise in standing 20x  Neuro re-ed Standing with CGA next to support surface:  Airex pad: static stand 30 seconds x 2 trials, noticeable trembling of ankles/LE's with fatigue and challenge to maintain stability Airex pad: horizontal head turns 30 seconds scanning room 10x ; cueing for arc of motion ; more challenging to the R  Airex pad: vertical head turns 30 seconds, cueing for arc of motion, noticeable sway with upward gaze increasing demand on ankle righting reaction musculature Airex pad: eyes closed 10 seconds  Airex pad: one foot on 6" step one foot on airex pad, hold position for 30 seconds, switch legs, 2x each LE;  PATIENT EDUCATION: Education details: Pt educated on role of PT and services provided during current POC, along with prognosis and information about the clinic. Person educated: Patient Education method: Explanation Education comprehension: verbalized understanding  HOME EXERCISE  PROGRAM:  Deferred to next session due to time constraints.   GOALS:  Goals reviewed with patient? Yes  SHORT TERM GOALS: Target date: 03/21/2022  Pt will be independent with HEP in order to demonstrate increased ability to perform tasks related to occupation/hobbies. Baseline: FOTO: Goal status: INITIAL   LONG TERM GOALS: Target date: 05/16/2022  1.  Patient will complete five times sit to stand test in < 10 seconds indicating an increased LE strength and improved balance. Baseline: 12.83 sec Goal status: INITIAL  2.  Patient will increase FOTO score to equal to or greater than 46 to demonstrate statistically significant improvement in mobility and quality of life.  Baseline: 36 Goal status: INITIAL   3.  Patient will increase FGA score by > 4 points to demonstrate decreased fall risk during functional activities. Baseline: Not performed at initial evaluation. 12/21: 20/30 Goal status: INITIAL   4.  Patient will reduce timed up and go to <11 seconds to reduce fall risk and demonstrate improved transfer/gait ability. Baseline: Not performed at initial evaluation. 12/21: 6 seconds Goal status: MET  5.  Patient will increase 10 meter walk test to >1.29ms as to improve gait speed for better community ambulation and to reduce fall risk. Baseline: Not performed at initial evaluation. Goal status: INITIAL  6.  Patient will increase six minute walk test distance to >1800 for progression to community ambulator and improve gait ability Baseline: Not performed at initial evaluation. 12/1: 1405 ft Goal status: INITIAL    ASSESSMENT:  CLINICAL IMPRESSION: Patient presents late to PT session, educated on late policy, will require patient to arrive on time if they want to continue therapy. Patient verbalized understanding. Patient educated and performed ankle strategy interventions with fatigue noted on unstable surfaces and with resistance.  Next session will benefit from big toe  flexion and foot control. Pt. demonstrates understanding of this plan of care and agrees with this plan      OBJECTIVE IMPAIRMENTS:  Abnormal gait, decreased activity tolerance, decreased balance, decreased endurance, decreased knowledge of condition, decreased mobility, difficulty walking, decreased strength, dizziness, impaired sensation, and pain.   ACTIVITY LIMITATIONS: lifting, bending, sitting, standing, squatting, sleeping,  stairs, and locomotion level  PARTICIPATION LIMITATIONS: cleaning, laundry, interpersonal relationship, community activity, and occupation  PERSONAL FACTORS: Age, Education, Past/current experiences, Time since onset of injury/illness/exacerbation, and 3+ comorbidities: MS, Depression, Fibromyalgia, Rupture of PCL  are also affecting patient's functional outcome.   REHAB POTENTIAL: Good  CLINICAL DECISION MAKING: Stable/uncomplicated  EVALUATION COMPLEXITY: High  PLAN:  PT FREQUENCY: 2x/week  PT DURATION: 12 weeks  PLANNED INTERVENTIONS: Therapeutic exercises, Therapeutic activity, Neuromuscular re-education, Balance training, Gait training, Patient/Family education, Self Care, Joint mobilization, Stair training, Vestibular training, Canalith repositioning, Dry Needling, Electrical stimulation, Spinal manipulation, Spinal mobilization, Cryotherapy, Moist heat, Traction, and Manual therapy  PLAN FOR NEXT SESSION:   *work on big toe flexion, foot flexion; take shoe off     [facility]  03/05/22, 1:51 PM

## 2022-03-05 ENCOUNTER — Ambulatory Visit: Payer: BC Managed Care – PPO

## 2022-03-05 DIAGNOSIS — R29898 Other symptoms and signs involving the musculoskeletal system: Secondary | ICD-10-CM | POA: Diagnosis not present

## 2022-03-05 DIAGNOSIS — R278 Other lack of coordination: Secondary | ICD-10-CM

## 2022-03-05 DIAGNOSIS — M6281 Muscle weakness (generalized): Secondary | ICD-10-CM

## 2022-03-06 NOTE — Therapy (Incomplete)
OUTPATIENT PHYSICAL THERAPY NEURO TREATMENT   Patient Name: [name] MRN: [id number] DOB:11-25-1980, 41 y.o., adult Today's Date: 03/07/2022   PCP: Paulita Cradle, MD REFERRING PROVIDER: Paulita Cradle, MD  END OF SESSION:  PT End of Session - 03/07/22 1100     Visit Number 4    Number of Visits 24    Date for PT Re-Evaluation 05/16/22    PT Start Time 1100    PT Stop Time 1144    PT Time Calculation (min) 44 min    Activity Tolerance Patient tolerated treatment well    Behavior During Therapy WFL for tasks assessed/performed               Past Medical History:  Diagnosis Date   Asthma    Breast cyst    Breast mass    Chronic fatigue syndrome    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[place])    Depression    Encephalomyelitis    Fibrocystic breast    Fibromyalgia    Long COVID    Migraines    MS (multiple sclerosis) ([place])    Past Surgical History:  Procedure Laterality Date   BUNIONECTOMY Bilateral    COLONOSCOPY WITH PROPOFOL N/A 08/19/2019   Procedure: COLONOSCOPY WITH PROPOFOL;  Surgeon: Lin Landsman, MD;  Location: [facility] ENDOSCOPY;  Service: Gastroenterology;  Laterality: N/A;   ESOPHAGOGASTRODUODENOSCOPY (EGD) WITH PROPOFOL N/A 08/19/2019   Procedure: ESOPHAGOGASTRODUODENOSCOPY (EGD) WITH PROPOFOL;  Surgeon: Lin Landsman, MD;  Location: [facility] ENDOSCOPY;  Service: Gastroenterology;  Laterality: N/A;   EYE SURGERY     blood clot in eye   HERNIA REPAIR  1991, 2001   inguinal and umbilical   LAPAROSCOPIC GASTRIC SLEEVE RESECTION  2015   TOTAL MASTECTOMY Bilateral    Patient Active Problem List   Diagnosis Date Noted   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   Borderline personality disorder ([place]) 09/21/2020   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/2018   Dislocation of patellofemoral joint 10/13/2017   Labor and delivery indication for care or intervention  02/15/2017   Pregnancy 01/01/2017   Encounter for procreative genetic counseling 83/25/4982   Umbilical hernia without obstruction and without gangrene    Norovirus 06/24/2016   ADHD (attention deficit hyperactivity disorder) 06/20/2016   Anxiety 06/20/2016   Asthma without status asthmaticus 06/20/2016   Depression 06/20/2016   Fibrocystic breast disease 06/20/2016   Fibromyalgia 06/20/2016   Migraine 06/20/2016   Ovarian cyst 06/20/2016   Morbid obesity ([place]) 02/24/2014   Headache 07/26/2013   KNEE PAIN, BILATERAL 01/02/2010   TENDINITIS, PATELLAR 01/02/2010    ONSET DATE: April 2023  REFERRING DIAG:  G35 (ICD-10-CM) - Multiple sclerosis   THERAPY DIAG:  Weakness of right lower extremity  Muscle weakness (generalized)  Other lack of coordination  Rationale for Evaluation and Treatment: Rehabilitation  SUBJECTIVE:  SUBJECTIVE STATEMENT: Patient arrived  on time, has been compliant with HEP. Very fatigued with full body aches today.    Pt accompanied by: self  PERTINENT HISTORY: Pt with lengthy subjective history of multiple complications associated with this evaluation.  Pt notes that they are currently having complication from [place] from the 3rd and last time they were diagnosed back in December 2022.  Pt notes that their aphasia has recently been difficult due to being diagnosed with RSV last week and ever since then, has had the aphasia and is much worse today.  Pt utilizing cell phone to communicate with therapist at initial portion of the session, however after anxiety levels lower, pt is able to communicate more effectively.  Pt notes that their speech is more difficult because their thoughts.word finding is "fuzzy" at this time.  Pt reports that they are tri-lingual and can speak  [place], [place], and Sign.  Pt notes no complications with signing and a method of communicating more effectively.    Pt also confirms PCL rupture and is wearing custom DonJoy knee brace on the R LE to assist with stability as they have experienced a patellar dislocation as well.    Pt notes that during a spinal tap in January, the concluded that the pt would need to have an MRI as well and that is when they were diagnosed with MS.  Pt is coming to therapy for all complications listed above.  PAIN: Are you having pain? Yes: NPRS scale: 4/10 Pain location: Feet and Legs, R>L Pain description: Tingling and Cold (Always Cold) Aggravating factors: Walking, running, jumping Relieving factors: Heat  PRECAUTIONS: None  WEIGHT BEARING RESTRICTIONS: No  FALLS: Has patient fallen in last 6 months? No; last fall in April and broke L foot.  LIVING ENVIRONMENT: Lives with: lives with their family Lives in: House/apartment Stairs: Yes: Internal: 13 steps; on right going up Has following equipment at home:  DonJoy Custom R Knee Brace  PLOF: Independent  PATIENT GOALS: Strengthening due to losing a lot of atrophy.  Pt had weight loss surgery 2x.  Pt states the MD is wanting pt to increase the stability of the R knee.  Pt wants to be able to walk without pain.  OBJECTIVE:   DIAGNOSTIC FINDINGS:   EXAM: RIGHT KNEE - COMPLETE 4+ VIEW  IMPRESSION: 1. Moderate suprapatellar joint effusion. 2. Ossific density overlying the medial femoral condyle with overlying soft tissue swelling. In the setting of recent patellar dislocation findings may reflect an avulsion injury or osteochondral defect. Donor site is not apparent on the current imaging. Consider more definitive characterization with MRI or CT of the right knee.   COGNITION: Overall cognitive status: Within functional limits for tasks assessed and Pt does have hx of aphasia at times.   SENSATION: WFL  COORDINATION: WFL  POSTURE:  No Significant postural limitations  LOWER EXTREMITY MMT:    MMT Right Eval Left Eval  Hip flexion 4+ 5  Hip extension 4+ 5  Hip abduction 4+ 5  Hip adduction 4+ 5  Hip internal rotation    Hip external rotation    Knee flexion 4 5  Knee extension 4+ 5  Ankle dorsiflexion 4+ 5  Ankle plantarflexion    Ankle inversion    Ankle eversion    (Blank rows = not tested)   GAIT: Gait pattern: WFL Distance walked: 40' Assistive device utilized: None; DonJoy brace donned on the R LE for stability purposes Level of assistance: Complete Independence Comments:  Stiffness noted in the R LE due to the brace, but otherwise WNL.  FUNCTIONAL TESTS:  5 times sit to stand: 12.83 sec Timed up and go (TUG): Assess at next visit. 6 minute walk test: Assess at next visit. Dynamic Gait Index: Assess at next visit.  PATIENT SURVEYS:  FOTO 36/46  TODAY'S TREATMENT: DATE: 03/07/22     Treatment: TherEx:  Foot flexion/towel scrunches 10x; x 2 sets Big toe extension/flexion AAROM 10x each LE Toe abduction 15x [place] pick up 10x each LE; x 2 trials ; initially very challenging LLE Dynadisc df/pf 10x each side Dynadisc eversion/inversion 10x each side  Manual: First met head mobilizations R big toe x 3 minutes First met head abduction PROM 15x  PATIENT EDUCATION: Education details: Pt educated on role of PT and services provided during current POC, along with prognosis and information about the clinic. Person educated: Patient Education method: Explanation Education comprehension: verbalized understanding  HOME EXERCISE PROGRAM:  Bilateral foot eversion Alphabet each LE March with df with YTB    GOALS:  Goals reviewed with patient? Yes  SHORT TERM GOALS: Target date: 03/21/2022  Pt will be independent with HEP in order to demonstrate increased ability to perform tasks related to occupation/hobbies. Baseline: FOTO: Goal status: INITIAL   LONG TERM GOALS: Target date:  05/16/2022  1.  Patient will complete five times sit to stand test in < 10 seconds indicating an increased LE strength and improved balance. Baseline: 12.83 sec Goal status: INITIAL  2.  Patient will increase FOTO score to equal to or greater than 46 to demonstrate statistically significant improvement in mobility and quality of life.  Baseline: 36 Goal status: INITIAL   3.  Patient will increase FGA score by > 4 points to demonstrate decreased fall risk during functional activities. Baseline: Not performed at initial evaluation. 12/21: 20/30 Goal status: INITIAL   4.  Patient will reduce timed up and go to <11 seconds to reduce fall risk and demonstrate improved transfer/gait ability. Baseline: Not performed at initial evaluation. 12/21: 6 seconds Goal status: MET  5.  Patient will increase 10 meter walk test to >1.75ms as to improve gait speed for better community ambulation and to reduce fall risk. Baseline: Not performed at initial evaluation. Goal status: INITIAL  6.  Patient will increase six minute walk test distance to >1800 for progression to community ambulator and improve gait ability Baseline: Not performed at initial evaluation. 12/1: 1405 ft Goal status: INITIAL    ASSESSMENT:  CLINICAL IMPRESSION: Patient has limited first met movement of R foot that improves with repetition. Continued focus on foot strengthening interventions will benefit patient's stability and mobility, patient agreeable with POC. Pt. demonstrates understanding of this plan of care and agrees with this plan      OBJECTIVE IMPAIRMENTS:  Abnormal gait, decreased activity tolerance, decreased balance, decreased endurance, decreased knowledge of condition, decreased mobility, difficulty walking, decreased strength, dizziness, impaired sensation, and pain.   ACTIVITY LIMITATIONS: lifting, bending, sitting, standing, squatting, sleeping, stairs, and locomotion level  PARTICIPATION LIMITATIONS:  cleaning, laundry, interpersonal relationship, community activity, and occupation  PERSONAL FACTORS: Age, Education, Past/current experiences, Time since onset of injury/illness/exacerbation, and 3+ comorbidities: MS, Depression, Fibromyalgia, Rupture of PCL  are also affecting patient's functional outcome.   REHAB POTENTIAL: Good  CLINICAL DECISION MAKING: Stable/uncomplicated  EVALUATION COMPLEXITY: High  PLAN:  PT FREQUENCY: 2x/week  PT DURATION: 12 weeks  PLANNED INTERVENTIONS: Therapeutic exercises, Therapeutic activity, Neuromuscular re-education, Balance training, Gait training, Patient/Family education, Self  Care, Joint mobilization, Stair training, Vestibular training, Canalith repositioning, Dry Needling, Electrical stimulation, Spinal manipulation, Spinal mobilization, Cryotherapy, Moist heat, Traction, and Manual therapy  PLAN FOR NEXT SESSION:  Foot intrinsic musculature strengthening, ankle righting reactions     Janna Arch PT  Physical Therapist- [facility]  03/07/22, 1:07 PM

## 2022-03-07 ENCOUNTER — Ambulatory Visit: Payer: BC Managed Care – PPO

## 2022-03-07 DIAGNOSIS — M6281 Muscle weakness (generalized): Secondary | ICD-10-CM

## 2022-03-07 DIAGNOSIS — R29898 Other symptoms and signs involving the musculoskeletal system: Secondary | ICD-10-CM

## 2022-03-07 DIAGNOSIS — R278 Other lack of coordination: Secondary | ICD-10-CM

## 2022-03-12 ENCOUNTER — Ambulatory Visit: Payer: BC Managed Care – PPO | Attending: Physician Assistant

## 2022-03-12 DIAGNOSIS — R278 Other lack of coordination: Secondary | ICD-10-CM | POA: Diagnosis present

## 2022-03-12 DIAGNOSIS — F419 Anxiety disorder, unspecified: Secondary | ICD-10-CM | POA: Diagnosis present

## 2022-03-12 DIAGNOSIS — R41841 Cognitive communication deficit: Secondary | ICD-10-CM | POA: Insufficient documentation

## 2022-03-12 DIAGNOSIS — G35 Multiple sclerosis: Secondary | ICD-10-CM

## 2022-03-12 DIAGNOSIS — M6281 Muscle weakness (generalized): Secondary | ICD-10-CM

## 2022-03-12 DIAGNOSIS — U099 Post covid-19 condition, unspecified: Secondary | ICD-10-CM | POA: Diagnosis present

## 2022-03-12 DIAGNOSIS — R29898 Other symptoms and signs involving the musculoskeletal system: Secondary | ICD-10-CM | POA: Insufficient documentation

## 2022-03-12 NOTE — Therapy (Signed)
OUTPATIENT PHYSICAL THERAPY NEURO TREATMENT   Patient Name: [name] MRN: [id number] DOB:06-Sep-1980, 42 y.o., adult Today's Date: 03/13/2022   PCP: Paulita Cradle, MD REFERRING PROVIDER: Paulita Cradle, MD  END OF SESSION:  PT End of Session - 03/12/22 1351     Visit Number 5    Number of Visits 24    Date for PT Re-Evaluation 05/16/22    PT Start Time 1350    PT Stop Time 1428    PT Time Calculation (min) 38 min    Activity Tolerance Patient tolerated treatment well    Behavior During Therapy WFL for tasks assessed/performed                Past Medical History:  Diagnosis Date   Asthma    Breast cyst    Breast mass    Chronic fatigue syndrome    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[place])    Depression    Encephalomyelitis    Fibrocystic breast    Fibromyalgia    Long COVID    Migraines    MS (multiple sclerosis) ([place])    Past Surgical History:  Procedure Laterality Date   BUNIONECTOMY Bilateral    COLONOSCOPY WITH PROPOFOL N/A 08/19/2019   Procedure: COLONOSCOPY WITH PROPOFOL;  Surgeon: Lin Landsman, MD;  Location: [facility] ENDOSCOPY;  Service: Gastroenterology;  Laterality: N/A;   ESOPHAGOGASTRODUODENOSCOPY (EGD) WITH PROPOFOL N/A 08/19/2019   Procedure: ESOPHAGOGASTRODUODENOSCOPY (EGD) WITH PROPOFOL;  Surgeon: Lin Landsman, MD;  Location: [facility] ENDOSCOPY;  Service: Gastroenterology;  Laterality: N/A;   EYE SURGERY     blood clot in eye   HERNIA REPAIR  1991, 2001   inguinal and umbilical   LAPAROSCOPIC GASTRIC SLEEVE RESECTION  2015   TOTAL MASTECTOMY Bilateral    Patient Active Problem List   Diagnosis Date Noted   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   Borderline personality disorder ([place]) 09/21/2020   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/2018   Dislocation of patellofemoral joint 10/13/2017   Labor and delivery indication for care or intervention  02/15/2017   Pregnancy 01/01/2017   Encounter for procreative genetic counseling 12/15/1217   Umbilical hernia without obstruction and without gangrene    Norovirus 06/24/2016   ADHD (attention deficit hyperactivity disorder) 06/20/2016   Anxiety 06/20/2016   Asthma without status asthmaticus 06/20/2016   Depression 06/20/2016   Fibrocystic breast disease 06/20/2016   Fibromyalgia 06/20/2016   Migraine 06/20/2016   Ovarian cyst 06/20/2016   Morbid obesity ([place]) 02/24/2014   Headache 07/26/2013   KNEE PAIN, BILATERAL 01/02/2010   TENDINITIS, PATELLAR 01/02/2010    ONSET DATE: April 2023  REFERRING DIAG:  G35 (ICD-10-CM) - Multiple sclerosis   THERAPY DIAG:  Weakness of right lower extremity  Muscle weakness (generalized)  Other lack of coordination  Cognitive communication deficit  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  Multiple sclerosis ([place])  Rationale for Evaluation and Treatment: Rehabilitation  SUBJECTIVE:  SUBJECTIVE STATEMENT: Patient states having some left forearm tightness and unrelenting tension compared to the right side.  Pt accompanied by: self  PERTINENT HISTORY: Pt with lengthy subjective history of multiple complications associated with this evaluation.  Pt notes that they are currently having complication from [place] from the 3rd and last time they were diagnosed back in December 2022.  Pt notes that their aphasia has recently been difficult due to being diagnosed with RSV last week and ever since then, has had the aphasia and is much worse today.  Pt utilizing cell phone to communicate with therapist at initial portion of the session, however after anxiety levels lower, pt is able to communicate more effectively.  Pt notes that their speech is more  difficult because their thoughts.word finding is "fuzzy" at this time.  Pt reports that they are tri-lingual and can speak [place], [place], and Sign.  Pt notes no complications with signing and a method of communicating more effectively.    Pt also confirms PCL rupture and is wearing custom DonJoy knee brace on the R LE to assist with stability as they have experienced a patellar dislocation as well.    Pt notes that during a spinal tap in January, the concluded that the pt would need to have an MRI as well and that is when they were diagnosed with MS.  Pt is coming to therapy for all complications listed above.  PAIN: Are you having pain? Yes: NPRS scale: 4/10 Pain location: Feet and Legs, R>L Pain description: Tingling and Cold (Always Cold) Aggravating factors: Walking, running, jumping Relieving factors: Heat  PRECAUTIONS: None  WEIGHT BEARING RESTRICTIONS: No  FALLS: Has patient fallen in last 6 months? No; last fall in April and broke L foot.  LIVING ENVIRONMENT: Lives with: lives with their family Lives in: House/apartment Stairs: Yes: Internal: 13 steps; on right going up Has following equipment at home:  DonJoy Custom R Knee Brace  PLOF: Independent  PATIENT GOALS: Strengthening due to losing a lot of atrophy.  Pt had weight loss surgery 2x.  Pt states the MD is wanting pt to increase the stability of the R knee.  Pt wants to be able to walk without pain.  OBJECTIVE:   DIAGNOSTIC FINDINGS:   EXAM: RIGHT KNEE - COMPLETE 4+ VIEW  IMPRESSION: 1. Moderate suprapatellar joint effusion. 2. Ossific density overlying the medial femoral condyle with overlying soft tissue swelling. In the setting of recent patellar dislocation findings may reflect an avulsion injury or osteochondral defect. Donor site is not apparent on the current imaging. Consider more definitive characterization with MRI or CT of the right knee.   COGNITION: Overall cognitive status: Within  functional limits for tasks assessed and Pt does have hx of aphasia at times.   SENSATION: WFL  COORDINATION: WFL  POSTURE: No Significant postural limitations  LOWER EXTREMITY MMT:    MMT Right Eval Left Eval  Hip flexion 4+ 5  Hip extension 4+ 5  Hip abduction 4+ 5  Hip adduction 4+ 5  Hip internal rotation    Hip external rotation    Knee flexion 4 5  Knee extension 4+ 5  Ankle dorsiflexion 4+ 5  Ankle plantarflexion    Ankle inversion    Ankle eversion    (Blank rows = not tested)   GAIT: Gait pattern: WFL Distance walked: 40' Assistive device utilized: None; DonJoy brace donned on the R LE for stability purposes Level of assistance: Complete Independence Comments: Stiffness noted in the  R LE due to the brace, but otherwise WNL.  FUNCTIONAL TESTS:  5 times sit to stand: 12.83 sec Timed up and go (TUG): Assess at next visit. 6 minute walk test: Assess at next visit. Dynamic Gait Index: Assess at next visit.  PATIENT SURVEYS:  FOTO 36/46  TODAY'S TREATMENT: DATE: 03/12/22   Manual Therapy:   STM to left dorsal forearm - increased taught bands and tightness throughout wrist extensors- digitorum, ECRL, ECRB.     TherEx:  Instruction in passive left wrist extensor stretch using right hand- hold 20 sec x 3.  Toe yoga- Big toe ext with 4 toe flex x 10  Toe yoga- Big toe flex with 4 toe ext x 10 reps Foot flexion/towel scrunches 10x; x 2 sets Big toe extension/flexion AAROM 10x each LE Toe abduction 15x  NMR:   Static stand on firm surface without UE support in // bars - 30 sec (mild unsteadiness- mostly ankle righting reaction)  Static stand on airex pad with feet together -without UE support in // bars - 30 sec x2 (increased unsteadiness)  Dynamic weight shift (Ant to post) in // bars - patient with fair reaction- unstable yet toe ext with posterior weight shift  PATIENT EDUCATION: Education details: Pt educated on role of PT and services provided  during current POC, along with prognosis and information about the clinic. Person educated: Patient Education method: Explanation Education comprehension: verbalized understanding  HOME EXERCISE PROGRAM:  Bilateral foot eversion Alphabet each LE March with df with YTB    GOALS:  Goals reviewed with patient? Yes  SHORT TERM GOALS: Target date: 03/21/2022  Pt will be independent with HEP in order to demonstrate increased ability to perform tasks related to occupation/hobbies. Baseline: FOTO: Goal status: INITIAL   LONG TERM GOALS: Target date: 05/16/2022  1.  Patient will complete five times sit to stand test in < 10 seconds indicating an increased LE strength and improved balance. Baseline: 12.83 sec Goal status: INITIAL  2.  Patient will increase FOTO score to equal to or greater than 46 to demonstrate statistically significant improvement in mobility and quality of life.  Baseline: 36 Goal status: INITIAL   3.  Patient will increase FGA score by > 4 points to demonstrate decreased fall risk during functional activities. Baseline: Not performed at initial evaluation. 12/21: 20/30 Goal status: INITIAL   4.  Patient will reduce timed up and go to <11 seconds to reduce fall risk and demonstrate improved transfer/gait ability. Baseline: Not performed at initial evaluation. 12/21: 6 seconds Goal status: MET  5.  Patient will increase 10 meter walk test to >1.25ms as to improve gait speed for better community ambulation and to reduce fall risk. Baseline: Not performed at initial evaluation. Goal status: INITIAL  6.  Patient will increase six minute walk test distance to >1800 for progression to community ambulator and improve gait ability Baseline: Not performed at initial evaluation. 12/1: 1405 ft Goal status: INITIAL    ASSESSMENT:  CLINICAL IMPRESSION: Patient presents good motivation yet increased tightness in left forearm with minimal improvement with STM today.  Continued focus on foot intrinsic strength with added therex including arch lift with some progress with practice today. Patient was later able to improve with mostly ankle and some hip righting reaction during balance activities.  Patient will benefit from continued skilled PT services to address LE weakness/imbalance to improve functional mobility and decrease any risk of falling.       OBJECTIVE IMPAIRMENTS:  Abnormal  gait, decreased activity tolerance, decreased balance, decreased endurance, decreased knowledge of condition, decreased mobility, difficulty walking, decreased strength, dizziness, impaired sensation, and pain.   ACTIVITY LIMITATIONS: lifting, bending, sitting, standing, squatting, sleeping, stairs, and locomotion level  PARTICIPATION LIMITATIONS: cleaning, laundry, interpersonal relationship, community activity, and occupation  PERSONAL FACTORS: Age, Education, Past/current experiences, Time since onset of injury/illness/exacerbation, and 3+ comorbidities: MS, Depression, Fibromyalgia, Rupture of PCL  are also affecting patient's functional outcome.   REHAB POTENTIAL: Good  CLINICAL DECISION MAKING: Stable/uncomplicated  EVALUATION COMPLEXITY: High  PLAN:  PT FREQUENCY: 2x/week  PT DURATION: 12 weeks  PLANNED INTERVENTIONS: Therapeutic exercises, Therapeutic activity, Neuromuscular re-education, Balance training, Gait training, Patient/Family education, Self Care, Joint mobilization, Stair training, Vestibular training, Canalith repositioning, Dry Needling, Electrical stimulation, Spinal manipulation, Spinal mobilization, Cryotherapy, Moist heat, Traction, and Manual therapy  PLAN FOR NEXT SESSION:  Foot intrinsic musculature strengthening, ankle righting reactions     Lewis Moccasin PT  Physical Therapist- [facility]  03/13/22, 7:42 AM

## 2022-03-14 ENCOUNTER — Ambulatory Visit: Payer: BC Managed Care – PPO

## 2022-03-19 ENCOUNTER — Ambulatory Visit: Payer: BC Managed Care – PPO | Admitting: Physical Therapy

## 2022-03-20 NOTE — Therapy (Signed)
OUTPATIENT PHYSICAL THERAPY NEURO TREATMENT   Patient Name: [name] MRN: [id number] DOB:09-Sep-1980, 42 y.o., adult Today's Date: 03/21/2022   PCP: Paulita Cradle, MD REFERRING PROVIDER: Paulita Cradle, MD  END OF SESSION:  PT End of Session - 03/21/22 1106     Visit Number 6    Number of Visits 24    Date for PT Re-Evaluation 05/16/22    PT Start Time 1105    PT Stop Time 1145    PT Time Calculation (min) 40 min    Activity Tolerance Patient tolerated treatment well    Behavior During Therapy WFL for tasks assessed/performed                 Past Medical History:  Diagnosis Date   Asthma    Breast cyst    Breast mass    Chronic fatigue syndrome    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[place])    Depression    Encephalomyelitis    Fibrocystic breast    Fibromyalgia    Long COVID    Migraines    MS (multiple sclerosis) ([place])    Past Surgical History:  Procedure Laterality Date   BUNIONECTOMY Bilateral    COLONOSCOPY WITH PROPOFOL N/A 08/19/2019   Procedure: COLONOSCOPY WITH PROPOFOL;  Surgeon: Lin Landsman, MD;  Location: [facility] ENDOSCOPY;  Service: Gastroenterology;  Laterality: N/A;   ESOPHAGOGASTRODUODENOSCOPY (EGD) WITH PROPOFOL N/A 08/19/2019   Procedure: ESOPHAGOGASTRODUODENOSCOPY (EGD) WITH PROPOFOL;  Surgeon: Lin Landsman, MD;  Location: [facility] ENDOSCOPY;  Service: Gastroenterology;  Laterality: N/A;   EYE SURGERY     blood clot in eye   HERNIA REPAIR  1991, 2001   inguinal and umbilical   LAPAROSCOPIC GASTRIC SLEEVE RESECTION  2015   TOTAL MASTECTOMY Bilateral    Patient Active Problem List   Diagnosis Date Noted   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   Borderline personality disorder ([place]) 09/21/2020   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/2018   Dislocation of patellofemoral joint 10/13/2017   Labor and delivery indication for care or intervention  02/15/2017   Pregnancy 01/01/2017   Encounter for procreative genetic counseling 37/54/3606   Umbilical hernia without obstruction and without gangrene    Norovirus 06/24/2016   ADHD (attention deficit hyperactivity disorder) 06/20/2016   Anxiety 06/20/2016   Asthma without status asthmaticus 06/20/2016   Depression 06/20/2016   Fibrocystic breast disease 06/20/2016   Fibromyalgia 06/20/2016   Migraine 06/20/2016   Ovarian cyst 06/20/2016   Morbid obesity ([place]) 02/24/2014   Headache 07/26/2013   KNEE PAIN, BILATERAL 01/02/2010   TENDINITIS, PATELLAR 01/02/2010    ONSET DATE: April 2023  REFERRING DIAG:  G35 (ICD-10-CM) - Multiple sclerosis   THERAPY DIAG:  Weakness of right lower extremity  Muscle weakness (generalized)  Other lack of coordination  Rationale for Evaluation and Treatment: Rehabilitation  SUBJECTIVE:  SUBJECTIVE STATEMENT: Patient had sleep study, was found to have no REM per patient report.  Pt accompanied by: self  PERTINENT HISTORY: Pt with lengthy subjective history of multiple complications associated with this evaluation.  Pt notes that they are currently having complication from [place] from the 3rd and last time they were diagnosed back in December 2022.  Pt notes that their aphasia has recently been difficult due to being diagnosed with RSV last week and ever since then, has had the aphasia and is much worse today.  Pt utilizing cell phone to communicate with therapist at initial portion of the session, however after anxiety levels lower, pt is able to communicate more effectively.  Pt notes that their speech is more difficult because their thoughts.word finding is "fuzzy" at this time.  Pt reports that they are tri-lingual and can speak [place], [place], and Sign.   Pt notes no complications with signing and a method of communicating more effectively.    Pt also confirms PCL rupture and is wearing custom DonJoy knee brace on the R LE to assist with stability as they have experienced a patellar dislocation as well.    Pt notes that during a spinal tap in January, the concluded that the pt would need to have an MRI as well and that is when they were diagnosed with MS.  Pt is coming to therapy for all complications listed above.  PAIN: Are you having pain? Yes: NPRS scale: 4/10 Pain location: Feet and Legs, R>L Pain description: Tingling and Cold (Always Cold) Aggravating factors: Walking, running, jumping Relieving factors: Heat  PRECAUTIONS: None  WEIGHT BEARING RESTRICTIONS: No  FALLS: Has patient fallen in last 6 months? No; last fall in April and broke L foot.  LIVING ENVIRONMENT: Lives with: lives with their family Lives in: House/apartment Stairs: Yes: Internal: 13 steps; on right going up Has following equipment at home:  DonJoy Custom R Knee Brace  PLOF: Independent  PATIENT GOALS: Strengthening due to losing a lot of atrophy.  Pt had weight loss surgery 2x.  Pt states the MD is wanting pt to increase the stability of the R knee.  Pt wants to be able to walk without pain.  OBJECTIVE:   DIAGNOSTIC FINDINGS:   EXAM: RIGHT KNEE - COMPLETE 4+ VIEW  IMPRESSION: 1. Moderate suprapatellar joint effusion. 2. Ossific density overlying the medial femoral condyle with overlying soft tissue swelling. In the setting of recent patellar dislocation findings may reflect an avulsion injury or osteochondral defect. Donor site is not apparent on the current imaging. Consider more definitive characterization with MRI or CT of the right knee.   COGNITION: Overall cognitive status: Within functional limits for tasks assessed and Pt does have hx of aphasia at times.   SENSATION: WFL  COORDINATION: WFL  POSTURE: No Significant postural  limitations  LOWER EXTREMITY MMT:    MMT Right Eval Left Eval  Hip flexion 4+ 5  Hip extension 4+ 5  Hip abduction 4+ 5  Hip adduction 4+ 5  Hip internal rotation    Hip external rotation    Knee flexion 4 5  Knee extension 4+ 5  Ankle dorsiflexion 4+ 5  Ankle plantarflexion    Ankle inversion    Ankle eversion    (Blank rows = not tested)   GAIT: Gait pattern: WFL Distance walked: 40' Assistive device utilized: None; DonJoy brace donned on the R LE for stability purposes Level of assistance: Complete Independence Comments: Stiffness noted in the R LE  due to the brace, but otherwise WNL.  FUNCTIONAL TESTS:  5 times sit to stand: 12.83 sec Timed up and go (TUG): Assess at next visit. 6 minute walk test: Assess at next visit. Dynamic Gait Index: Assess at next visit.  PATIENT SURVEYS:  FOTO 36/46  TODAY'S TREATMENT: DATE: 03/21/22      TherEx:  LAQ with adduction ball squeeze 15x Big toe alignment for reduced pain   NMR:   Incline ramp 60 seconds hold Airex pad: dual task word game. With visual scan reaching outside BOS ; switching halfway through x 9 minutes Airex pad ramp modified tandem stance 60 seconds each LE   PATIENT EDUCATION: Education details: Pt educated on role of PT and services provided during current POC, along with prognosis and information about the clinic. Person educated: Patient Education method: Explanation Education comprehension: verbalized understanding  HOME EXERCISE PROGRAM:  Bilateral foot eversion Alphabet each LE March with df with YTB    GOALS:  Goals reviewed with patient? Yes  SHORT TERM GOALS: Target date: 03/21/2022  Pt will be independent with HEP in order to demonstrate increased ability to perform tasks related to occupation/hobbies. Baseline: FOTO: Goal status: INITIAL   LONG TERM GOALS: Target date: 05/16/2022  1.  Patient will complete five times sit to stand test in < 10 seconds indicating an  increased LE strength and improved balance. Baseline: 12.83 sec Goal status: INITIAL  2.  Patient will increase FOTO score to equal to or greater than 46 to demonstrate statistically significant improvement in mobility and quality of life.  Baseline: 36 Goal status: INITIAL   3.  Patient will increase FGA score by > 4 points to demonstrate decreased fall risk during functional activities. Baseline: Not performed at initial evaluation. 12/21: 20/30 Goal status: INITIAL   4.  Patient will reduce timed up and go to <11 seconds to reduce fall risk and demonstrate improved transfer/gait ability. Baseline: Not performed at initial evaluation. 12/21: 6 seconds Goal status: MET  5.  Patient will increase 10 meter walk test to >1.26ms as to improve gait speed for better community ambulation and to reduce fall risk. Baseline: Not performed at initial evaluation. Goal status: INITIAL  6.  Patient will increase six minute walk test distance to >1800 for progression to community ambulator and improve gait ability Baseline: Not performed at initial evaluation. 12/1: 1405 ft Goal status: INITIAL    ASSESSMENT:  CLINICAL IMPRESSION: Patient tolerates progressive ankle righting reaction interventions well. Occasional instability noted with fatigue. Patient is highly motivated throughout session and eager to progress their mobility.  Patient will benefit from continued skilled PT services to address LE weakness/imbalance to improve functional mobility and decrease any risk of falling.       OBJECTIVE IMPAIRMENTS:  Abnormal gait, decreased activity tolerance, decreased balance, decreased endurance, decreased knowledge of condition, decreased mobility, difficulty walking, decreased strength, dizziness, impaired sensation, and pain.   ACTIVITY LIMITATIONS: lifting, bending, sitting, standing, squatting, sleeping, stairs, and locomotion level  PARTICIPATION LIMITATIONS: cleaning, laundry,  interpersonal relationship, community activity, and occupation  PERSONAL FACTORS: Age, Education, Past/current experiences, Time since onset of injury/illness/exacerbation, and 3+ comorbidities: MS, Depression, Fibromyalgia, Rupture of PCL  are also affecting patient's functional outcome.   REHAB POTENTIAL: Good  CLINICAL DECISION MAKING: Stable/uncomplicated  EVALUATION COMPLEXITY: High  PLAN:  PT FREQUENCY: 2x/week  PT DURATION: 12 weeks  PLANNED INTERVENTIONS: Therapeutic exercises, Therapeutic activity, Neuromuscular re-education, Balance training, Gait training, Patient/Family education, Self Care, Joint mobilization, Stair training, Vestibular  training, Canalith repositioning, Dry Needling, Electrical stimulation, Spinal manipulation, Spinal mobilization, Cryotherapy, Moist heat, Traction, and Manual therapy  PLAN FOR NEXT SESSION:  Foot intrinsic musculature strengthening, ankle righting reactions     [facility]  03/21/22, 12:12 PM

## 2022-03-21 ENCOUNTER — Ambulatory Visit: Payer: BC Managed Care – PPO

## 2022-03-21 DIAGNOSIS — M6281 Muscle weakness (generalized): Secondary | ICD-10-CM

## 2022-03-21 DIAGNOSIS — R29898 Other symptoms and signs involving the musculoskeletal system: Secondary | ICD-10-CM | POA: Diagnosis not present

## 2022-03-21 DIAGNOSIS — R278 Other lack of coordination: Secondary | ICD-10-CM

## 2022-03-25 ENCOUNTER — Ambulatory Visit: Payer: BC Managed Care – PPO

## 2022-03-25 DIAGNOSIS — M6281 Muscle weakness (generalized): Secondary | ICD-10-CM

## 2022-03-25 DIAGNOSIS — F419 Anxiety disorder, unspecified: Secondary | ICD-10-CM

## 2022-03-25 DIAGNOSIS — R29898 Other symptoms and signs involving the musculoskeletal system: Secondary | ICD-10-CM | POA: Diagnosis not present

## 2022-03-25 DIAGNOSIS — G35 Multiple sclerosis: Secondary | ICD-10-CM

## 2022-03-25 DIAGNOSIS — R278 Other lack of coordination: Secondary | ICD-10-CM

## 2022-03-25 DIAGNOSIS — R41841 Cognitive communication deficit: Secondary | ICD-10-CM

## 2022-03-25 NOTE — Therapy (Incomplete)
OUTPATIENT PHYSICAL THERAPY NEURO TREATMENT   Patient Name: [name] MRN: [id number] DOB:Nov 26, 1980, 42 y.o., adult Today's Date: 03/26/2022   PCP: Paulita Cradle, MD REFERRING PROVIDER: Paulita Cradle, MD  END OF SESSION:  PT End of Session - 03/25/22 0652     Visit Number 7    Number of Visits 24    Date for PT Re-Evaluation 05/16/22    PT Start Time 1301    PT Stop Time 1342    PT Time Calculation (min) 41 min    Activity Tolerance Patient tolerated treatment well    Behavior During Therapy WFL for tasks assessed/performed                  Past Medical History:  Diagnosis Date   Asthma    Breast cyst    Breast mass    Chronic fatigue syndrome    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[place])    Depression    Encephalomyelitis    Fibrocystic breast    Fibromyalgia    Long COVID    Migraines    MS (multiple sclerosis) ([place])    Past Surgical History:  Procedure Laterality Date   BUNIONECTOMY Bilateral    COLONOSCOPY WITH PROPOFOL N/A 08/19/2019   Procedure: COLONOSCOPY WITH PROPOFOL;  Surgeon: Lin Landsman, MD;  Location: [facility] ENDOSCOPY;  Service: Gastroenterology;  Laterality: N/A;   ESOPHAGOGASTRODUODENOSCOPY (EGD) WITH PROPOFOL N/A 08/19/2019   Procedure: ESOPHAGOGASTRODUODENOSCOPY (EGD) WITH PROPOFOL;  Surgeon: Lin Landsman, MD;  Location: [facility] ENDOSCOPY;  Service: Gastroenterology;  Laterality: N/A;   EYE SURGERY     blood clot in eye   HERNIA REPAIR  1991, 2001   inguinal and umbilical   LAPAROSCOPIC GASTRIC SLEEVE RESECTION  2015   TOTAL MASTECTOMY Bilateral    Patient Active Problem List   Diagnosis Date Noted   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   Borderline personality disorder ([place]) 09/21/2020   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/2018   Dislocation of patellofemoral joint 10/13/2017   Labor and delivery indication for care or intervention  02/15/2017   Pregnancy 01/01/2017   Encounter for procreative genetic counseling 99/83/3825   Umbilical hernia without obstruction and without gangrene    Norovirus 06/24/2016   ADHD (attention deficit hyperactivity disorder) 06/20/2016   Anxiety 06/20/2016   Asthma without status asthmaticus 06/20/2016   Depression 06/20/2016   Fibrocystic breast disease 06/20/2016   Fibromyalgia 06/20/2016   Migraine 06/20/2016   Ovarian cyst 06/20/2016   Morbid obesity ([place]) 02/24/2014   Headache 07/26/2013   KNEE PAIN, BILATERAL 01/02/2010   TENDINITIS, PATELLAR 01/02/2010    ONSET DATE: April 2023  REFERRING DIAG:  G35 (ICD-10-CM) - Multiple sclerosis   THERAPY DIAG:  Weakness of right lower extremity  Muscle weakness (generalized)  Other lack of coordination  Cognitive communication deficit  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  Multiple sclerosis ([place])  Rationale for Evaluation and Treatment: Rehabilitation  SUBJECTIVE:  SUBJECTIVE STATEMENT: Patient reports moving slow today- states achy and sore pretty much all over. States left forearm is still really tight and agrees to session stating "I will do the best I can do."  Pt accompanied by: self  PERTINENT HISTORY: Pt with lengthy subjective history of multiple complications associated with this evaluation.  Pt notes that they are currently having complication from [place] from the 3rd and last time they were diagnosed back in December 2022.  Pt notes that their aphasia has recently been difficult due to being diagnosed with RSV last week and ever since then, has had the aphasia and is much worse today.  Pt utilizing cell phone to communicate with therapist at initial portion of the session, however after anxiety levels lower, pt is  able to communicate more effectively.  Pt notes that their speech is more difficult because their thoughts.word finding is "fuzzy" at this time.  Pt reports that they are tri-lingual and can speak [place], [place], and Sign.  Pt notes no complications with signing and a method of communicating more effectively.    Pt also confirms PCL rupture and is wearing custom DonJoy knee brace on the R LE to assist with stability as they have experienced a patellar dislocation as well.    Pt notes that during a spinal tap in January, the concluded that the pt would need to have an MRI as well and that is when they were diagnosed with MS.  Pt is coming to therapy for all complications listed above.  PAIN: Are you having pain? Yes: NPRS scale: 4/10 Pain location: Feet and Legs, R>L Pain description: Tingling and Cold (Always Cold) Aggravating factors: Walking, running, jumping Relieving factors: Heat  PRECAUTIONS: None  WEIGHT BEARING RESTRICTIONS: No  FALLS: Has patient fallen in last 6 months? No; last fall in April and broke L foot.  LIVING ENVIRONMENT: Lives with: lives with their family Lives in: House/apartment Stairs: Yes: Internal: 13 steps; on right going up Has following equipment at home:  DonJoy Custom R Knee Brace  PLOF: Independent  PATIENT GOALS: Strengthening due to losing a lot of atrophy.  Pt had weight loss surgery 2x.  Pt states the MD is wanting pt to increase the stability of the R knee.  Pt wants to be able to walk without pain.  OBJECTIVE:   DIAGNOSTIC FINDINGS:   EXAM: RIGHT KNEE - COMPLETE 4+ VIEW  IMPRESSION: 1. Moderate suprapatellar joint effusion. 2. Ossific density overlying the medial femoral condyle with overlying soft tissue swelling. In the setting of recent patellar dislocation findings may reflect an avulsion injury or osteochondral defect. Donor site is not apparent on the current imaging. Consider more definitive characterization with MRI or CT of  the right knee.   COGNITION: Overall cognitive status: Within functional limits for tasks assessed and Pt does have hx of aphasia at times.   SENSATION: WFL  COORDINATION: WFL  POSTURE: No Significant postural limitations  LOWER EXTREMITY MMT:    MMT Right Eval Left Eval  Hip flexion 4+ 5  Hip extension 4+ 5  Hip abduction 4+ 5  Hip adduction 4+ 5  Hip internal rotation    Hip external rotation    Knee flexion 4 5  Knee extension 4+ 5  Ankle dorsiflexion 4+ 5  Ankle plantarflexion    Ankle inversion    Ankle eversion    (Blank rows = not tested)   GAIT: Gait pattern: WFL Distance walked: 82' Assistive device utilized: None; DonJoy brace  donned on the R LE for stability purposes Level of assistance: Complete Independence Comments: Stiffness noted in the R LE due to the brace, but otherwise WNL.  FUNCTIONAL TESTS:  5 times sit to stand: 12.83 sec Timed up and go (TUG): Assess at next visit. 6 minute walk test: Assess at next visit. Dynamic Gait Index: Assess at next visit.  PATIENT SURVEYS:  FOTO 36/46  TODAY'S TREATMENT: DATE: 03/26/22      TherEx:  LAQ 2# x 15x each LE- VC for height Seated Hip march 2# AW BLE  Seated hip flex/abd/Add up and over cone x 6 each LE (patient fatigued quickly and more difficulty clearing cone with left LE)  Active calf raises x 10 each LE  Instructed in passive wrist flex/ext stretch x 20 sec hold x 3 on left wrist due to patient reporting feeling very tight.  Added some PVC pipe UE stretching due to report of feeling tight today:  -chest press/scap row x approx 15 reps -Shoulder flex/ABD x approx 15 reps -Shoulder ER overhead AROM (increased tightness on left) Reviewed bicep stretch verbally (PT demo against wall)   PATIENT EDUCATION: Education details: Pt educated throughout session about proper posture and technique with exercises. Improved exercise technique, movement at target joints, use of target muscles after  min to mod verbal, visual, tactile cues.  Person educated: Patient Education method: Special educational needs teacher, TC Education comprehension: verbalized understanding; Return demo  HOME EXERCISE PROGRAM:  Bilateral foot eversion Alphabet each LE March with df with YTB    GOALS:  Goals reviewed with patient? Yes  SHORT TERM GOALS: Target date: 03/21/2022  Pt will be independent with HEP in order to demonstrate increased ability to perform tasks related to occupation/hobbies. Baseline: FOTO: Goal status: INITIAL   LONG TERM GOALS: Target date: 05/16/2022  1.  Patient will complete five times sit to stand test in < 10 seconds indicating an increased LE strength and improved balance. Baseline: 12.83 sec Goal status: INITIAL  2.  Patient will increase FOTO score to equal to or greater than 46 to demonstrate statistically significant improvement in mobility and quality of life.  Baseline: 36 Goal status: INITIAL   3.  Patient will increase FGA score by > 4 points to demonstrate decreased fall risk during functional activities. Baseline: Not performed at initial evaluation. 12/21: 20/30 Goal status: INITIAL   4.  Patient will reduce timed up and go to <11 seconds to reduce fall risk and demonstrate improved transfer/gait ability. Baseline: Not performed at initial evaluation. 12/21: 6 seconds Goal status: MET  5.  Patient will increase 10 meter walk test to >1.61ms as to improve gait speed for better community ambulation and to reduce fall risk. Baseline: Not performed at initial evaluation. Goal status: INITIAL  6.  Patient will increase six minute walk test distance to >1800 for progression to community ambulator and improve gait ability Baseline: Not performed at initial evaluation. 12/1: 1405 ft Goal status: INITIAL    ASSESSMENT:  CLINICAL IMPRESSION: Patient presented today in good spirits yet moving slow and stated very stiff overall today. Patient was educated in some UE ROM to  enhance  functional UE Mobility and some progressive seated therex. Patient exhibited fatigue as limiting factor yet no difficulty following instruction for correct technique.  Patient will benefit from continued skilled PT services to address LE weakness/imbalance to improve functional mobility and decrease any risk of falling.       OBJECTIVE IMPAIRMENTS:  Abnormal gait, decreased activity tolerance,  decreased balance, decreased endurance, decreased knowledge of condition, decreased mobility, difficulty walking, decreased strength, dizziness, impaired sensation, and pain.   ACTIVITY LIMITATIONS: lifting, bending, sitting, standing, squatting, sleeping, stairs, and locomotion level  PARTICIPATION LIMITATIONS: cleaning, laundry, interpersonal relationship, community activity, and occupation  PERSONAL FACTORS: Age, Education, Past/current experiences, Time since onset of injury/illness/exacerbation, and 3+ comorbidities: MS, Depression, Fibromyalgia, Rupture of PCL  are also affecting patient's functional outcome.   REHAB POTENTIAL: Good  CLINICAL DECISION MAKING: Stable/uncomplicated  EVALUATION COMPLEXITY: High  PLAN:  PT FREQUENCY: 2x/week  PT DURATION: 12 weeks  PLANNED INTERVENTIONS: Therapeutic exercises, Therapeutic activity, Neuromuscular re-education, Balance training, Gait training, Patient/Family education, Self Care, Joint mobilization, Stair training, Vestibular training, Canalith repositioning, Dry Needling, Electrical stimulation, Spinal manipulation, Spinal mobilization, Cryotherapy, Moist heat, Traction, and Manual therapy  PLAN FOR NEXT SESSION:  Foot intrinsic musculature strengthening, ankle righting reactions; General UE and LE strengthening and balance training as appropriate.     Ollen Bowl, PT  Physical Therapist- [facility]  03/26/22, 7:02 AM

## 2022-03-26 NOTE — Therapy (Signed)
OUTPATIENT PHYSICAL THERAPY NEURO TREATMENT   Patient Name: [name]: [id number] DOB:14-Feb-1981, 42 y.o., adult Today's Date: 03/27/2022   PCP: Paulita Cradle, MD REFERRING PROVIDER: Paulita Cradle, MD  END OF SESSION:  PT End of Session - 03/27/22 0852     Visit Number 8    Number of Visits 24    Date for PT Re-Evaluation 05/16/22    PT Start Time 0852    PT Stop Time 0930    PT Time Calculation (min) 38 min    Activity Tolerance Patient tolerated treatment well    Behavior During Therapy [facility] for tasks assessed/performed                   Past Medical History:  Diagnosis Date   Asthma    Breast cyst    Breast mass    Chronic fatigue syndrome    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[place])    Depression    Encephalomyelitis    Fibrocystic breast    Fibromyalgia    Long COVID    Migraines    MS (multiple sclerosis) ([place])    Past Surgical History:  Procedure Laterality Date   BUNIONECTOMY Bilateral    COLONOSCOPY WITH PROPOFOL N/A 08/19/2019   Procedure: COLONOSCOPY WITH PROPOFOL;  Surgeon: Lin Landsman, MD;  Location: [facility] ENDOSCOPY;  Service: Gastroenterology;  Laterality: N/A;   ESOPHAGOGASTRODUODENOSCOPY (EGD) WITH PROPOFOL N/A 08/19/2019   Procedure: ESOPHAGOGASTRODUODENOSCOPY (EGD) WITH PROPOFOL;  Surgeon: Lin Landsman, MD;  Location[facility] END[facility]COPY;  Service: Gastroenterology;  Laterality: N/A;   EYE SURGERY     blood clot in eye   HERNIA REPAIR  1991, 2001   inguinal and umbilical   LAPAROSCOPIC GASTRIC SLEEVE RESECTION  2015   TOTAL MASTECTOMY Bilateral    Patient Active Problem List   Diagnosis Date Noted   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   Borderline personality disorder ([place]) 09/21/2020   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/2018   Dislocation of patellofemoral joint 10/13/2017   Labor and delivery indication for care or intervention  02/15/2017   Pregnancy 01/01/2017   Encounter for procreative genetic counseling 88/89/1694   Umbilical hernia without obstruction and without gangrene    Norovirus 06/24/2016   ADHD (attention deficit hyperactivity disorder) 06/20/2016   Anxiety 06/20/2016   Asthma without status asthmaticus 06/20/2016   Depression 06/20/2016   Fibrocystic breast disease 06/20/2016   Fibromyalgia 06/20/2016   Migraine 06/20/2016   Ovarian cyst 06/20/2016   Morbid obesity ([place]) 02/24/2014   Headache 07/26/2013   KNEE PAIN, BILATERAL 01/02/2010   TENDINITIS, PATELLAR 01/02/2010    ONSET DATE: April 2023  REFERRING DIAG:  G35 (ICD-10-CM) - Multiple sclerosis   THERAPY DIAG:  Weakness of right lower extremity  Muscle weakness (generalized)  Other lack of coordination  Rationale for Evaluation and Treatment: Rehabilitation  SUBJECTIVE:  SUBJECTIVE STATEMENT: Patient reports significant discomfort (7/10), arms and feet especially. Has taken medication for it.   Pt accompanied by: self  PERTINENT HISTORY: Pt with lengthy subjective history of multiple complications associated with this evaluation.  Pt notes that they are currently having complication from [place] from the 3rd and last time they were diagnosed back in December 2022.  Pt notes that their aphasia has recently been difficult due to being diagnosed with RSV last week and ever since then, has had the aphasia and is much worse today.  Pt utilizing cell phone to communicate with therapist at initial portion of the session, however after anxiety levels lower, pt is able to communicate more effectively.  Pt notes that their speech is more difficult because their thoughts.word finding is "fuzzy" at this time.  Pt reports that they are tri-lingual and can  speak [place], [place], and Sign.  Pt notes no complications with signing and a method of communicating more effectively.    Pt also confirms PCL rupture and is wearing custom DonJoy knee brace on the R LE to assist with stability as they have experienced a patellar dislocation as well.    Pt notes that during a spinal tap in January, the concluded that the pt would need to have an MRI as well and that is when they were diagnosed with MS.  Pt is coming to therapy for all complications listed above.  PAIN: Are you having pain? Yes: NPRS scale: 4/10 Pain location: Feet and Legs, R>L Pain description: Tingling and Cold (Always Cold) Aggravating factors: Walking, running, jumping Relieving factors: Heat  PRECAUTIONS: None  WEIGHT BEARING RESTRICTIONS: No  FALLS: Has patient fallen in last 6 months? No; last fall in April and broke L foot.  LIVING ENVIRONMENT: Lives with: lives with their family Lives in: House/apartment Stairs: Yes: Internal: 13 steps; on right going up Has following equipment at home:  DonJoy Custom R Knee Brace  PLOF: Independent  PATIENT GOALS: Strengthening due to losing a lot of atrophy.  Pt had weight loss surgery 2x.  Pt states the MD is wanting pt to increase the stability of the R knee.  Pt wants to be able to walk without pain.  OBJECTIVE:   DIAGNOSTIC FINDINGS:   EXAM: RIGHT KNEE - COMPLETE 4+ VIEW  IMPRESSION: 1. Moderate suprapatellar joint effusion. 2. Ossific density overlying the medial femoral condyle with overlying soft tissue swelling. In the setting of recent patellar dislocation findings may reflect an avulsion injury or osteochondral defect. Donor site is not apparent on the current imaging. Consider more definitive characterization with MRI or CT of the right knee.   COGNITION: Overall cognitive status: Within functional limits for tasks assessed and Pt does have hx of aphasia at  times.   SENSATION: WFL  COORDINATION: WFL  POSTURE: No Significant postural limitations  LOWER EXTREMITY MMT:    MMT Right Eval Left Eval  Hip flexion 4+ 5  Hip extension 4+ 5  Hip abduction 4+ 5  Hip adduction 4+ 5  Hip internal rotation    Hip external rotation    Knee flexion 4 5  Knee extension 4+ 5  Ankle dorsiflexion 4+ 5  Ankle plantarflexion    Ankle inversion    Ankle eversion    (Blank rows = not tested)   GAIT: Gait pattern: WFL Distance walked: 40' Assistive device utilized: None; DonJoy brace donned on the R LE for stability purposes Level of assistance: Complete Independence Comments: Stiffness noted in the  R LE due to the brace, but otherwise WNL.  FUNCTIONAL TESTS:  5 times sit to stand: 12.83 sec Timed up and go (TUG): Assess at next visit. 6 minute walk test: Assess at next visit. Dynamic Gait Index: Assess at next visit.  PATIENT SURVEYS:  FOTO 36/46  TODAY'S TREATMENT: DATE: 03/27/22  Large swiss ball forward trunk rollout 10x; focus on slow controlled lengthening of musculature Median nerve glide modification seated 10x  Standing:  Modified lateral lunge onto bosu ball 10x each side Modified curtsy lunge with BUE support 10xe each side  Seated LAQ with adduction ball squeeze 10x  Touch the line 30 seconds x 2 trials    PATIENT EDUCATION: Education details: Pt educated throughout session about proper posture and technique with exercises. Improved exercise technique, movement at target joints, use of target muscles after min to mod verbal, visual, tactile cues.  Person educated: Patient Education method: Special educational needs teacher, TC Education comprehension: verbalized understanding; Return demo  HOME EXERCISE PROGRAM:  Bilateral foot eversion Alphabet each LE March with df with YTB    GOALS:  Goals reviewed with patient? Yes  SHORT TERM GOALS: Target date: 03/21/2022  Pt will be independent with HEP in order to demonstrate  increased ability to perform tasks related to occupation/hobbies. Baseline: FOTO: Goal status: INITIAL   LONG TERM GOALS: Target date: 05/16/2022  1.  Patient will complete five times sit to stand test in < 10 seconds indicating an increased LE strength and improved balance. Baseline: 12.83 sec Goal status: INITIAL  2.  Patient will increase FOTO score to equal to or greater than 46 to demonstrate statistically significant improvement in mobility and quality of life.  Baseline: 36 Goal status: INITIAL   3.  Patient will increase FGA score by > 4 points to demonstrate decreased fall risk during functional activities. Baseline: Not performed at initial evaluation. 12/21: 20/30 Goal status: INITIAL   4.  Patient will reduce timed up and go to <11 seconds to reduce fall risk and demonstrate improved transfer/gait ability. Baseline: Not performed at initial evaluation. 12/21: 6 seconds Goal status: MET  5.  Patient will increase 10 meter walk test to >1.10ms as to improve gait speed for better community ambulation and to reduce fall risk. Baseline: Not performed at initial evaluation. Goal status: INITIAL  6.  Patient will increase six minute walk test distance to >1800 for progression to community ambulator and improve gait ability Baseline: Not performed at initial evaluation. 12/1: 1405 ft Goal status: INITIAL    ASSESSMENT:  CLINICAL IMPRESSION: Patient slightly more limited today due to having increased spasm and tightness in body. Patient remains highly motivated despite discomfort. Median nerve glide modifications significantly improves muscle tension and allows for performance of more challenging interventions.  Patient is more challenged with RLE stabilization this session than LLE. Patient will benefit from continued skilled PT services to address LE weakness/imbalance to improve functional mobility and decrease any risk of falling.       OBJECTIVE IMPAIRMENTS:  Abnormal  gait, decreased activity tolerance, decreased balance, decreased endurance, decreased knowledge of condition, decreased mobility, difficulty walking, decreased strength, dizziness, impaired sensation, and pain.   ACTIVITY LIMITATIONS: lifting, bending, sitting, standing, squatting, sleeping, stairs, and locomotion level  PARTICIPATION LIMITATIONS: cleaning, laundry, interpersonal relationship, community activity, and occupation  PERSONAL FACTORS: Age, Education, Past/current experiences, Time since onset of injury/illness/exacerbation, and 3+ comorbidities: MS, Depression, Fibromyalgia, Rupture of PCL  are also affecting patient's functional outcome.   REHAB POTENTIAL: Good  CLINICAL DECISION MAKING: Stable/uncomplicated  EVALUATION COMPLEXITY: High  PLAN:  PT FREQUENCY: 2x/week  PT DURATION: 12 weeks  PLANNED INTERVENTIONS: Therapeutic exercises, Therapeutic activity, Neuromuscular re-education, Balance training, Gait training, Patient/Family education, Self Care, Joint mobilization, Stair training, Vestibular training, Canalith repositioning, Dry Needling, Electrical stimulation, Spinal manipulation, Spinal mobilization, Cryotherapy, Moist heat, Traction, and Manual therapy  PLAN FOR NEXT SESSION:  Foot intrinsic musculature strengthening, ankle righting reactions; General UE and LE strengthening and balance training as appropriate.     Janna Arch PT   Physical Therapist- [facility]  03/27/22, 8:53 AM

## 2022-03-27 ENCOUNTER — Ambulatory Visit: Payer: BC Managed Care – PPO

## 2022-03-27 DIAGNOSIS — R278 Other lack of coordination: Secondary | ICD-10-CM

## 2022-03-27 DIAGNOSIS — R29898 Other symptoms and signs involving the musculoskeletal system: Secondary | ICD-10-CM | POA: Diagnosis not present

## 2022-03-27 DIAGNOSIS — M6281 Muscle weakness (generalized): Secondary | ICD-10-CM

## 2022-03-28 NOTE — Therapy (Signed)
OUTPATIENT PHYSICAL THERAPY NEURO TREATMENT   Patient Name: [name] MRN: [id number] DOB:Jun 30, 1980, 42 y.o., adult Today's Date: 04/01/2022   PCP: Paulita Cradle, MD REFERRING PROVIDER: Paulita Cradle, MD  END OF SESSION:  PT End of Session - 04/01/22 0941     Visit Number 9    Number of Visits 24    Date for PT Re-Evaluation 05/16/22    PT Start Time 0940    PT Stop Time 8676    PT Time Calculation (min) 34 min    Activity Tolerance Patient tolerated treatment well    Behavior During Therapy WFL for tasks assessed/performed                    Past Medical History:  Diagnosis Date   Asthma    Breast cyst    Breast mass    Chronic fatigue syndrome    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[place])    Depression    Encephalomyelitis    Fibrocystic breast    Fibromyalgia    Long COVID    Migraines    MS (multiple sclerosis) ([place])    Past Surgical History:  Procedure Laterality Date   BUNIONECTOMY Bilateral    COLONOSCOPY WITH PROPOFOL N/A 08/19/2019   Procedure: COLONOSCOPY WITH PROPOFOL;  Surgeon: Lin Landsman, MD;  Location: [facility] ENDOSCOPY;  Service: Gastroenterology;  Laterality: N/A;   ESOPHAGOGASTRODUODENOSCOPY (EGD) WITH PROPOFOL N/A 08/19/2019   Procedure: ESOPHAGOGASTRODUODENOSCOPY (EGD) WITH PROPOFOL;  Surgeon: Lin Landsman, MD;  Location: [facility] ENDOSCOPY;  Service: Gastroenterology;  Laterality: N/A;   EYE SURGERY     blood clot in eye   HERNIA REPAIR  1991, 2001   inguinal and umbilical   LAPAROSCOPIC GASTRIC SLEEVE RESECTION  2015   TOTAL MASTECTOMY Bilateral    Patient Active Problem List   Diagnosis Date Noted   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   Borderline personality disorder ([place]) 09/21/2020   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/2018   Dislocation of patellofemoral joint 10/13/2017   Labor and delivery indication for care or  intervention 02/15/2017   Pregnancy 01/01/2017   Encounter for procreative genetic counseling 72/11/4707   Umbilical hernia without obstruction and without gangrene    Norovirus 06/24/2016   ADHD (attention deficit hyperactivity disorder) 06/20/2016   Anxiety 06/20/2016   Asthma without status asthmaticus 06/20/2016   Depression 06/20/2016   Fibrocystic breast disease 06/20/2016   Fibromyalgia 06/20/2016   Migraine 06/20/2016   Ovarian cyst 06/20/2016   Morbid obesity ([place]) 02/24/2014   Headache 07/26/2013   KNEE PAIN, BILATERAL 01/02/2010   TENDINITIS, PATELLAR 01/02/2010    ONSET DATE: April 2023  REFERRING DIAG:  G35 (ICD-10-CM) - Multiple sclerosis   THERAPY DIAG:  Weakness of right lower extremity  Muscle weakness (generalized)  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  Multiple sclerosis ([place])  Rationale for Evaluation and Treatment: Rehabilitation  SUBJECTIVE:  SUBJECTIVE STATEMENT: Patient presents with L rib/back tension between scapula and spine.   Pt accompanied by: self  PERTINENT HISTORY: Pt with lengthy subjective history of multiple complications associated with this evaluation.  Pt notes that they are currently having complication from [place] from the 3rd and last time they were diagnosed back in December 2022.  Pt notes that their aphasia has recently been difficult due to being diagnosed with RSV last week and ever since then, has had the aphasia and is much worse today.  Pt utilizing cell phone to communicate with therapist at initial portion of the session, however after anxiety levels lower, pt is able to communicate more effectively.  Pt notes that their speech is more difficult because their thoughts.word finding is "fuzzy" at this time.  Pt reports that  they are tri-lingual and can speak [place], [place], and Sign.  Pt notes no complications with signing and a method of communicating more effectively.    Pt also confirms PCL rupture and is wearing custom DonJoy knee brace on the R LE to assist with stability as they have experienced a patellar dislocation as well.    Pt notes that during a spinal tap in January, the concluded that the pt would need to have an MRI as well and that is when they were diagnosed with MS.  Pt is coming to therapy for all complications listed above.  PAIN: Are you having pain? Yes: NPRS scale: 4/10 Pain location: Feet and Legs, R>L Pain description: Tingling and Cold (Always Cold) Aggravating factors: Walking, running, jumping Relieving factors: Heat  PRECAUTIONS: None  WEIGHT BEARING RESTRICTIONS: No  FALLS: Has patient fallen in last 6 months? No; last fall in April and broke L foot.  LIVING ENVIRONMENT: Lives with: lives with their family Lives in: House/apartment Stairs: Yes: Internal: 13 steps; on right going up Has following equipment at home:  DonJoy Custom R Knee Brace  PLOF: Independent  PATIENT GOALS: Strengthening due to losing a lot of atrophy.  Pt had weight loss surgery 2x.  Pt states the MD is wanting pt to increase the stability of the R knee.  Pt wants to be able to walk without pain.  OBJECTIVE:   DIAGNOSTIC FINDINGS:   EXAM: RIGHT KNEE - COMPLETE 4+ VIEW  IMPRESSION: 1. Moderate suprapatellar joint effusion. 2. Ossific density overlying the medial femoral condyle with overlying soft tissue swelling. In the setting of recent patellar dislocation findings may reflect an avulsion injury or osteochondral defect. Donor site is not apparent on the current imaging. Consider more definitive characterization with MRI or CT of the right knee.   COGNITION: Overall cognitive status: Within functional limits for tasks assessed and Pt does have hx of aphasia at  times.   SENSATION: WFL  COORDINATION: WFL  POSTURE: No Significant postural limitations  LOWER EXTREMITY MMT:    MMT Right Eval Left Eval  Hip flexion 4+ 5  Hip extension 4+ 5  Hip abduction 4+ 5  Hip adduction 4+ 5  Hip internal rotation    Hip external rotation    Knee flexion 4 5  Knee extension 4+ 5  Ankle dorsiflexion 4+ 5  Ankle plantarflexion    Ankle inversion    Ankle eversion    (Blank rows = not tested)   GAIT: Gait pattern: WFL Distance walked: 40' Assistive device utilized: None; DonJoy brace donned on the R LE for stability purposes Level of assistance: Complete Independence Comments: Stiffness noted in the R LE due to  the brace, but otherwise WNL.  FUNCTIONAL TESTS:  5 times sit to stand: 12.83 sec Timed up and go (TUG): Assess at next visit. 6 minute walk test: Assess at next visit. Dynamic Gait Index: Assess at next visit.  PATIENT SURVEYS:  FOTO 36/46  TODAY'S TREATMENT: DATE: 04/01/22  Posture eduction: performed wall posture x10   Standing bosu ball flat 30 seconds  Tux Racer x3 minutes x 2 trials; Twisty slopes racer for weight shift and coordination.   Seated: Adduction ball squeeze 10x 5 second holds Heel toe raises 10x Heel toe raises with ball adduction 10x   Prone:  gradeII mobilization to L thoracic spine; cavitation noted x 2 minutes   Donning foot brace and gel for decreased pain on R big toe.    PATIENT EDUCATION: Education details: Pt educated throughout session about proper posture and technique with exercises. Improved exercise technique, movement at target joints, use of target muscles after min to mod verbal, visual, tactile cues.  Person educated: Patient Education method: Special educational needs teacher, TC Education comprehension: verbalized understanding; Return demo  HOME EXERCISE PROGRAM:  Bilateral foot eversion Alphabet each LE March with df with YTB    GOALS:  Goals reviewed with patient? Yes  SHORT TERM  GOALS: Target date: 03/21/2022  Pt will be independent with HEP in order to demonstrate increased ability to perform tasks related to occupation/hobbies. Baseline: FOTO: Goal status: INITIAL   LONG TERM GOALS: Target date: 05/16/2022  1.  Patient will complete five times sit to stand test in < 10 seconds indicating an increased LE strength and improved balance. Baseline: 12.83 sec Goal status: INITIAL  2.  Patient will increase FOTO score to equal to or greater than 46 to demonstrate statistically significant improvement in mobility and quality of life.  Baseline: 36 Goal status: INITIAL   3.  Patient will increase FGA score by > 4 points to demonstrate decreased fall risk during functional activities. Baseline: Not performed at initial evaluation. 12/21: 20/30 Goal status: INITIAL   4.  Patient will reduce timed up and go to <11 seconds to reduce fall risk and demonstrate improved transfer/gait ability. Baseline: Not performed at initial evaluation. 12/21: 6 seconds Goal status: MET  5.  Patient will increase 10 meter walk test to >1.58ms as to improve gait speed for better community ambulation and to reduce fall risk. Baseline: Not performed at initial evaluation. Goal status: INITIAL  6.  Patient will increase six minute walk test distance to >1800 for progression to community ambulator and improve gait ability Baseline: Not performed at initial evaluation. 12/1: 1405 ft Goal status: INITIAL    ASSESSMENT:  CLINICAL IMPRESSION: Patient session limited by late arrival. Mobilization of L upper thoracic and rib region reduced pain and allowed patient to participate in session. Education on use of K tape and gel for pain reduction for R big toe performed. Tux racing is challenged for weight shift to the left but improved with repetition.  Patient will benefit from continued skilled PT services to address LE weakness/imbalance to improve functional mobility and decrease any risk of  falling.       OBJECTIVE IMPAIRMENTS:  Abnormal gait, decreased activity tolerance, decreased balance, decreased endurance, decreased knowledge of condition, decreased mobility, difficulty walking, decreased strength, dizziness, impaired sensation, and pain.   ACTIVITY LIMITATIONS: lifting, bending, sitting, standing, squatting, sleeping, stairs, and locomotion level  PARTICIPATION LIMITATIONS: cleaning, laundry, interpersonal relationship, community activity, and occupation  PERSONAL FACTORS: Age, Education, Past/current experiences, Time since onset  of injury/illness/exacerbation, and 3+ comorbidities: MS, Depression, Fibromyalgia, Rupture of PCL  are also affecting patient's functional outcome.   REHAB POTENTIAL: Good  CLINICAL DECISION MAKING: Stable/uncomplicated  EVALUATION COMPLEXITY: High  PLAN:  PT FREQUENCY: 2x/week  PT DURATION: 12 weeks  PLANNED INTERVENTIONS: Therapeutic exercises, Therapeutic activity, Neuromuscular re-education, Balance training, Gait training, Patient/Family education, Self Care, Joint mobilization, Stair training, Vestibular training, Canalith repositioning, Dry Needling, Electrical stimulation, Spinal manipulation, Spinal mobilization, Cryotherapy, Moist heat, Traction, and Manual therapy  PLAN FOR NEXT SESSION:  Foot intrinsic musculature strengthening, ankle righting reactions; General UE and LE strengthening and balance training as appropriate.     Janna Arch PT   Physical Therapist- [facility]  04/01/22, 11:04 AM

## 2022-04-01 ENCOUNTER — Ambulatory Visit: Payer: BC Managed Care – PPO

## 2022-04-01 DIAGNOSIS — R29898 Other symptoms and signs involving the musculoskeletal system: Secondary | ICD-10-CM | POA: Diagnosis not present

## 2022-04-01 DIAGNOSIS — M6281 Muscle weakness (generalized): Secondary | ICD-10-CM

## 2022-04-01 DIAGNOSIS — F419 Anxiety disorder, unspecified: Secondary | ICD-10-CM

## 2022-04-01 DIAGNOSIS — G35 Multiple sclerosis: Secondary | ICD-10-CM

## 2022-04-02 NOTE — Therapy (Signed)
OUTPATIENT PHYSICAL THERAPY NEURO TREATMENT/ Physical Therapy Progress Note   Dates of reporting period  02/21/22   to   04/03/22    Patient Name: [name] MRN: [id number] DOB:15-Sep-1980, 42 y.o., adult Today's Date: 04/03/2022   PCP: Paulita Cradle, MD REFERRING PROVIDER: Paulita Cradle, MD  END OF SESSION:  PT End of Session - 04/03/22 1434     Visit Number 10    Number of Visits 24    Date for PT Re-Evaluation 05/16/22    PT Start Time 1434    PT Stop Time 1514    PT Time Calculation (min) 40 min    Activity Tolerance Patient tolerated treatment well    Behavior During Therapy WFL for tasks assessed/performed                     Past Medical History:  Diagnosis Date   Asthma    Breast cyst    Breast mass    Chronic fatigue syndrome    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[place])    Depression    Encephalomyelitis    Fibrocystic breast    Fibromyalgia    Long COVID    Migraines    MS (multiple sclerosis) ([place])    Past Surgical History:  Procedure Laterality Date   BUNIONECTOMY Bilateral    COLONOSCOPY WITH PROPOFOL N/A 08/19/2019   Procedure: COLONOSCOPY WITH PROPOFOL;  Surgeon: Lin Landsman, MD;  Location: [facility] ENDOSCOPY;  Service: Gastroenterology;  Laterality: N/A;   ESOPHAGOGASTRODUODENOSCOPY (EGD) WITH PROPOFOL N/A 08/19/2019   Procedure: ESOPHAGOGASTRODUODENOSCOPY (EGD) WITH PROPOFOL;  Surgeon: Lin Landsman, MD;  Location: [facility] ENDOSCOPY;  Service: Gastroenterology;  Laterality: N/A;   EYE SURGERY     blood clot in eye   HERNIA REPAIR  1991, 2001   inguinal and umbilical   LAPAROSCOPIC GASTRIC SLEEVE RESECTION  2015   TOTAL MASTECTOMY Bilateral    Patient Active Problem List   Diagnosis Date Noted   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   Borderline personality disorder ([place]) 09/21/2020   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/2018   Dislocation  of patellofemoral joint 10/13/2017   Labor and delivery indication for care or intervention 02/15/2017   Pregnancy 01/01/2017   Encounter for procreative genetic counseling 40/98/1191   Umbilical hernia without obstruction and without gangrene    Norovirus 06/24/2016   ADHD (attention deficit hyperactivity disorder) 06/20/2016   Anxiety 06/20/2016   Asthma without status asthmaticus 06/20/2016   Depression 06/20/2016   Fibrocystic breast disease 06/20/2016   Fibromyalgia 06/20/2016   Migraine 06/20/2016   Ovarian cyst 06/20/2016   Morbid obesity ([place]) 02/24/2014   Headache 07/26/2013   KNEE PAIN, BILATERAL 01/02/2010   TENDINITIS, PATELLAR 01/02/2010    ONSET DATE: April 2023  REFERRING DIAG:  G35 (ICD-10-CM) - Multiple sclerosis   THERAPY DIAG:  Weakness of right lower extremity  Muscle weakness (generalized)  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  Multiple sclerosis ([place])  Rationale for Evaluation and Treatment: Rehabilitation  SUBJECTIVE:  SUBJECTIVE STATEMENT: Patient reports they feel wobbly today. Had a massage yesterday. Meeting with [place] in beginning of February. Daughters present.  Pt accompanied by: self  PERTINENT HISTORY: Pt with lengthy subjective history of multiple complications associated with this evaluation.  Pt notes that they are currently having complication from [place] from the 3rd and last time they were diagnosed back in December 2022.  Pt notes that their aphasia has recently been difficult due to being diagnosed with RSV last week and ever since then, has had the aphasia and is much worse today.  Pt utilizing cell phone to communicate with therapist at initial portion of the session, however after anxiety levels lower, pt is able to communicate  more effectively.  Pt notes that their speech is more difficult because their thoughts.word finding is "fuzzy" at this time.  Pt reports that they are tri-lingual and can speak [place], [place], and Sign.  Pt notes no complications with signing and a method of communicating more effectively.    Pt also confirms PCL rupture and is wearing custom DonJoy knee brace on the R LE to assist with stability as they have experienced a patellar dislocation as well.    Pt notes that during a spinal tap in January, the concluded that the pt would need to have an MRI as well and that is when they were diagnosed with MS.  Pt is coming to therapy for all complications listed above.  PAIN: Are you having pain? Yes: NPRS scale: 4/10 Pain location: Feet and Legs, R>L Pain description: Tingling and Cold (Always Cold) Aggravating factors: Walking, running, jumping Relieving factors: Heat  PRECAUTIONS: None  WEIGHT BEARING RESTRICTIONS: No  FALLS: Has patient fallen in last 6 months? No; last fall in April and broke L foot.  LIVING ENVIRONMENT: Lives with: lives with their family Lives in: House/apartment Stairs: Yes: Internal: 13 steps; on right going up Has following equipment at home:  DonJoy Custom R Knee Brace  PLOF: Independent  PATIENT GOALS: Strengthening due to losing a lot of atrophy.  Pt had weight loss surgery 2x.  Pt states the MD is wanting pt to increase the stability of the R knee.  Pt wants to be able to walk without pain.  OBJECTIVE:   DIAGNOSTIC FINDINGS:   EXAM: RIGHT KNEE - COMPLETE 4+ VIEW  IMPRESSION: 1. Moderate suprapatellar joint effusion. 2. Ossific density overlying the medial femoral condyle with overlying soft tissue swelling. In the setting of recent patellar dislocation findings may reflect an avulsion injury or osteochondral defect. Donor site is not apparent on the current imaging. Consider more definitive characterization with MRI or CT of the right  knee.   COGNITION: Overall cognitive status: Within functional limits for tasks assessed and Pt does have hx of aphasia at times.   SENSATION: WFL  COORDINATION: WFL  POSTURE: No Significant postural limitations  LOWER EXTREMITY MMT:    MMT Right Eval Left Eval  Hip flexion 4+ 5  Hip extension 4+ 5  Hip abduction 4+ 5  Hip adduction 4+ 5  Hip internal rotation    Hip external rotation    Knee flexion 4 5  Knee extension 4+ 5  Ankle dorsiflexion 4+ 5  Ankle plantarflexion    Ankle inversion    Ankle eversion    (Blank rows = not tested)   GAIT: Gait pattern: WFL Distance walked: 40' Assistive device utilized: None; DonJoy brace donned on the R LE for stability purposes Level of assistance: Complete Independence  Comments: Stiffness noted in the R LE due to the brace, but otherwise WNL.  FUNCTIONAL TESTS:  5 times sit to stand: 12.83 sec Timed up and go (TUG): Assess at next visit. 6 minute walk test: Assess at next visit. Dynamic Gait Index: Assess at next visit.  PATIENT SURVEYS:  FOTO 36/46  TODAY'S TREATMENT: DATE: 04/03/22 Goals: see below for details  Treatment Seated on dynadisc: -throw ball x 3 minutes -march 10 each side -opposite UE/LE raise 10x each LE   PATIENT EDUCATION: Education details: Pt educated throughout session about proper posture and technique with exercises. Improved exercise technique, movement at target joints, use of target muscles after min to mod verbal, visual, tactile cues.  Person educated: Patient Education method: Special educational needs teacher, TC Education comprehension: verbalized understanding; Return demo  HOME EXERCISE PROGRAM:  Bilateral foot eversion Alphabet each LE March with df with YTB    GOALS:  Goals reviewed with patient? Yes  SHORT TERM GOALS: Target date: 03/21/2022  Pt will be independent with HEP in order to demonstrate increased ability to perform tasks related to occupation/hobbies. Baseline: 1/24:  HEP compliant  Goal status:MET   LONG TERM GOALS: Target date: 05/16/2022  1.  Patient will complete five times sit to stand test in < 10 seconds indicating an increased LE strength and improved balance. Baseline: 12.83 sec 1/24: 9 seconds Goal status: MET  2.  Patient will increase FOTO score to equal to or greater than 46 to demonstrate statistically significant improvement in mobility and quality of life.  Baseline: 36 1/24: 40% Goal status: Partially Met   3.  Patient will increase FGA score by > 4 points to demonstrate decreased fall risk during functional activities. Baseline: Not performed at initial evaluation. 12/21: 20/30 1/24: 23% Goal status: Partially Met   4.  Patient will reduce timed up and go to <11 seconds to reduce fall risk and demonstrate improved transfer/gait ability. Baseline: Not performed at initial evaluation. 12/21: 6 seconds Goal status: MET  5.  Patient will increase 10 meter walk test to >1.62ms as to improve gait speed for better community ambulation and to reduce fall risk. Baseline: Not performed at initial evaluation. 1/24: 6.5 seconds =1.5 m/s Goal status: MET   6.  Patient will increase six minute walk test distance to >1800 for progression to community ambulator and improve gait ability Baseline: Not performed at initial evaluation. 12/1: 1405 ft 1/24: perform next session.  Goal status: Partialy Met     ASSESSMENT:  CLINICAL IMPRESSION: Patient met 10 MWT and 5x STS goals. Significant progress made towards FGA and FOTO goals. Will assess 6 minute walk test next session.  Core stabilization interventions challenged  patient and provided good insight into carryover with stability. Patient's condition has the potential to improve in response to therapy. Maximum improvement is yet to be obtained. The anticipated improvement is attainable and reasonable in a generally predictable time. Patient will benefit from continued skilled PT services to  address LE weakness/imbalance to improve functional mobility and decrease any risk of falling.       OBJECTIVE IMPAIRMENTS:  Abnormal gait, decreased activity tolerance, decreased balance, decreased endurance, decreased knowledge of condition, decreased mobility, difficulty walking, decreased strength, dizziness, impaired sensation, and pain.   ACTIVITY LIMITATIONS: lifting, bending, sitting, standing, squatting, sleeping, stairs, and locomotion level  PARTICIPATION LIMITATIONS: cleaning, laundry, interpersonal relationship, community activity, and occupation  PERSONAL FACTORS: Age, Education, Past/current experiences, Time since onset of injury/illness/exacerbation, and 3+ comorbidities: MS, Depression, Fibromyalgia, Rupture  of PCL  are also affecting patient's functional outcome.   REHAB POTENTIAL: Good  CLINICAL DECISION MAKING: Stable/uncomplicated  EVALUATION COMPLEXITY: High  PLAN:  PT FREQUENCY: 2x/week  PT DURATION: 12 weeks  PLANNED INTERVENTIONS: Therapeutic exercises, Therapeutic activity, Neuromuscular re-education, Balance training, Gait training, Patient/Family education, Self Care, Joint mobilization, Stair training, Vestibular training, Canalith repositioning, Dry Needling, Electrical stimulation, Spinal manipulation, Spinal mobilization, Cryotherapy, Moist heat, Traction, and Manual therapy  PLAN FOR NEXT SESSION:  6 min walk test     [facility]  04/03/22, 4:16 PM

## 2022-04-03 ENCOUNTER — Encounter: Payer: BC Managed Care – PPO | Admitting: Physical Therapy

## 2022-04-03 ENCOUNTER — Ambulatory Visit: Payer: BC Managed Care – PPO

## 2022-04-03 DIAGNOSIS — R29898 Other symptoms and signs involving the musculoskeletal system: Secondary | ICD-10-CM | POA: Diagnosis not present

## 2022-04-03 DIAGNOSIS — M6281 Muscle weakness (generalized): Secondary | ICD-10-CM

## 2022-04-03 DIAGNOSIS — G35 Multiple sclerosis: Secondary | ICD-10-CM

## 2022-04-03 DIAGNOSIS — U099 Post covid-19 condition, unspecified: Secondary | ICD-10-CM

## 2022-04-08 ENCOUNTER — Ambulatory Visit: Payer: BC Managed Care – PPO

## 2022-04-08 DIAGNOSIS — R278 Other lack of coordination: Secondary | ICD-10-CM

## 2022-04-08 DIAGNOSIS — M6281 Muscle weakness (generalized): Secondary | ICD-10-CM

## 2022-04-08 DIAGNOSIS — R29898 Other symptoms and signs involving the musculoskeletal system: Secondary | ICD-10-CM | POA: Diagnosis not present

## 2022-04-08 DIAGNOSIS — G35 Multiple sclerosis: Secondary | ICD-10-CM

## 2022-04-08 DIAGNOSIS — U099 Post covid-19 condition, unspecified: Secondary | ICD-10-CM

## 2022-04-08 NOTE — Therapy (Signed)
OUTPATIENT PHYSICAL THERAPY NEURO TREATMENT   Patient Name: [name] MRN: [id number] DOB:[birth year], 42 y.o., adult Today's Date: 04/08/2022   PCP: Paulita Cradle, MD REFERRING PROVIDER: Paulita Cradle, MD  END OF SESSION:  PT End of Session - 04/08/22 1103     Visit Number 11    Number of Visits 24    Date for PT Re-Evaluation 05/16/22    PT Start Time 1102    PT Stop Time 1145    PT Time Calculation (min) 43 min    Activity Tolerance Patient tolerated treatment well    Behavior During Therapy WFL for tasks assessed/performed                     Past Medical History:  Diagnosis Date   Asthma    Breast cyst    Breast mass    Chronic fatigue syndrome    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[place])    Depression    Encephalomyelitis    Fibrocystic breast    Fibromyalgia    Long COVID    Migraines    MS (multiple sclerosis) ([place])    Past Surgical History:  Procedure Laterality Date   BUNIONECTOMY Bilateral    COLONOSCOPY WITH PROPOFOL N/A 08/19/2019   Procedure: COLONOSCOPY WITH PROPOFOL;  Surgeon: Lin Landsman, MD;  Location: [facility] ENDOSCOPY;  Service: Gastroenterology;  Laterality: N/A;   ESOPHAGOGASTRODUODENOSCOPY (EGD) WITH PROPOFOL N/A 08/19/2019   Procedure: ESOPHAGOGASTRODUODENOSCOPY (EGD) WITH PROPOFOL;  Surgeon: Lin Landsman, MD;  Location: [facility] ENDOSCOPY;  Service: Gastroenterology;  Laterality: N/A;   EYE SURGERY     blood clot in eye   HERNIA REPAIR  1991, 2001   inguinal and umbilical   LAPAROSCOPIC GASTRIC SLEEVE RESECTION  2015   TOTAL MASTECTOMY Bilateral    Patient Active Problem List   Diagnosis Date Noted   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   Borderline personality disorder ([place]) 09/21/2020   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/2018   Dislocation of patellofemoral joint 10/13/2017   Labor and delivery indication for care or  intervention 02/15/2017   Pregnancy 01/01/2017   Encounter for procreative genetic counseling 60/60/0459   Umbilical hernia without obstruction and without gangrene    Norovirus 06/24/2016   ADHD (attention deficit hyperactivity disorder) 06/20/2016   Anxiety 06/20/2016   Asthma without status asthmaticus 06/20/2016   Depression 06/20/2016   Fibrocystic breast disease 06/20/2016   Fibromyalgia 06/20/2016   Migraine 06/20/2016   Ovarian cyst 06/20/2016   Morbid obesity ([place]) 02/24/2014   Headache 07/26/2013   KNEE PAIN, BILATERAL 01/02/2010   TENDINITIS, PATELLAR 01/02/2010    ONSET DATE: April 2023  REFERRING DIAG:  G35 (ICD-10-CM) - Multiple sclerosis   THERAPY DIAG:  Weakness of right lower extremity  Muscle weakness (generalized)  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  Multiple sclerosis (HCC)  Other lack of coordination  Rationale for Evaluation and Treatment: Rehabilitation  SUBJECTIVE:  SUBJECTIVE STATEMENT: Pt reports onset of migraine today with continuous discomfort in L upper thoracic/periscapular region.   Pt accompanied by: self  PERTINENT HISTORY: Pt with lengthy subjective history of multiple complications associated with this evaluation.  Pt notes that they are currently having complication from [place] from the 3rd and last time they were diagnosed back in December 2022.  Pt notes that their aphasia has recently been difficult due to being diagnosed with RSV last week and ever since then, has had the aphasia and is much worse today.  Pt utilizing cell phone to communicate with therapist at initial portion of the session, however after anxiety levels lower, pt is able to communicate more effectively.  Pt notes that their speech is more difficult because their  thoughts.word finding is "fuzzy" at this time.  Pt reports that they are tri-lingual and can speak [place], [place], and Sign.  Pt notes no complications with signing and a method of communicating more effectively.    Pt also confirms PCL rupture and is wearing custom DonJoy knee brace on the R LE to assist with stability as they have experienced a patellar dislocation as well.    Pt notes that during a spinal tap in January, the concluded that the pt would need to have an MRI as well and that is when they were diagnosed with MS.  Pt is coming to therapy for all complications listed above.  PAIN: Are you having pain? Yes: NPRS scale: 4/10 Pain location: Feet and Legs, R>L Pain description: Tingling and Cold (Always Cold) Aggravating factors: Walking, running, jumping Relieving factors: Heat  PRECAUTIONS: None  WEIGHT BEARING RESTRICTIONS: No  FALLS: Has patient fallen in last 6 months? No; last fall in April and broke L foot.  LIVING ENVIRONMENT: Lives with: lives with their family Lives in: House/apartment Stairs: Yes: Internal: 13 steps; on right going up Has following equipment at home:  DonJoy Custom R Knee Brace  PLOF: Independent  PATIENT GOALS: Strengthening due to losing a lot of atrophy.  Pt had weight loss surgery 2x.  Pt states the MD is wanting pt to increase the stability of the R knee.  Pt wants to be able to walk without pain.  OBJECTIVE:   DIAGNOSTIC FINDINGS:   EXAM: RIGHT KNEE - COMPLETE 4+ VIEW  IMPRESSION: 1. Moderate suprapatellar joint effusion. 2. Ossific density overlying the medial femoral condyle with overlying soft tissue swelling. In the setting of recent patellar dislocation findings may reflect an avulsion injury or osteochondral defect. Donor site is not apparent on the current imaging. Consider more definitive characterization with MRI or CT of the right knee.   COGNITION: Overall cognitive status: Within functional limits for tasks  assessed and Pt does have hx of aphasia at times.   SENSATION: WFL  COORDINATION: WFL  POSTURE: No Significant postural limitations  LOWER EXTREMITY MMT:    MMT Right Eval Left Eval  Hip flexion 4+ 5  Hip extension 4+ 5  Hip abduction 4+ 5  Hip adduction 4+ 5  Hip internal rotation    Hip external rotation    Knee flexion 4 5  Knee extension 4+ 5  Ankle dorsiflexion 4+ 5  Ankle plantarflexion    Ankle inversion    Ankle eversion    (Blank rows = not tested)   GAIT: Gait pattern: WFL Distance walked: 40' Assistive device utilized: None; DonJoy brace donned on the R LE for stability purposes Level of assistance: Complete Independence Comments: Stiffness noted in the  R LE due to the brace, but otherwise WNL.  FUNCTIONAL TESTS:  5 times sit to stand: 12.83 sec Timed up and go (TUG): Assess at next visit. 6 minute walk test: Assess at next visit. Dynamic Gait Index: Assess at next visit.  PATIENT SURVEYS:  FOTO 36/46  TODAY'S TREATMENT: DATE: 04/08/22  There.ex:   Finished up goal reassessment. Pt completing 1500' on 6MWT which is 38' improved from eval. Anticipate pt would have ambulated further distance as they needed to slow down and navigate crowded PT gym throughout session.    Standing wall angels: x12     Standing T row: 12.5 lbs, 2x12. Mod multimodal cuing for form/technique. Excellent carryover after cuing.    Standing row: 12.5 lbs, 2x12. Mod multimodal cuing for form/technique. Excellent carryover after cuing.     Manual Therapy: 15 min total in prone and R side lying    Prone Grade 3 PA thoracic mobs from T1-T7, 2x10 sec bouts/segment for improved thoracic and periscapular mobility   5 minutes STM in prone to L middle trap and rhomboids with twitch response and pt reporting concordant symptoms with palpation. They report symptoms improve during STM with reduced migraine symptoms.    R side lying, L Scapular mobilizations upward/downward  rotations, superior/inferior, protraction/retraction, x12/plane of motion for scapular mobility, postural re-ed with L shoulder mobility and improved soft tissue extensibility.    PATIENT EDUCATION: Education details: Pt educated throughout session about proper posture and technique with exercises. Improved exercise technique, movement at target joints, use of target muscles after min to mod verbal, visual, tactile cues.  Person educated: Patient Education method: Special educational needs teacher, TC Education comprehension: verbalized understanding; Return demo  HOME EXERCISE PROGRAM:  Bilateral foot eversion Alphabet each LE March with df with YTB    GOALS:  Goals reviewed with patient? Yes  SHORT TERM GOALS: Target date: 03/21/2022  Pt will be independent with HEP in order to demonstrate increased ability to perform tasks related to occupation/hobbies. Baseline: 1/24: HEP compliant  Goal status:MET   LONG TERM GOALS: Target date: 05/16/2022  1.  Patient will complete five times sit to stand test in < 10 seconds indicating an increased LE strength and improved balance. Baseline: 12.83 sec 1/24: 9 seconds Goal status: MET  2.  Patient will increase FOTO score to equal to or greater than 46 to demonstrate statistically significant improvement in mobility and quality of life.  Baseline: 36 1/24: 40% Goal status: Partially Met   3.  Patient will increase FGA score by > 4 points to demonstrate decreased fall risk during functional activities. Baseline: Not performed at initial evaluation. 12/21: 20/30 1/24: 23% Goal status: Partially Met   4.  Patient will reduce timed up and go to <11 seconds to reduce fall risk and demonstrate improved transfer/gait ability. Baseline: Not performed at initial evaluation. 12/21: 6 seconds Goal status: MET  5.  Patient will increase 10 meter walk test to >1.2ms as to improve gait speed for better community ambulation and to reduce fall risk. Baseline: Not  performed at initial evaluation. 1/24: 6.5 seconds =1.5 m/s Goal status: MET   6.  Patient will increase six minute walk test distance to >1800 for progression to community ambulator and improve gait ability Baseline: Not performed at initial evaluation. 12/1: 1405 ft 1/24: perform next session.; 04/08/22: 1500' Goal status: Partialy Met     ASSESSMENT:  CLINICAL IMPRESSION: Pt making progress towards their 6MWT goal. Some limitations in distance due to human traffic in  crowded PT gym causing pt multiple bouts of slowing down and navigating around other patients. Majority of session focused on improving L shoulder and migraine related pain and continued postural corrections. Pt reports improvement in L shoulder pain and migraine pain related to concordant trigger point in L middle trap/rhomboid region. After manual intervention, followed up with therex to target middle trap and rhomboid mobility for long term improvement in symptoms. Patient will benefit from continued skilled PT services to address LE weakness/imbalance to improve functional mobility and decrease any risk of falling.     OBJECTIVE IMPAIRMENTS:  Abnormal gait, decreased activity tolerance, decreased balance, decreased endurance, decreased knowledge of condition, decreased mobility, difficulty walking, decreased strength, dizziness, impaired sensation, and pain.   ACTIVITY LIMITATIONS: lifting, bending, sitting, standing, squatting, sleeping, stairs, and locomotion level  PARTICIPATION LIMITATIONS: cleaning, laundry, interpersonal relationship, community activity, and occupation  PERSONAL FACTORS: Age, Education, Past/current experiences, Time since onset of injury/illness/exacerbation, and 3+ comorbidities: MS, Depression, Fibromyalgia, Rupture of PCL  are also affecting patient's functional outcome.   REHAB POTENTIAL: Good  CLINICAL DECISION MAKING: Stable/uncomplicated  EVALUATION COMPLEXITY: High  PLAN:  PT  FREQUENCY: 2x/week  PT DURATION: 12 weeks  PLANNED INTERVENTIONS: Therapeutic exercises, Therapeutic activity, Neuromuscular re-education, Balance training, Gait training, Patient/Family education, Self Care, Joint mobilization, Stair training, Vestibular training, Canalith repositioning, Dry Needling, Electrical stimulation, Spinal manipulation, Spinal mobilization, Cryotherapy, Moist heat, Traction, and Manual therapy  PLAN FOR NEXT SESSION: Continue PT POC   Nestor Wieneke M. Fairly IV, PT, DPT Physical Therapist- [facility]  04/08/22, 12:10 PM

## 2022-04-10 ENCOUNTER — Ambulatory Visit: Payer: BC Managed Care – PPO

## 2022-04-15 ENCOUNTER — Ambulatory Visit: Payer: BC Managed Care – PPO | Attending: Physician Assistant

## 2022-04-15 DIAGNOSIS — M6281 Muscle weakness (generalized): Secondary | ICD-10-CM | POA: Diagnosis present

## 2022-04-15 DIAGNOSIS — R278 Other lack of coordination: Secondary | ICD-10-CM | POA: Diagnosis present

## 2022-04-15 DIAGNOSIS — U099 Post covid-19 condition, unspecified: Secondary | ICD-10-CM | POA: Insufficient documentation

## 2022-04-15 DIAGNOSIS — R29898 Other symptoms and signs involving the musculoskeletal system: Secondary | ICD-10-CM | POA: Insufficient documentation

## 2022-04-15 DIAGNOSIS — G35 Multiple sclerosis: Secondary | ICD-10-CM | POA: Insufficient documentation

## 2022-04-15 DIAGNOSIS — F419 Anxiety disorder, unspecified: Secondary | ICD-10-CM | POA: Diagnosis present

## 2022-04-15 NOTE — Therapy (Signed)
OUTPATIENT PHYSICAL THERAPY NEURO TREATMENT   Patient Name: [name] MRN: [id number] DOB:1980-11-10, 42 y.o., adult Today's Date: 04/08/2022   PCP: Paulita Cradle, MD REFERRING PROVIDER: Paulita Cradle, MD  END OF SESSION:   04/15/22 1111  PT Visits / Re-Eval  Visit Number 12  Number of Visits 24  Date for PT Re-Evaluation 05/16/22  PT Time Calculation  PT Start Time 1109  PT Stop Time 1140  PT Time Calculation (min) 31 min  PT - End of Session  Activity Tolerance Patient tolerated treatment well  Behavior During Therapy WFL for tasks assessed/performed     Past Medical History:  Diagnosis Date   Asthma    Breast cyst    Breast mass    Chronic fatigue syndrome    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[place])    Depression    Encephalomyelitis    Fibrocystic breast    Fibromyalgia    Long COVID    Migraines    MS (multiple sclerosis) ([place])    Past Surgical History:  Procedure Laterality Date   BUNIONECTOMY Bilateral    COLONOSCOPY WITH PROPOFOL N/A 08/19/2019   Procedure: COLONOSCOPY WITH PROPOFOL;  Surgeon: Lin Landsman, MD;  Location: [facility] ENDOSCOPY;  Service: Gastroenterology;  Laterality: N/A;   ESOPHAGOGASTRODUODENOSCOPY (EGD) WITH PROPOFOL N/A 08/19/2019   Procedure: ESOPHAGOGASTRODUODENOSCOPY (EGD) WITH PROPOFOL;  Surgeon: Lin Landsman, MD;  Location: [facility] ENDOSCOPY;  Service: Gastroenterology;  Laterality: N/A;   EYE SURGERY     blood clot in eye   HERNIA REPAIR  1991, 2001   inguinal and umbilical   LAPAROSCOPIC GASTRIC SLEEVE RESECTION  2015   TOTAL MASTECTOMY Bilateral    Patient Active Problem List   Diagnosis Date Noted   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   Borderline personality disorder ([place]) 09/21/2020   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/2018   Dislocation of patellofemoral joint 10/13/2017   Labor and delivery indication for care or  intervention 02/15/2017   Pregnancy 01/01/2017   Encounter for procreative genetic counseling 87/68/1157   Umbilical hernia without obstruction and without gangrene    Norovirus 06/24/2016   ADHD (attention deficit hyperactivity disorder) 06/20/2016   Anxiety 06/20/2016   Asthma without status asthmaticus 06/20/2016   Depression 06/20/2016   Fibrocystic breast disease 06/20/2016   Fibromyalgia 06/20/2016   Migraine 06/20/2016   Ovarian cyst 06/20/2016   Morbid obesity ([place]) 02/24/2014   Headache 07/26/2013   KNEE PAIN, BILATERAL 01/02/2010   TENDINITIS, PATELLAR 01/02/2010    ONSET DATE: April 2023  REFERRING DIAG:  G35 (ICD-10-CM) - Multiple sclerosis   THERAPY DIAG:  Weakness of right lower extremity  Muscle weakness (generalized)  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  Multiple sclerosis (HCC)  Other lack of coordination  Rationale for Evaluation and Treatment: Rehabilitation  SUBJECTIVE:  SUBJECTIVE STATEMENT: Pt reports feeling much improved. Things going well in general with activity at home. Pain has been well managed.   Pt accompanied by: self  PERTINENT HISTORY:   Pt with lengthy subjective history of multiple complications associated with this evaluation.  Pt reports significant impairment 2/2 to long-covid diagnosed December 2022, particularly expressive aphasia.  Pt notes aphasia worse since RSV infection in early Dec 2023. In PT eval here, pt uses mobile phone app for expressive language, with improved use of verbal expressive language as anxiety decreases- pt reports significant and fluctuating anomia. Baseline pt speaks [place], [place], and ASL, sometimes is more easily able to express self via ASL. At eval pt using don joy knee brace RLE due to Hx PCL rupture  and hx of patella dislocation. Pt diagnosed with MS.   PAIN: No pain today  PRECAUTIONS: None  WEIGHT BEARING RESTRICTIONS: No  FALLS: Has patient fallen in last 6 months? No; last fall in April and broke L foot.  PLOF: Independent  PATIENT GOALS: Strengthening due to losing a lot of atrophy.  Pt had weight loss surgery 2x.  Pt states the MD is wanting pt to increase the stability of the R knee.  Pt wants to be able to walk without pain.  OBJECTIVE:   TODAY'S TREATMENT: DATE: 04/15/22 -Overground AMB 2 laps lower level, 4'20" -STS from chair, hands on knees x10 -repeat extension over monster band 1x10 @ T6, 1x10 @ T5 -repeat extension over mobilization belt 1x10 @ T5, 1x10 @ T8, 1x10 @ T6  -during seated recovery, pt lightheaded, clammy, vitals assessed  -118/12mHg, 86bpm  - did not get to cable rows    PATIENT EDUCATION: Education details: Pt educated throughout session about improved exercise technique, movement at target joints, use of target muscles after min to mod verbal, visual, tactile cues.  Person educated: Patient Education method: ESpecial educational needs teacher TC Education comprehension: verbalized understanding; Return demo  HOME EXERCISE PROGRAM:  Bilateral foot eversion Alphabet each LE March with df with YTB    GOALS:  Goals reviewed with patient? Yes  SHORT TERM GOALS: Target date: 03/21/2022  Pt will be independent with HEP in order to demonstrate increased ability to perform tasks related to occupation/hobbies. Baseline: 1/24: HEP compliant  Goal status:MET   LONG TERM GOALS: Target date: 05/16/2022  1.  Patient will complete five times sit to stand test in < 10 seconds indicating an increased LE strength and improved balance. Baseline: 12.83 sec 1/24: 9 seconds Goal status: MET  2.  Patient will increase FOTO score to equal to or greater than 46 to demonstrate statistically significant improvement in mobility and quality of life.  Baseline: 36 1/24:  40% Goal status: Partially Met   3.  Patient will increase FGA score by > 4 points to demonstrate decreased fall risk during functional activities. Baseline: Not performed at initial evaluation. 12/21: 20/30 1/24: 23% Goal status: Partially Met   4.  Patient will reduce timed up and go to <11 seconds to reduce fall risk and demonstrate improved transfer/gait ability. Baseline: Not performed at initial evaluation. 12/21: 6 seconds Goal status: MET  5.  Patient will increase 10 meter walk test to >1.031m as to improve gait speed for better community ambulation and to reduce fall risk. Baseline: Not performed at initial evaluation. 1/24: 6.5 seconds =1.5 m/s Goal status: MET   6.  Patient will increase six minute walk test distance to >1800 for progression to community ambulator and improve gait ability  Baseline: Not performed at initial evaluation. 12/1: 1405 ft 1/24: perform next session.; 04/08/22: 1500' Goal status: Partialy Met     ASSESSMENT:  CLINICAL IMPRESSION: Continued focus on prior interventions. Pt has an episode of insidious onset dizziness, VSS. Pt conitnues to make progress toward goals. Patient will benefit from continued skilled PT services to address LE weakness/imbalance to improve functional mobility and decrease any risk of falling.     OBJECTIVE IMPAIRMENTS:  Abnormal gait, decreased activity tolerance, decreased balance, decreased endurance, decreased knowledge of condition, decreased mobility, difficulty walking, decreased strength, dizziness, impaired sensation, and pain.   ACTIVITY LIMITATIONS: lifting, bending, sitting, standing, squatting, sleeping, stairs, and locomotion level  PARTICIPATION LIMITATIONS: cleaning, laundry, interpersonal relationship, community activity, and occupation  PERSONAL FACTORS: Age, Education, Past/current experiences, Time since onset of injury/illness/exacerbation, and 3+ comorbidities: MS, Depression, Fibromyalgia, Rupture of  PCL  are also affecting patient's functional outcome.   REHAB POTENTIAL: Good  CLINICAL DECISION MAKING: Stable/uncomplicated  EVALUATION COMPLEXITY: High  PLAN:  PT FREQUENCY: 2x/week  PT DURATION: 12 weeks  PLANNED INTERVENTIONS: Therapeutic exercises, Therapeutic activity, Neuromuscular re-education, Balance training, Gait training, Patient/Family education, Self Care, Joint mobilization, Stair training, Vestibular training, Canalith repositioning, Dry Needling, Electrical stimulation, Spinal manipulation, Spinal mobilization, Cryotherapy, Moist heat, Traction, and Manual therapy  PLAN FOR NEXT SESSION: Continue PT POC    11:13 AM, 04/15/22 Etta Grandchild, PT, DPT Physical Therapist - [facility]  [phone] ([place])     04/08/22, 12:10 PM

## 2022-04-17 ENCOUNTER — Ambulatory Visit: Payer: BC Managed Care – PPO | Admitting: Physical Therapy

## 2022-04-17 DIAGNOSIS — R29898 Other symptoms and signs involving the musculoskeletal system: Secondary | ICD-10-CM

## 2022-04-17 DIAGNOSIS — F419 Anxiety disorder, unspecified: Secondary | ICD-10-CM

## 2022-04-17 DIAGNOSIS — G35 Multiple sclerosis: Secondary | ICD-10-CM

## 2022-04-17 DIAGNOSIS — M6281 Muscle weakness (generalized): Secondary | ICD-10-CM

## 2022-04-17 NOTE — Therapy (Signed)
OUTPATIENT PHYSICAL THERAPY NEURO TREATMENT    Patient Name: [name] MRN: [id number] DOB:February 25, 1981, 42 y.o., adult Today's Date: 04/17/2022   PCP: Paulita Cradle, MD REFERRING PROVIDER: Paulita Cradle, MD  END OF SESSION:   PT End of Session - 04/17/22 1102     Visit Number 13    Number of Visits 24    Date for PT Re-Evaluation 05/16/22    Activity Tolerance Patient tolerated treatment well    Behavior During Therapy [facility] for tasks assessed/performed              Past Medical History:  Diagnosis Date   Asthma    Breast cyst    Breast mass    Chronic fatigue syndrome    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[place])    Depression    Encephalomyelitis    Fibrocystic breast    Fibromyalgia    Long COVID    Migraines    MS (multiple sclerosis) ([place])    Past Surgical History:  Procedure Laterality Date   BUNIONECTOMY Bilateral    COLONOSCOPY WITH PROPOFOL N/A 08/19/2019   Procedure: COLONOSCOPY WITH PROPOFOL;  Surgeon: Lin Landsman, MD;  Location: [facility] ENDOSCOPY;  Service: Gastroenterology;  Laterality: N/A;   ESOPHAGOGASTRODUODENOSCOPY (EGD) WITH PROPOFOL N/A 08/19/2019   Procedure: ESOPHAGOGASTRODUODENOSCOPY (EGD) WITH PROPOFOL;  Surgeon: Lin Landsman, MD;  Location: [facility] ENDOSCOPY;  Service: Gastroenterology;  Laterality: N/A;   EYE SURGERY     blood clot in eye   HERNIA REPAIR  1991, 2001   inguinal and umbilical   LAPAROSCOPIC GASTRIC SLEEVE RESECTION  2015   TOTAL MASTECTOMY Bilateral    Patient Active Problem List   Diagnosis Date Noted   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   Borderline personality disorder ([place]) 09/21/2020   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/2018   Dislocation of patellofemoral joint 10/13/2017   Labor and delivery indication for care or intervention 02/15/2017   Pregnancy 01/01/2017   Encounter for procreative genetic counseling  01/24/5207   Umbilical hernia without obstruction and without gangrene    Norovirus 06/24/2016   ADHD (attention deficit hyperactivity disorder) 06/20/2016   Anxiety 06/20/2016   Asthma without status asthmaticus 06/20/2016   Depression 06/20/2016   Fibrocystic breast disease 06/20/2016   Fibromyalgia 06/20/2016   Migraine 06/20/2016   Ovarian cyst 06/20/2016   Morbid obesity ([place]) 02/24/2014   Headache 07/26/2013   KNEE PAIN, BILATERAL 01/02/2010   TENDINITIS, PATELLAR 01/02/2010    ONSET DATE: April 2023  REFERRING DIAG:  G35 (ICD-10-CM) - Multiple sclerosis   THERAPY DIAG:  No diagnosis found.  Rationale for Evaluation and Treatment: Rehabilitation  SUBJECTIVE:  SUBJECTIVE STATEMENT: Pt reports feeling continued improvement in back pain and endurance but still present. Pt had some tremors in their knee musculature following their 6 minute walk test.   Pt accompanied by: self  PERTINENT HISTORY:   Pt with lengthy subjective history of multiple complications associated with this evaluation. Pt reports significant impairment 2/2 to long-covid diagnosed December 2022, particularly expressive aphasia.  Pt notes aphasia worse since RSV infection in early Dec 2023. In PT eval here, pt uses mobile phone app for expressive language, with improved use of verbal expressive language as anxiety decreases- pt reports significant and fluctuating anomia. Baseline pt speaks [place], [place], and ASL, sometimes is more easily able to express self via ASL. At eval pt using don joy knee brace RLE due to Hx PCL rupture and hx of patella dislocation. Pt diagnosed with MS.   PAIN: No pain today  PRECAUTIONS: None  WEIGHT BEARING RESTRICTIONS: No  FALLS: Has patient fallen in last 6 months? No; last fall in  April and broke L foot.  PLOF: Independent  PATIENT GOALS: Strengthening due to losing a lot of atrophy.  Pt had weight loss surgery 2x.  Pt states the MD is wanting pt to increase the stability of the R knee.  Pt wants to be able to walk without pain.  OBJECTIVE:   TODAY'S TREATMENT: DATE: 04/17/22  Manual Therapy: 15 min total in prone and R side lying    Prone Grade 3 PA thoracic mobs from T1-T7, 2x10 sec bouts/segment for improved thoracic and periscapular mobility   5 minutes STM in prone to L middle trap and rhomboids with twitch response and pt reporting concordant symptoms with palpation. They report symptoms improve during and following STM    There.ex:    Standing wall angels: x10, cues for proper form ant contact with the wall.     Standing T row: 12.5 lbs, 2x12. Mod multimodal cuing for form/technique. Excellent carryover after cuing.    Standing row: 12.5 lbs, 2x12. Mod multimodal cuing for form/technique. Excellent carryover after cuing.   Ambulation overground around EVS hallway and to medical arts downstairs waiting area, some s/s of instability in this hallway near end of walk. Will assess if this was due to windows/ environment or due to fatigue next session.        PATIENT EDUCATION: Education details: Pt educated throughout session about improved exercise technique, movement at target joints, use of target muscles after min to mod verbal, visual, tactile cues.  Person educated: Patient Education method: Special educational needs teacher, TC Education comprehension: verbalized understanding; Return demo  HOME EXERCISE PROGRAM:  Bilateral foot eversion Alphabet each LE March with df with YTB    GOALS:  Goals reviewed with patient? Yes  SHORT TERM GOALS: Target date: 03/21/2022  Pt will be independent with HEP in order to demonstrate increased ability to perform tasks related to occupation/hobbies. Baseline: 1/24: HEP compliant  Goal status:MET   LONG TERM  GOALS: Target date: 05/16/2022  1.  Patient will complete five times sit to stand test in < 10 seconds indicating an increased LE strength and improved balance. Baseline: 12.83 sec 1/24: 9 seconds Goal status: MET  2.  Patient will increase FOTO score to equal to or greater than 46 to demonstrate statistically significant improvement in mobility and quality of life.  Baseline: 36 1/24: 40% Goal status: Partially Met   3.  Patient will increase FGA score by > 4 points to demonstrate decreased fall risk during functional activities. Baseline:  Not performed at initial evaluation. 12/21: 20/30 1/24: 23% Goal status: Partially Met   4.  Patient will reduce timed up and go to <11 seconds to reduce fall risk and demonstrate improved transfer/gait ability. Baseline: Not performed at initial evaluation. 12/21: 6 seconds Goal status: MET  5.  Patient will increase 10 meter walk test to >1.61ms as to improve gait speed for better community ambulation and to reduce fall risk. Baseline: Not performed at initial evaluation. 1/24: 6.5 seconds =1.5 m/s Goal status: MET   6.  Patient will increase six minute walk test distance to >1800 for progression to community ambulator and improve gait ability Baseline: Not performed at initial evaluation. 12/1: 1405 ft 1/24: perform next session.; 04/08/22: 1500' Goal status: Partialy Met     ASSESSMENT:  CLINICAL IMPRESSION: Continued with current plan of care as laid out in evaluation and recent prior sessions. Pt remains motivated to advance progress toward goals in order to maximize independence and safety at home. Pt requires cuing for completion of exercises in order to provide adequate level of stimulation challenge while minimizing pain and discomfort when possible. Pt closely monitored throughout session pt response and to maximize patient safety during interventions. Pt continues to demonstrate progress toward goals AEB progression of interventions this  date either in volume or intensity.     OBJECTIVE IMPAIRMENTS:  Abnormal gait, decreased activity tolerance, decreased balance, decreased endurance, decreased knowledge of condition, decreased mobility, difficulty walking, decreased strength, dizziness, impaired sensation, and pain.   ACTIVITY LIMITATIONS: lifting, bending, sitting, standing, squatting, sleeping, stairs, and locomotion level  PARTICIPATION LIMITATIONS: cleaning, laundry, interpersonal relationship, community activity, and occupation  PERSONAL FACTORS: Age, Education, Past/current experiences, Time since onset of injury/illness/exacerbation, and 3+ comorbidities: MS, Depression, Fibromyalgia, Rupture of PCL  are also affecting patient's functional outcome.   REHAB POTENTIAL: Good  CLINICAL DECISION MAKING: Stable/uncomplicated  EVALUATION COMPLEXITY: High  PLAN:  PT FREQUENCY: 2x/week  PT DURATION: 12 weeks  PLANNED INTERVENTIONS: Therapeutic exercises, Therapeutic activity, Neuromuscular re-education, Balance training, Gait training, Patient/Family education, Self Care, Joint mobilization, Stair training, Vestibular training, Canalith repositioning, Dry Needling, Electrical stimulation, Spinal manipulation, Spinal mobilization, Cryotherapy, Moist heat, Traction, and Manual therapy  PLAN FOR NEXT SESSION: Continue PT POC    11:03 AM, 04/17/22 CParticia LatherPT ,DPT Physical Therapist- [facility]    04/17/22, 11:03 AM

## 2022-04-18 ENCOUNTER — Ambulatory Visit: Payer: BC Managed Care – PPO | Admitting: Physical Therapy

## 2022-04-22 ENCOUNTER — Ambulatory Visit: Payer: BC Managed Care – PPO | Admitting: Physical Therapy

## 2022-04-22 NOTE — Therapy (Deleted)
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[place])    Depression    Encephalomyelitis    Fibrocystic breast    Fibromyalgia    Long COVID    Migraines    MS (multiple sclerosis) ([place])    Past Surgical History:  Procedure Laterality Date   BUNIONECTOMY Bilateral    COLONOSCOPY WITH PROPOFOL N/A 08/19/2019   Procedure: COLONOSCOPY WITH PROPOFOL;  Surgeon: Lin Landsman, MD;  Location: [facility] ENDOSCOPY;  Service: Gastroenterology;  Laterality: N/A;   ESOPHAGOGASTRODUODENOSCOPY (EGD) WITH PROPOFOL N/A 08/19/2019   Procedure: ESOPHAGOGASTRODUODENOSCOPY (EGD) WITH PROPOFOL;  Surgeon: Lin Landsman, MD;  [facility] ENDOSCOPY;  Service: Gastroenterology;  Laterality: N/A;   EYE SURGERY     blood clot in eye   HERNIA REPAIR  1991, 2001   inguinal and umbilical   LAPAROSCOPIC GASTRIC SLEEVE RESECTION  2015   TOTAL MASTECTOMY Bilateral    Patient Active Problem List   Diagnosis Date Noted   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   Borderline personality disorder ([place]) 09/21/2020   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/2018   Dislocation of patellofemoral joint 10/13/2017   Labor and delivery indication for care or intervention 02/15/2017   Pregnancy 01/01/2017   Encounter for procreative genetic counseling [id number]   Umbilical hernia without obstruction and without gangrene    Norovirus 06/24/2016   ADHD (attention deficit hyperactivity disorder) 06/20/2016   Anxiety 06/20/2016   Asthma without status asthmaticus 06/20/2016   Depression 06/20/2016    Fibrocystic breast disease 06/20/2016   Fibromyalgia 06/20/2016   Migraine 06/20/2016   Ovarian cyst 06/20/2016   Morbid obesity ([place]) 02/24/2014   Headache 07/26/2013   KNEE PAIN, BILATERAL 01/02/2010   TENDINITIS, PATELLAR 01/02/2010    ONSET DATE: April 2023  REFERRING DIAG:  G35 (ICD-10-CM) - Multiple sclerosis   THERAPY DIAG:  No diagnosis found.  Rationale for Evaluation and Treatment: Rehabilitation  SUBJECTIVE:                                                                                                                                                                                             SUBJECTIVE STATEMENT: Pt reports feeling continued improvement in  back pain and endurance but still present. Pt had some tremors in their knee musculature following their 6 minute walk test.   Pt accompanied by: self  PERTINENT HISTORY:   Pt with lengthy subjective history of multiple complications associated with this evaluation. Pt reports significant impairment 2/2 to long-covid diagnosed December 2022, particularly expressive aphasia.  Pt notes aphasia worse since RSV infection in early Dec 2023. In PT eval here, pt uses mobile phone app for expressive language, with improved use of verbal expressive language as anxiety decreases- pt reports significant and fluctuating anomia. Baseline pt speaks [place], [place], and ASL, sometimes is more easily able to express self via ASL. At eval pt using don joy knee brace RLE due to Hx PCL rupture and hx of patella dislocation. Pt diagnosed with MS.   PAIN: No pain today  PRECAUTIONS: None  WEIGHT BEARING RESTRICTIONS: No  FALLS: Has patient fallen in last 6 months? No; last fall in April and broke L foot.  PLOF: Independent  PATIENT GOALS: Strengthening due to losing a lot of atrophy.  Pt had weight loss surgery 2x.  Pt states the MD is wanting pt to increase the stability of the R knee.  Pt wants to be able to walk without  pain.  OBJECTIVE:   TODAY'S TREATMENT: DATE: 04/22/22  Manual Therapy: 15 min total in prone and R side lying    Prone Grade 3 PA thoracic mobs from T1-T7, 2x10 sec bouts/segment for improved thoracic and periscapular mobility   5 minutes STM in prone to L middle trap and rhomboids with twitch response and pt reporting concordant symptoms with palpation. They report symptoms improve during and following STM    There.ex:    Standing wall angels: x10, cues for proper form ant contact with the wall.     Standing T row: 12.5 lbs, 2x12. Mod multimodal cuing for form/technique. Excellent carryover after cuing.    Standing row: 12.5 lbs, 2x12. Mod multimodal cuing for form/technique. Excellent carryover after cuing.   Ambulation overground around EVS hallway and to medical arts downstairs waiting area, some s/s of instability in this hallway near end of walk. Will assess if this was due to windows/ environment or due to fatigue next session.        PATIENT EDUCATION: Education details: Pt educated throughout session about improved exercise technique, movement at target joints, use of target muscles after min to mod verbal, visual, tactile cues.  Person educated: Patient Education method: Special educational needs teacher, TC Education comprehension: verbalized understanding; Return demo  HOME EXERCISE PROGRAM:  Bilateral foot eversion Alphabet each LE March with df with YTB    GOALS:  Goals reviewed with patient? Yes  SHORT TERM GOALS: Target date: 03/21/2022  Pt will be independent with HEP in order to demonstrate increased ability to perform tasks related to occupation/hobbies. Baseline: 1/24: HEP compliant  Goal status:MET   LONG TERM GOALS: Target date: 05/16/2022  1.  Patient will complete five times sit to stand test in < 10 seconds indicating an increased LE strength and improved balance. Baseline: 12.83 sec 1/24: 9 seconds Goal status: MET  2.  Patient will increase FOTO score to  equal to or greater than 46 to demonstrate statistically significant improvement in mobility and quality of life.  Baseline: 36 1/24: 40% Goal status: Partially Met   3.  Patient will increase FGA score by > 4 points to demonstrate decreased fall risk during functional activities. Baseline: Not performed at initial evaluation. 12/21: 20/30 1/24:  23% Goal status: Partially Met   4.  Patient will reduce timed up and go to <11 seconds to reduce fall risk and demonstrate improved transfer/gait ability. Baseline: Not performed at initial evaluation. 12/21: 6 seconds Goal status: MET  5.  Patient will increase 10 meter walk test to >1.34ms as to improve gait speed for better community ambulation and to reduce fall risk. Baseline: Not performed at initial evaluation. 1/24: 6.5 seconds =1.5 m/s Goal status: MET   6.  Patient will increase six minute walk test distance to >1800 for progression to community ambulator and improve gait ability Baseline: Not performed at initial evaluation. 12/1: 1405 ft 1/24: perform next session.; 04/08/22: 1500' Goal status: Partialy Met     ASSESSMENT:  CLINICAL IMPRESSION: Continued with current plan of care as laid out in evaluation and recent prior sessions. Pt remains motivated to advance progress toward goals in order to maximize independence and safety at home. Pt requires cuing for completion of exercises in order to provide adequate level of stimulation challenge while minimizing pain and discomfort when possible. Pt closely monitored throughout session pt response and to maximize patient safety during interventions. Pt continues to demonstrate progress toward goals AEB progression of interventions this date either in volume or intensity.     OBJECTIVE IMPAIRMENTS:  Abnormal gait, decreased activity tolerance, decreased balance, decreased endurance, decreased knowledge of condition, decreased mobility, difficulty walking, decreased strength, dizziness,  impaired sensation, and pain.   ACTIVITY LIMITATIONS: lifting, bending, sitting, standing, squatting, sleeping, stairs, and locomotion level  PARTICIPATION LIMITATIONS: cleaning, laundry, interpersonal relationship, community activity, and occupation  PERSONAL FACTORS: Age, Education, Past/current experiences, Time since onset of injury/illness/exacerbation, and 3+ comorbidities: MS, Depression, Fibromyalgia, Rupture of PCL  are also affecting patient's functional outcome.   REHAB POTENTIAL: Good  CLINICAL DECISION MAKING: Stable/uncomplicated  EVALUATION COMPLEXITY: High  PLAN:  PT FREQUENCY: 2x/week  PT DURATION: 12 weeks  PLANNED INTERVENTIONS: Therapeutic exercises, Therapeutic activity, Neuromuscular re-education, Balance training, Gait training, Patient/Family education, Self Care, Joint mobilization, Stair training, Vestibular training, Canalith repositioning, Dry Needling, Electrical stimulation, Spinal manipulation, Spinal mobilization, Cryotherapy, Moist heat, Traction, and Manual therapy  PLAN FOR NEXT SESSION: Continue PT POC    8:22 AM, 04/22/22 CParticia LatherPT ,DPT Physical Therapist- [facility]    04/22/22, 8:22 AM

## 2022-04-25 ENCOUNTER — Ambulatory Visit: Payer: BC Managed Care – PPO | Admitting: Physical Therapy

## 2022-04-29 ENCOUNTER — Ambulatory Visit: Payer: BC Managed Care – PPO

## 2022-04-29 DIAGNOSIS — R29898 Other symptoms and signs involving the musculoskeletal system: Secondary | ICD-10-CM

## 2022-04-29 DIAGNOSIS — M6281 Muscle weakness (generalized): Secondary | ICD-10-CM

## 2022-04-29 DIAGNOSIS — U099 Post covid-19 condition, unspecified: Secondary | ICD-10-CM

## 2022-04-29 DIAGNOSIS — G35 Multiple sclerosis: Secondary | ICD-10-CM

## 2022-04-29 DIAGNOSIS — R278 Other lack of coordination: Secondary | ICD-10-CM

## 2022-04-29 NOTE — Therapy (Signed)
OUTPATIENT PHYSICAL THERAPY NEURO TREATMENT    Patient Name: [name] MRN: [id number] DOB:1980/07/11, 42 y.o., adult Today's Date: 04/29/2022   PCP: Paulita Cradle, MD REFERRING PROVIDER: Paulita Cradle, MD  END OF SESSION:   PT End of Session - 04/29/22 1131     Visit Number 14    Number of Visits 24    Date for PT Re-Evaluation 05/16/22    Authorization Type [place] [employer]    Authorization Time Period 02/21/22-05/16/22    PT Start Time 1110    PT Stop Time 1145    PT Time Calculation (min) 35 min    Activity Tolerance Patient tolerated treatment well;No increased pain    Behavior During Therapy WFL for tasks assessed/performed              Past Medical History:  Diagnosis Date   Asthma    Breast cyst    Breast mass    Chronic fatigue syndrome    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HCC)    Depression    Encephalomyelitis    Fibrocystic breast    Fibromyalgia    Long COVID    Migraines    MS (multiple sclerosis) ([place])    Past Surgical History:  Procedure Laterality Date   BUNIONECTOMY Bilateral    COLONOSCOPY WITH PROPOFOL N/A 08/19/2019   Procedure: COLONOSCOPY WITH PROPOFOL;  Surgeon: Lin Landsman, MD;  Location: [facility] ENDOSCOPY;  Service: Gastroenterology;  Laterality: N/A;   ESOPHAGOGASTRODUODENOSCOPY (EGD) WITH PROPOFOL N/A 08/19/2019   Procedure: ESOPHAGOGASTRODUODENOSCOPY (EGD) WITH PROPOFOL;  Surgeon: Lin Landsman, MD;  Location: [facility] ENDOSCOPY;  Service: Gastroenterology;  Laterality: N/A;   EYE SURGERY     blood clot in eye   HERNIA REPAIR  1991, 2001   inguinal and umbilical   LAPAROSCOPIC GASTRIC SLEEVE RESECTION  2015   TOTAL MASTECTOMY Bilateral    Patient Active Problem List   Diagnosis Date Noted   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   Borderline personality disorder ([place]) 09/21/2020   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/2018    Dislocation of patellofemoral joint 10/13/2017   Labor and delivery indication for care or intervention 02/15/2017   Pregnancy 01/01/2017   Encounter for procreative genetic counseling 99991111   Umbilical hernia without obstruction and without gangrene    Norovirus 06/24/2016   ADHD (attention deficit hyperactivity disorder) 06/20/2016   Anxiety 06/20/2016   Asthma without status asthmaticus 06/20/2016   Depression 06/20/2016   Fibrocystic breast disease 06/20/2016   Fibromyalgia 06/20/2016   Migraine 06/20/2016   Ovarian cyst 06/20/2016   Morbid obesity ([place]) 02/24/2014   Headache 07/26/2013   KNEE PAIN, BILATERAL 01/02/2010   TENDINITIS, PATELLAR 01/02/2010    ONSET DATE: April 2023  REFERRING DIAG:  G35 (ICD-10-CM) - Multiple sclerosis   THERAPY DIAG:  Weakness of right lower extremity  Muscle weakness (generalized)  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  Multiple sclerosis (HCC)  Other lack of coordination  Rationale for Evaluation and Treatment: Rehabilitation  SUBJECTIVE:  SUBJECTIVE STATEMENT: Rt wrist flared up, sore. Pt reports continued fatigue since last week resultant from cold.   Pt accompanied by: self  PERTINENT HISTORY:   Pt reports multifactorial medical history contributing to deficits and impairment. Pt reports significant impairment 2/2 to long-covid diagnosed December 2022, particularly expressive aphasia.  Pt notes aphasia worse since RSV infection in early Dec 2023. In PT eval here, pt uses mobile phone app for expressive language, with improved use of verbal expressive language as anxiety decreases- pt reports significant and fluctuating anomia. Baseline pt speaks [place], [place], and ASL, sometimes is more easily able to express self via ASL. At  eval pt using don joy knee brace RLE due to Hx PCL rupture and hx of patella dislocation. Pt diagnosed with MS.   PAIN: Right wrist and right wrist extensors, does not rate  PRECAUTIONS: None  WEIGHT BEARING RESTRICTIONS: No  FALLS: Has patient fallen in last 6 months? Rt dorsal hand pain flared up, visible knots in Left wrist extensors  PLOF: Independent  PATIENT GOALS: Strengthening due to losing a lot of atrophy.  Pt had weight loss surgery 2x.  Pt states the MD is wanting pt to increase the stability of the R knee.  Pt wants to be able to walk without pain.  OBJECTIVE:   Intervention This Date: 04/29/22 -supine over yoga mat roll x3 minutes, then 20x wand flexion -supine over yoga mat roll, lower level spine in T pec stretch pose x3 minutes  -Prone on 2 pillows, thoracic extension + Ws (scapular depression/retraction) 10x3secH  -Hooklying SAQ 1x15 @ 7.5lb AW x 3secH      PATIENT EDUCATION: Education details: Pt educated throughout session about improved exercise technique, movement at target joints, use of target muscles after min to mod verbal, visual, tactile cues.  Person educated: Patient Education method: Special educational needs teacher, TC Education comprehension: verbalized understanding; Return demo  HOME EXERCISE PROGRAM:  Bilateral foot eversion Alphabet each LE March with df with YTB    GOALS:  Goals reviewed with patient? Yes  SHORT TERM GOALS: Target date: 03/21/2022  Pt will be independent with HEP in order to demonstrate increased ability to perform tasks related to occupation/hobbies. Baseline: 1/24: HEP compliant  Goal status:MET   LONG TERM GOALS: Target date: 05/16/2022  1.  Patient will complete five times sit to stand test in < 10 seconds indicating an increased LE strength and improved balance. Baseline: 12.83 sec 1/24: 9 seconds Goal status: MET  2.  Patient will increase FOTO score to equal to or greater than 46 to demonstrate statistically significant  improvement in mobility and quality of life.  Baseline: 36 1/24: 40% Goal status: Partially Met   3.  Patient will increase FGA score by > 4 points to demonstrate decreased fall risk during functional activities. Baseline: Not performed at initial evaluation. 12/21: 20/30 1/24: 23% Goal status: Partially Met   4.  Patient will reduce timed up and go to <11 seconds to reduce fall risk and demonstrate improved transfer/gait ability. Baseline: Not performed at initial evaluation. 12/21: 6 seconds Goal status: MET  5.  Patient will increase 10 meter walk test to >1.80ms as to improve gait speed for better community ambulation and to reduce fall risk. Baseline: Not performed at initial evaluation. 1/24: 6.5 seconds =1.5 m/s Goal status: MET   6.  Patient will increase six minute walk test distance to >1800 for progression to community ambulator and improve gait ability Baseline: Not performed at initial evaluation. 12/1: [id number]  ft 1/24: perform next session.; 04/08/22: 1500' Goal status: Partialy Met     ASSESSMENT:  CLINICAL IMPRESSION: Taking is slow today due to continued fatigue issues recently flared by illness last week. Session with late start as pt had a family emergency that warranted a phone call to school upon arrival. Still targeting improvements in thoracolumbar hypomobility, both active and passive. Pt showing improvements in ROM, able to progress work on available strength for A/ROM. Pt tolerates session well in general without exacerbation of symptoms. Pt continues to demonstrate progress toward goals AEB progression of interventions this date either in volume or intensity.     OBJECTIVE IMPAIRMENTS:  Abnormal gait, decreased activity tolerance, decreased balance, decreased endurance, decreased knowledge of condition, decreased mobility, difficulty walking, decreased strength, dizziness, impaired sensation, and pain.   ACTIVITY LIMITATIONS: lifting, bending, sitting, standing,  squatting, sleeping, stairs, and locomotion level  PARTICIPATION LIMITATIONS: cleaning, laundry, interpersonal relationship, community activity, and occupation  PERSONAL FACTORS: Age, Education, Past/current experiences, Time since onset of injury/illness/exacerbation, and 3+ comorbidities: MS, Depression, Fibromyalgia, Rupture of PCL  are also affecting patient's functional outcome.   REHAB POTENTIAL: Good  CLINICAL DECISION MAKING: Stable/uncomplicated  EVALUATION COMPLEXITY: High  PLAN:  PT FREQUENCY: 2x/week  PT DURATION: 12 weeks  PLANNED INTERVENTIONS: Therapeutic exercises, Therapeutic activity, Neuromuscular re-education, Balance training, Gait training, Patient/Family education, Self Care, Joint mobilization, Stair training, Vestibular training, Canalith repositioning, Dry Needling, Electrical stimulation, Spinal manipulation, Spinal mobilization, Cryotherapy, Moist heat, Traction, and Manual therapy  PLAN FOR NEXT SESSION: Continue PT POC    11:41 AM, 04/29/22 Etta Grandchild PT ,DPT Physical Therapist- [facility]     04/29/22, 11:41 AM

## 2022-05-03 ENCOUNTER — Telehealth: Payer: Self-pay

## 2022-05-03 ENCOUNTER — Ambulatory Visit: Payer: BC Managed Care – PPO

## 2022-05-03 NOTE — Telephone Encounter (Signed)
Pt did not arrive for appointment 05/03/22 as scheduled. Author reached pt via telephone, pt reports unaware of appointment, apologizes for absence. Author able to assist pt with reschedule of some appointments follow week to accommodate other medical appointments/obligations. Pt aware of next appointment date/time.   10:26 AM, 05/03/22 Etta Grandchild, PT, DPT Physical Therapist - [facility] [phone]

## 2022-05-06 ENCOUNTER — Ambulatory Visit: Payer: BC Managed Care – PPO | Admitting: Physical Therapy

## 2022-05-08 ENCOUNTER — Ambulatory Visit: Payer: BC Managed Care – PPO | Admitting: Physical Therapy

## 2022-05-08 ENCOUNTER — Encounter: Payer: Self-pay | Admitting: Physical Therapy

## 2022-05-08 DIAGNOSIS — R29898 Other symptoms and signs involving the musculoskeletal system: Secondary | ICD-10-CM

## 2022-05-08 DIAGNOSIS — M6281 Muscle weakness (generalized): Secondary | ICD-10-CM

## 2022-05-08 DIAGNOSIS — G35 Multiple sclerosis: Secondary | ICD-10-CM

## 2022-05-08 DIAGNOSIS — R278 Other lack of coordination: Secondary | ICD-10-CM

## 2022-05-08 NOTE — Therapy (Addendum)
OUTPATIENT PHYSICAL THERAPY NEURO TREATMENT    Patient Name: [name] MRN: [id number] DOB:[birth year], 42 y.o., adult Today's Date: 05/08/2022   PCP: Paulita Cradle, MD REFERRING PROVIDER: Paulita Cradle, MD  END OF SESSION:   PT End of Session - 05/08/22 1212     Visit Number 15    Number of Visits 24    Date for PT Re-Evaluation 05/16/22    Authorization Type [place] [employer]    Authorization Time Period 02/21/22-05/16/22    PT Start Time 1104    PT Stop Time 1144    PT Time Calculation (min) 40 min    Activity Tolerance Patient tolerated treatment well;No increased pain    Behavior During Therapy WFL for tasks assessed/performed               Past Medical History:  Diagnosis Date   Asthma    Breast cyst    Breast mass    Chronic fatigue syndrome    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HCC)    Depression    Encephalomyelitis    Fibrocystic breast    Fibromyalgia    Long COVID    Migraines    MS (multiple sclerosis) ([place])    Past Surgical History:  Procedure Laterality Date   BUNIONECTOMY Bilateral    COLONOSCOPY WITH PROPOFOL N/A 08/19/2019   Procedure: COLONOSCOPY WITH PROPOFOL;  Surgeon: Lin Landsman, MD;  Location: [facility] ENDOSCOPY;  Service: Gastroenterology;  Laterality: N/A;   ESOPHAGOGASTRODUODENOSCOPY (EGD) WITH PROPOFOL N/A 08/19/2019   Procedure: ESOPHAGOGASTRODUODENOSCOPY (EGD) WITH PROPOFOL;  Surgeon: Lin Landsman, MD;  Location: [facility] ENDOSCOPY;  Service: Gastroenterology;  Laterality: N/A;   EYE SURGERY     blood clot in eye   HERNIA REPAIR  1991, 2001   inguinal and umbilical   LAPAROSCOPIC GASTRIC SLEEVE RESECTION  2015   TOTAL MASTECTOMY Bilateral    Patient Active Problem List   Diagnosis Date Noted   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   Borderline personality disorder ([place]) 09/21/2020   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/2018    Dislocation of patellofemoral joint 10/13/2017   Labor and delivery indication for care or intervention 02/15/2017   Pregnancy 01/01/2017   Encounter for procreative genetic counseling [id number]   Umbilical hernia without obstruction and without gangrene    Norovirus 06/24/2016   ADHD (attention deficit hyperactivity disorder) 06/20/2016   Anxiety 06/20/2016   Asthma without status asthmaticus 06/20/2016   Depression 06/20/2016   Fibrocystic breast disease 06/20/2016   Fibromyalgia 06/20/2016   Migraine 06/20/2016   Ovarian cyst 06/20/2016   Morbid obesity ([place]) 02/24/2014   Headache 07/26/2013   KNEE PAIN, BILATERAL 01/02/2010   TENDINITIS, PATELLAR 01/02/2010    ONSET DATE: April 2023  REFERRING DIAG:  G35 (ICD-10-CM) - Multiple sclerosis   THERAPY DIAG:  Weakness of right lower extremity  Muscle weakness (generalized)  Multiple sclerosis (HCC)  Other lack of coordination  Rationale for Evaluation and Treatment: Rehabilitation  SUBJECTIVE:  SUBJECTIVE STATEMENT: Patient reports they are still experiencing increased soreness in the dorsal aspect of their wrist.  They believe it could be tarsal tunnel and nerve entrapment related.  Patient also experiencing increased instability on the right knee this date and is manifesting in impaired gait mechanics with ambulation this session.  Pt accompanied by: self  PERTINENT HISTORY:   Pt reports multifactorial medical history contributing to deficits and impairment. Pt reports significant impairment 2/2 to long-covid diagnosed December 2022, particularly expressive aphasia.  Pt notes aphasia worse since RSV infection in early Dec 2023. In PT eval here, pt uses mobile phone app for expressive language, with improved use of verbal expressive  language as anxiety decreases- pt reports significant and fluctuating anomia. Baseline pt speaks [place], [place], and ASL, sometimes is more easily able to express self via ASL. At eval pt using don joy knee brace RLE due to Hx PCL rupture and hx of patella dislocation. Pt diagnosed with MS.   PAIN: Right wrist and right wrist extensors, does not rate  PRECAUTIONS: None  WEIGHT BEARING RESTRICTIONS: No  FALLS: Has patient fallen in last 6 months? Rt dorsal hand pain flared up, visible knots in Left wrist extensors  PLOF: Independent  PATIENT GOALS: Strengthening due to losing a lot of atrophy.  Pt had weight loss surgery 2x.  Pt states the MD is wanting pt to increase the stability of the R knee.  Pt wants to be able to walk without pain.  OBJECTIVE:   Intervention This Date: 05/08/22 Ambulation overground around EVS hallway noticeable NBOS and R toe inward pointing throughout.    -supine on treatment table, 20x wand flexion -Prone on 2 pillows, thoracic extension + Ws (scapular depression/retraction) 10x3secH  -Hooklying SAQ 1x15 @ 7.5lb [place] L and 5# on R x 3secH -Unable to complete 7.5 on the right secondary to weakness.  This is noticeable decrease in strength from previous session.  Standing row: 12.5 lbs, 2x12. Mod multimodal cuing for form/technique. Excellent carryover after cuing.    Manual:  Ischemic TP release to L rhomboid musculature.   PATIENT EDUCATION: Education details: Pt educated throughout session about improved exercise technique, movement at target joints, use of target muscles after min to mod verbal, visual, tactile cues.  Person educated: Patient Education method: Special educational needs teacher, TC Education comprehension: verbalized understanding; Return demo  HOME EXERCISE PROGRAM:  Bilateral foot eversion Alphabet each LE March with df with YTB    GOALS:  Goals reviewed with patient? Yes  SHORT TERM GOALS: Target date: 03/21/2022  Pt will be independent  with HEP in order to demonstrate increased ability to perform tasks related to occupation/hobbies. Baseline: 1/24: HEP compliant  Goal status:MET   LONG TERM GOALS: Target date: 05/16/2022  1.  Patient will complete five times sit to stand test in < 10 seconds indicating an increased LE strength and improved balance. Baseline: 12.83 sec 1/24: 9 seconds Goal status: MET  2.  Patient will increase FOTO score to equal to or greater than 46 to demonstrate statistically significant improvement in mobility and quality of life.  Baseline: 36 1/24: 40% Goal status: Partially Met   3.  Patient will increase FGA score by > 4 points to demonstrate decreased fall risk during functional activities. Baseline: Not performed at initial evaluation. 12/21: 20/30 1/24: 23% Goal status: Partially Met   4.  Patient will reduce timed up and go to <11 seconds to reduce fall risk and demonstrate improved transfer/gait ability. Baseline: Not performed at  initial evaluation. 12/21: 6 seconds Goal status: MET  5.  Patient will increase 10 meter walk test to >1.43ms as to improve gait speed for better community ambulation and to reduce fall risk. Baseline: Not performed at initial evaluation. 1/24: 6.5 seconds =1.5 m/s Goal status: MET   6.  Patient will increase six minute walk test distance to >1800 for progression to community ambulator and improve gait ability Baseline: Not performed at initial evaluation. 12/1: 1405 ft 1/24: perform next session.; 04/08/22: 1500' Goal status: Partialy Met     ASSESSMENT:  CLINICAL IMPRESSION: Patient presents with good motivation for completion of physical therapy activities.  Patient has increased reports of unsteadiness on the right lower extremity which is concordant with increased weakness on the side as evidenced by inability to complete similar exercises to last session at the same resistance.  Patient also demonstrating impaired gait mechanics in the right lower  extremity as well.  In addition to this patient experiencing right and left dorsal hand paresthesia in accordance with grip strength limitations.  Will look into ventral causes for this next session including but not limited to cubital tunnel syndrome.  Pt will continue to benefit from skilled physical therapy intervention to address impairments, improve QOL, and attain therapy goals. Pt continues to demonstrate progress toward goals AEB progression of interventions this date either in volume or intensity.     OBJECTIVE IMPAIRMENTS:  Abnormal gait, decreased activity tolerance, decreased balance, decreased endurance, decreased knowledge of condition, decreased mobility, difficulty walking, decreased strength, dizziness, impaired sensation, and pain.   ACTIVITY LIMITATIONS: lifting, bending, sitting, standing, squatting, sleeping, stairs, and locomotion level  PARTICIPATION LIMITATIONS: cleaning, laundry, interpersonal relationship, community activity, and occupation  PERSONAL FACTORS: Age, Education, Past/current experiences, Time since onset of injury/illness/exacerbation, and 3+ comorbidities: MS, Depression, Fibromyalgia, Rupture of PCL  are also affecting patient's functional outcome.   REHAB POTENTIAL: Good  CLINICAL DECISION MAKING: Stable/uncomplicated  EVALUATION COMPLEXITY: High  PLAN:  PT FREQUENCY: 2x/week  PT DURATION: 12 weeks  PLANNED INTERVENTIONS: Therapeutic exercises, Therapeutic activity, Neuromuscular re-education, Balance training, Gait training, Patient/Family education, Self Care, Joint mobilization, Stair training, Vestibular training, Canalith repositioning, Dry Needling, Electrical stimulation, Spinal manipulation, Spinal mobilization, Cryotherapy, Moist heat, Traction, and Manual therapy  PLAN FOR NEXT SESSION: Continue PT POC, look at potential radial nerve entrapment     3:18 PM, 05/08/22 CParticia LatherPT ,DPT [facility] Therapist- [facility]    05/08/22, 3:18 PM

## 2022-05-10 ENCOUNTER — Ambulatory Visit: Payer: Medicaid Other | Attending: Physician Assistant

## 2022-05-10 DIAGNOSIS — F419 Anxiety disorder, unspecified: Secondary | ICD-10-CM | POA: Insufficient documentation

## 2022-05-10 DIAGNOSIS — R29898 Other symptoms and signs involving the musculoskeletal system: Secondary | ICD-10-CM | POA: Insufficient documentation

## 2022-05-10 DIAGNOSIS — U099 Post covid-19 condition, unspecified: Secondary | ICD-10-CM | POA: Diagnosis present

## 2022-05-10 DIAGNOSIS — R278 Other lack of coordination: Secondary | ICD-10-CM | POA: Insufficient documentation

## 2022-05-10 DIAGNOSIS — M6281 Muscle weakness (generalized): Secondary | ICD-10-CM | POA: Diagnosis present

## 2022-05-10 DIAGNOSIS — G35 Multiple sclerosis: Secondary | ICD-10-CM | POA: Insufficient documentation

## 2022-05-10 NOTE — Therapy (Signed)
OUTPATIENT PHYSICAL THERAPY NEURO TREATMENT    Patient Name: [name] MRN: [id number] DOB:June 22, 1980, 42 y.o., adult Today's Date: 05/10/2022   PCP: Paulita Cradle, MD REFERRING PROVIDER: Paulita Cradle, MD  END OF SESSION:   PT End of Session - 05/10/22 1110     Visit Number 16    Number of Visits 24    Date for PT Re-Evaluation 05/16/22    Authorization Type [employer]    Authorization Time Period 02/21/22-05/16/22    PT Start Time 0856    PT Stop Time 0930    PT Time Calculation (min) 34 min    Activity Tolerance Patient tolerated treatment well;No increased pain    Behavior During Therapy WFL for tasks assessed/performed                Past Medical History:  Diagnosis Date   Asthma    Breast cyst    Breast mass    Chronic fatigue syndrome    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HCC)    Depression    Encephalomyelitis    Fibrocystic breast    Fibromyalgia    Long COVID    Migraines    MS (multiple sclerosis) ([place])    Past Surgical History:  Procedure Laterality Date   BUNIONECTOMY Bilateral    COLONOSCOPY WITH PROPOFOL N/A 08/19/2019   Procedure: COLONOSCOPY WITH PROPOFOL;  Surgeon: Lin Landsman, MD;  Location: [facility] ENDOSCOPY;  Service: Gastroenterology;  Laterality: N/A;   ESOPHAGOGASTRODUODENOSCOPY (EGD) WITH PROPOFOL N/A 08/19/2019   Procedure: ESOPHAGOGASTRODUODENOSCOPY (EGD) WITH PROPOFOL;  Surgeon: Lin Landsman, MD;  Location: [facility] ENDOSCOPY;  Service: Gastroenterology;  Laterality: N/A;   EYE SURGERY     blood clot in eye   HERNIA REPAIR  1991, 2001   inguinal and umbilical   LAPAROSCOPIC GASTRIC SLEEVE RESECTION  2015   TOTAL MASTECTOMY Bilateral    Patient Active Problem List   Diagnosis Date Noted   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   Borderline personality disorder ([place]) 09/21/2020   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/2018    Dislocation of patellofemoral joint 10/13/2017   Labor and delivery indication for care or intervention 02/15/2017   Pregnancy 01/01/2017   Encounter for procreative genetic counseling 99991111   Umbilical hernia without obstruction and without gangrene    Norovirus 06/24/2016   ADHD (attention deficit hyperactivity disorder) 06/20/2016   Anxiety 06/20/2016   Asthma without status asthmaticus 06/20/2016   Depression 06/20/2016   Fibrocystic breast disease 06/20/2016   Fibromyalgia 06/20/2016   Migraine 06/20/2016   Ovarian cyst 06/20/2016   Morbid obesity ([place]) 02/24/2014   Headache 07/26/2013   KNEE PAIN, BILATERAL 01/02/2010   TENDINITIS, PATELLAR 01/02/2010    ONSET DATE: April 2023  REFERRING DIAG:  G35 (ICD-10-CM) - Multiple sclerosis   THERAPY DIAG:  Weakness of right lower extremity  Muscle weakness (generalized)  Multiple sclerosis ([place])  Rationale for Evaluation and Treatment: Rehabilitation  SUBJECTIVE:   SUBJECTIVE STATEMENT:  Pt reports they are having some difficulty with their arms.  Pt notes that they have been having difficulty with gripping specifically and has been researching tarsal tunnel believing it to be the cause.   Pt accompanied by: self  PERTINENT HISTORY:   Pt reports multifactorial medical history contributing to deficits and impairment. Pt reports significant impairment 2/2 to long-covid diagnosed December 2022, particularly expressive aphasia.  Pt notes aphasia worse since RSV infection in early Dec 2023.  In PT eval here, pt uses mobile phone app for expressive language, with improved use of verbal expressive language as anxiety decreases- pt reports significant and fluctuating anomia. Baseline pt speaks [place], [place], and ASL, sometimes is more easily able to express self via ASL. At eval pt using don joy knee brace RLE due to Hx PCL rupture and hx of patella dislocation. Pt diagnosed with MS.   PAIN: Right wrist and right wrist  extensors, does not rate  PRECAUTIONS: None  WEIGHT BEARING RESTRICTIONS: No  FALLS: Has patient fallen in last 6 months? Rt dorsal hand pain flared up, visible knots in Left wrist extensors  PLOF: Independent  PATIENT GOALS: Strengthening due to losing a lot of atrophy.  Pt had weight loss surgery 2x.  Pt states the MD is wanting pt to increase the stability of the R knee.  Pt wants to be able to walk without pain.  OBJECTIVE:    Intervention This Date: 05/10/22   UPPER EXTREMITY MMT:  MMT Right eval Left eval  Grip strength 86 83  (Blank rows = not tested)    Manual:   STM with TP release to supinator and extensor bundle for pain modulation and improved ROM Seated wrist extensor stretch with overpressure applied by therapist, 30 sec bouts    Trigger Point Dry Needling (TDN), unbilled Education performed with patient regarding potential benefit of TDN. Reviewed precautions and risks with patient. Reviewed special precautions/risks over lung fields which include pneumothorax. Reviewed signs and symptoms of pneumothorax and advised pt to go to ER immediately if these symptoms develop advise them of dry needling treatment. Extensive time spent with pt to ensure full understanding of TDN risks. Pt provided verbal consent to treatment. TDN performed to supinator and extensor bundle for pain modulation with 0.25 x 40 single needle placements with local twitch response (LTR). Pistoning technique utilized. Improved pain-free motion following intervention.      PATIENT EDUCATION: Education details: Pt educated throughout session about improved exercise technique, movement at target joints, use of target muscles after min to mod verbal, visual, tactile cues.  Person educated: Patient Education method: Special educational needs teacher, TC Education comprehension: verbalized understanding; Return demo  HOME EXERCISE PROGRAM:  Bilateral foot eversion Alphabet each LE March with df with YTB     GOALS:  Goals reviewed with patient? Yes  SHORT TERM GOALS: Target date: 03/21/2022  Pt will be independent with HEP in order to demonstrate increased ability to perform tasks related to occupation/hobbies. Baseline: 1/24: HEP compliant  Goal status:MET   LONG TERM GOALS: Target date: 05/16/2022  1.  Patient will complete five times sit to stand test in < 10 seconds indicating an increased LE strength and improved balance. Baseline: 12.83 sec 1/24: 9 seconds Goal status: MET  2.  Patient will increase FOTO score to equal to or greater than 46 to demonstrate statistically significant improvement in mobility and quality of life.  Baseline: 36 1/24: 40% Goal status: Partially Met   3.  Patient will increase FGA score by > 4 points to demonstrate decreased fall risk during functional activities. Baseline: Not performed at initial evaluation. 12/21: 20/30 1/24: 23% Goal status: Partially Met   4.  Patient will reduce timed up and go to <11 seconds to reduce fall risk and demonstrate improved transfer/gait ability. Baseline: Not performed at initial evaluation. 12/21: 6 seconds Goal status: MET  5.  Patient will increase 10 meter walk test to >1.51ms as to improve gait speed for better community  ambulation and to reduce fall risk. Baseline: Not performed at initial evaluation. 1/24: 6.5 seconds =1.5 m/s Goal status: MET   6.  Patient will increase six minute walk test distance to >1800 for progression to community ambulator and improve gait ability Baseline: Not performed at initial evaluation. 12/1: 1405 ft 1/24: perform next session.; 04/08/22: 1500' Goal status: PARTIALLY MET    ASSESSMENT:  CLINICAL IMPRESSION:  Pt treatment session limited due to late arrival by pt.  Pt noted they R elbow/forearm pain being the primary concern at this point in time.  Pt responded well to the manual therapy and agreeable to participating in the dry needling of the forearm.  Pt noted a  reduction in pain and an improvement in their overall ROM.  Pt stated that they are able to fully extend the elbow at this time and feels much better in the elbow.   Pt will continue to benefit from skilled therapy to address remaining deficits in order to improve overall QoL and return to PLOF.        OBJECTIVE IMPAIRMENTS:  Abnormal gait, decreased activity tolerance, decreased balance, decreased endurance, decreased knowledge of condition, decreased mobility, difficulty walking, decreased strength, dizziness, impaired sensation, and pain.   ACTIVITY LIMITATIONS: lifting, bending, sitting, standing, squatting, sleeping, stairs, and locomotion level  PARTICIPATION LIMITATIONS: cleaning, laundry, interpersonal relationship, community activity, and occupation  PERSONAL FACTORS: Age, Education, Past/current experiences, Time since onset of injury/illness/exacerbation, and 3+ comorbidities: MS, Depression, Fibromyalgia, Rupture of PCL  are also affecting patient's functional outcome.   REHAB POTENTIAL: Good  CLINICAL DECISION MAKING: Stable/uncomplicated  EVALUATION COMPLEXITY: High  PLAN:  PT FREQUENCY: 2x/week  PT DURATION: 12 weeks  PLANNED INTERVENTIONS: Therapeutic exercises, Therapeutic activity, Neuromuscular re-education, Balance training, Gait training, Patient/Family education, Self Care, Joint mobilization, Stair training, Vestibular training, Canalith repositioning, Dry Needling, Electrical stimulation, Spinal manipulation, Spinal mobilization, Cryotherapy, Moist heat, Traction, and Manual therapy  PLAN FOR NEXT SESSION:   Continue PT POC, look at potential radial nerve entrapment      Gwenlyn Saran, PT, DPT Physical Therapist - [facility]  05/10/22, 11:41 AM

## 2022-05-13 ENCOUNTER — Ambulatory Visit: Payer: Medicaid Other | Admitting: Physical Therapy

## 2022-05-14 ENCOUNTER — Ambulatory Visit: Payer: Medicaid Other | Admitting: Physical Therapy

## 2022-05-14 DIAGNOSIS — R29898 Other symptoms and signs involving the musculoskeletal system: Secondary | ICD-10-CM | POA: Diagnosis not present

## 2022-05-14 DIAGNOSIS — R278 Other lack of coordination: Secondary | ICD-10-CM

## 2022-05-14 DIAGNOSIS — M6281 Muscle weakness (generalized): Secondary | ICD-10-CM

## 2022-05-14 DIAGNOSIS — G35 Multiple sclerosis: Secondary | ICD-10-CM

## 2022-05-14 NOTE — Therapy (Signed)
OUTPATIENT PHYSICAL THERAPY NEURO TREATMENT    Patient Name: [name] MRN: [id number] DOB:09-Aug-1980, 42 y.o., adult Today's Date: 05/14/2022   PCP: Paulita Cradle, MD REFERRING PROVIDER: Paulita Cradle, MD  END OF SESSION:   PT End of Session - 05/14/22 1324     Visit Number 17    Number of Visits 24    PT Start Time 1202    PT Stop Time 1230    PT Time Calculation (min) 28 min    Activity Tolerance Patient tolerated treatment well;No increased pain    Behavior During Therapy WFL for tasks assessed/performed                Past Medical History:  Diagnosis Date   Asthma    Breast cyst    Breast mass    Chronic fatigue syndrome    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HCC)    Depression    Encephalomyelitis    Fibrocystic breast    Fibromyalgia    Long COVID    Migraines    MS (multiple sclerosis) ([place])    Past Surgical History:  Procedure Laterality Date   BUNIONECTOMY Bilateral    COLONOSCOPY WITH PROPOFOL N/A 08/19/2019   Procedure: COLONOSCOPY WITH PROPOFOL;  Surgeon: Lin Landsman, MD;  Location: [facility] ENDOSCOPY;  Service: Gastroenterology;  Laterality: N/A;   ESOPHAGOGASTRODUODENOSCOPY (EGD) WITH PROPOFOL N/A 08/19/2019   Procedure: ESOPHAGOGASTRODUODENOSCOPY (EGD) WITH PROPOFOL;  Surgeon: Lin Landsman, MD;  Location: [facility] ENDOSCOPY;  Service: Gastroenterology;  Laterality: N/A;   EYE SURGERY     blood clot in eye   HERNIA REPAIR  1991, 2001   inguinal and umbilical   LAPAROSCOPIC GASTRIC SLEEVE RESECTION  2015   TOTAL MASTECTOMY Bilateral    Patient Active Problem List   Diagnosis Date Noted   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   Borderline personality disorder ([place]) 09/21/2020   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/2018   Dislocation of patellofemoral joint 10/13/2017   Labor and delivery indication for care or intervention 02/15/2017   Pregnancy  01/01/2017   Encounter for procreative genetic counseling 99991111   Umbilical hernia without obstruction and without gangrene    Norovirus 06/24/2016   ADHD (attention deficit hyperactivity disorder) 06/20/2016   Anxiety 06/20/2016   Asthma without status asthmaticus 06/20/2016   Depression 06/20/2016   Fibrocystic breast disease 06/20/2016   Fibromyalgia 06/20/2016   Migraine 06/20/2016   Ovarian cyst 06/20/2016   Morbid obesity ([place]) 02/24/2014   Headache 07/26/2013   KNEE PAIN, BILATERAL 01/02/2010   TENDINITIS, PATELLAR 01/02/2010    ONSET DATE: April 2023  REFERRING DIAG:  G35 (ICD-10-CM) - Multiple sclerosis   THERAPY DIAG:  Multiple sclerosis ([place])  Other lack of coordination  Muscle weakness (generalized)  Rationale for Evaluation and Treatment: Rehabilitation  SUBJECTIVE:   SUBJECTIVE STATEMENT:  Pt reports that pain and tightness in the RUE has significantly improved since prior session. Pt stating improved ROM in elbow, shoulder and increased grip strength. Pt Also stating that there is tightness in mid thoracic region requesting TDN and manual therapy for Pain modulation.    Pt accompanied by: self  PERTINENT HISTORY:   Pt reports multifactorial medical history contributing to deficits and impairment. Pt reports significant impairment 2/2 to long-covid diagnosed December 2022, particularly expressive aphasia.  Pt notes aphasia worse since RSV infection in early Dec 2023. In PT eval here, pt uses mobile phone app for expressive language, with  improved use of verbal expressive language as anxiety decreases- pt reports significant and fluctuating anomia. Baseline pt speaks [place], [place], and ASL, sometimes is more easily able to express self via ASL. At eval pt using don joy knee brace RLE due to Hx PCL rupture and hx of patella dislocation. Pt diagnosed with MS.   PAIN: Right wrist and right wrist extensors, does not rate  PRECAUTIONS: None  WEIGHT  BEARING RESTRICTIONS: No  FALLS: Has patient fallen in last 6 months? Rt dorsal hand pain flared up, visible knots in Left wrist extensors  PLOF: Independent  PATIENT GOALS: Strengthening due to losing a lot of atrophy.  Pt had weight loss surgery 2x.  Pt states the MD is wanting pt to increase the stability of the R knee.  Pt wants to be able to walk without pain.  OBJECTIVE:    Intervention This Date: 05/14/22   UPPER EXTREMITY MMT:  MMT Right eval Left eval  Grip strength 86 83  (Blank rows = not tested)    Manual:   STM with TP L rhomboid, and middle trap. PT performed AP and rotational grade 2-3 join mobs to C5-T8. 30 sec each. Noted hypomobility in C7-T4. Educated pt on need for strengthening and improved mobility through mid thoracic region.     Trigger Point Dry Needling (TDN), unbilled Education performed with patient regarding potential benefit of TDN. Reviewed precautions and risks with patient. Reviewed special precautions/risks over lung fields which include pneumothorax. Reviewed signs and symptoms of pneumothorax and advised pt to go to ER immediately if these symptoms develop advise them of dry needling treatment. Extensive time spent with pt to ensure full understanding of TDN risks. Pt provided verbal consent to treatment. TDN performed to L rhomboid, mid lat, and subscap for pain modulation with 0. 3x 30  and 0.3x0.6 single needle placements with local twitch response (LTR). Pistoning technique utilized. Improved pain-free motion following intervention.      PATIENT EDUCATION: Education details: Pt educated throughout session about improved exercise technique, movement at target joints, use of target muscles after min to mod verbal, visual, tactile cues.  Person educated: Patient Education method: Special educational needs teacher, TC Education comprehension: verbalized understanding; Return demo  HOME EXERCISE PROGRAM:  Bilateral foot eversion Alphabet each LE March  with df with YTB    GOALS:  Goals reviewed with patient? Yes  SHORT TERM GOALS: Target date: 03/21/2022  Pt will be independent with HEP in order to demonstrate increased ability to perform tasks related to occupation/hobbies. Baseline: 1/24: HEP compliant  Goal status:MET   LONG TERM GOALS: Target date: 05/16/2022  1.  Patient will complete five times sit to stand test in < 10 seconds indicating an increased LE strength and improved balance. Baseline: 12.83 sec 1/24: 9 seconds Goal status: MET  2.  Patient will increase FOTO score to equal to or greater than 46 to demonstrate statistically significant improvement in mobility and quality of life.  Baseline: 36 1/24: 40% Goal status: Partially Met   3.  Patient will increase FGA score by > 4 points to demonstrate decreased fall risk during functional activities. Baseline: Not performed at initial evaluation. 12/21: 20/30 1/24: 23% Goal status: Partially Met   4.  Patient will reduce timed up and go to <11 seconds to reduce fall risk and demonstrate improved transfer/gait ability. Baseline: Not performed at initial evaluation. 12/21: 6 seconds Goal status: MET  5.  Patient will increase 10 meter walk test to >1.74ms as to improve  gait speed for better community ambulation and to reduce fall risk. Baseline: Not performed at initial evaluation. 1/24: 6.5 seconds =1.5 m/s Goal status: MET   6.  Patient will increase six minute walk test distance to >1800 for progression to community ambulator and improve gait ability Baseline: Not performed at initial evaluation. 12/1: 1405 ft 1/24: perform next session.; 04/08/22: 1500' Goal status: PARTIALLY MET    ASSESSMENT:  CLINICAL IMPRESSION:  Pt treatment session limited due to late arrival by pt.  Pt reports dramatically improved use of RUE following manual therapy and TDN performed to supinator and extensor bundle in prior session. Pt reports that greatest limitation in movement and  function from Mid thoracic      OBJECTIVE IMPAIRMENTS:  Abnormal gait, decreased activity tolerance, decreased balance, decreased endurance, decreased knowledge of condition, decreased mobility, difficulty walking, decreased strength, dizziness, impaired sensation, and pain.   ACTIVITY LIMITATIONS: lifting, bending, sitting, standing, squatting, sleeping, stairs, and locomotion level  PARTICIPATION LIMITATIONS: cleaning, laundry, interpersonal relationship, community activity, and occupation  PERSONAL FACTORS: Age, Education, Past/current experiences, Time since onset of injury/illness/exacerbation, and 3+ comorbidities: MS, Depression, Fibromyalgia, Rupture of PCL  are also affecting patient's functional outcome.   REHAB POTENTIAL: Good  CLINICAL DECISION MAKING: Stable/uncomplicated  EVALUATION COMPLEXITY: High  PLAN:  PT FREQUENCY: 2x/week  PT DURATION: 12 weeks  PLANNED INTERVENTIONS: Therapeutic exercises, Therapeutic activity, Neuromuscular re-education, Balance training, Gait training, Patient/Family education, Self Care, Joint mobilization, Stair training, Vestibular training, Canalith repositioning, Dry Needling, Electrical stimulation, Spinal manipulation, Spinal mobilization, Cryotherapy, Moist heat, Traction, and Manual therapy  PLAN FOR NEXT SESSION:   Continue PT POC, assess midthoracic tightness and performed postural ROM and strengthening.  look at potential radial nerve entrapment       Barrie Folk PT, DPT  Physical Therapist - [facility] 1:28 PM 05/14/22

## 2022-05-16 ENCOUNTER — Encounter: Payer: Self-pay | Admitting: Physical Therapy

## 2022-05-16 ENCOUNTER — Ambulatory Visit: Payer: Medicaid Other | Admitting: Physical Therapy

## 2022-05-16 DIAGNOSIS — R29898 Other symptoms and signs involving the musculoskeletal system: Secondary | ICD-10-CM | POA: Diagnosis not present

## 2022-05-16 DIAGNOSIS — F419 Anxiety disorder, unspecified: Secondary | ICD-10-CM

## 2022-05-16 DIAGNOSIS — G35 Multiple sclerosis: Secondary | ICD-10-CM

## 2022-05-16 DIAGNOSIS — M6281 Muscle weakness (generalized): Secondary | ICD-10-CM

## 2022-05-16 NOTE — Therapy (Addendum)
OUTPATIENT PHYSICAL THERAPY NEURO TREATMENT / DISCHARGE SUMMARY   Patient Name: DARIKSON NAGY MRN: [id number] DOB:1980/10/22, 42 y.o., adult Today's Date: 05/16/2022   PCP: Paulita Cradle, MD REFERRING PROVIDER: Paulita Cradle, MD  END OF SESSION:   PT End of Session - 05/16/22 1145     Visit Number 18    Number of Visits 24    Date for PT Re-Evaluation 05/16/22    Authorization Type [place] [employer]    Authorization Time Period 02/21/22-05/16/22    PT Start Time 1148    PT Stop Time 1228    PT Time Calculation (min) 40 min    Activity Tolerance Patient tolerated treatment well;No increased pain    Behavior During Therapy WFL for tasks assessed/performed                 Past Medical History:  Diagnosis Date   Asthma    Breast cyst    Breast mass    Chronic fatigue syndrome    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HCC)    Depression    Encephalomyelitis    Fibrocystic breast    Fibromyalgia    Long COVID    Migraines    MS (multiple sclerosis) ([place])    Past Surgical History:  Procedure Laterality Date   BUNIONECTOMY Bilateral    COLONOSCOPY WITH PROPOFOL N/A 08/19/2019   Procedure: COLONOSCOPY WITH PROPOFOL;  Surgeon: Lin Landsman, MD;  Location: [facility] ENDOSCOPY;  Service: Gastroenterology;  Laterality: N/A;   ESOPHAGOGASTRODUODENOSCOPY (EGD) WITH PROPOFOL N/A 08/19/2019   Procedure: ESOPHAGOGASTRODUODENOSCOPY (EGD) WITH PROPOFOL;  Surgeon: Lin Landsman, MD;  Location: [facility] ENDOSCOPY;  Service: Gastroenterology;  Laterality: N/A;   EYE SURGERY     blood clot in eye   HERNIA REPAIR  1991, 2001   inguinal and umbilical   LAPAROSCOPIC GASTRIC SLEEVE RESECTION  2015   TOTAL MASTECTOMY Bilateral    Patient Active Problem List   Diagnosis Date Noted   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   Borderline personality disorder ([place]) 09/21/2020   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   Status post bariatric  surgery 11/10/2018   Dislocation of patellofemoral joint 10/13/2017   Labor and delivery indication for care or intervention 02/15/2017   Pregnancy 01/01/2017   Encounter for procreative genetic counseling [id number]   Umbilical hernia without obstruction and without gangrene    Norovirus 06/24/2016   ADHD (attention deficit hyperactivity disorder) 06/20/2016   Anxiety 06/20/2016   Asthma without status asthmaticus 06/20/2016   Depression 06/20/2016   Fibrocystic breast disease 06/20/2016   Fibromyalgia 06/20/2016   Migraine 06/20/2016   Ovarian cyst 06/20/2016   Morbid obesity ([place]) 02/24/2014   Headache 07/26/2013   KNEE PAIN, BILATERAL 01/02/2010   TENDINITIS, PATELLAR 01/02/2010    ONSET DATE: April 2023  REFERRING DIAG:  G35 (ICD-10-CM) - Multiple sclerosis   THERAPY DIAG:  Multiple sclerosis ([place])  Muscle weakness (generalized)  Weakness of right lower extremity  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 Reports back feels good at this time but is too soon to tell if dry needling has completely relived this pain and discomfort.  Patient is happy with physical therapy progress thus far.    Pt accompanied by: self  PERTINENT HISTORY:   Pt reports multifactorial medical history contributing to deficits and impairment. Pt reports significant impairment  2/2 to long-covid diagnosed December 2022, particularly expressive aphasia.  Pt notes aphasia worse since RSV infection in early Dec 2023. In PT eval here, pt uses mobile phone app for expressive language, with improved use of verbal expressive language as anxiety decreases- pt reports significant and fluctuating anomia. Baseline pt speaks [place], [place], and ASL, sometimes is more easily able to express self via ASL. At eval pt using don joy  knee brace RLE due to Hx PCL rupture and hx of patella dislocation. Pt diagnosed with MS.   PAIN: Right wrist and right wrist extensors, does not rate  PRECAUTIONS: None  WEIGHT BEARING RESTRICTIONS: No  FALLS: Has patient fallen in last 6 months? Rt dorsal hand pain flared up, visible knots in Left wrist extensors  PLOF: Independent  PATIENT GOALS: Strengthening due to losing a lot of atrophy.  Pt had weight loss surgery 2x.  Pt states the MD is wanting pt to increase the stability of the R knee.  Pt wants to be able to walk without pain.  OBJECTIVE:    Intervention This Date: 05/16/22    UPPER EXTREMITY MMT:  MMT Right eval Left eval  Grip strength 86 83  (Blank rows = not tested)    Physical therapy treatment session today consisted of completing assessment of goals and administration of testing as demonstrated and documented in flow sheet, treatment, and goals section of this note. Addition treatments may be found below.        PATIENT EDUCATION: Education details: Pt educated throughout session about improved exercise technique, movement at target joints, use of target muscles after min to mod verbal, visual, tactile cues.  Person educated: Patient Education method: Special educational needs teacher, TC Education comprehension: verbalized understanding; Return demo  HOME EXERCISE PROGRAM:  Bilateral foot eversion Alphabet each LE March with df with YTB    GOALS:  Goals reviewed with patient? Yes  SHORT TERM GOALS: Target date: 03/21/2022  Pt will be independent with HEP in order to demonstrate increased ability to perform tasks related to occupation/hobbies. Baseline: 1/24: HEP compliant  Goal status:MET   LONG TERM GOALS: Target date: 05/16/2022  1.  Patient will complete five times sit to stand test in < 10 seconds indicating an increased LE strength and improved balance. Baseline: 12.83 sec 1/24: 9 seconds Goal status: MET  2.  Patient will increase FOTO score  to equal to or greater than 46 to demonstrate statistically significant improvement in mobility and quality of life.  Baseline: 36 1/24: 40% 05/16/22:50 Goal status: Met   3.  Patient will increase FGA score by > 4 points to demonstrate decreased fall risk during functional activities. Baseline: Not performed at initial evaluation. 12/21: 20/30 1/24: 23% 3/7:27% Goal status: MET  4.  Patient will reduce timed up and go to <11 seconds to reduce fall risk and demonstrate improved transfer/gait ability. Baseline: Not performed at initial evaluation. 12/21: 6 seconds Goal status: MET  5.  Patient will increase 10 meter walk test to >1.65ms as to improve gait speed for better community ambulation and to reduce fall risk. Baseline: Not performed at initial evaluation. 1/24: 6.5 seconds =1.5 m/s Goal status: MET   6.  Patient will increase six minute walk test distance to >1800 for progression to community ambulator and improve gait ability Baseline: Not performed at initial evaluation. 12/1: 1405 ft 1/24: perform next session.; 04/08/22: 1500' 05/16/22:1500 ft, fatigue noted and discomfort noted in upper back/ Thoracic spine Goal status: PARTIALLY MET  ASSESSMENT:  CLINICAL IMPRESSION:  Pt presents to PT for goal assessment.  Patient has made significant progress towards their balance goals as evidenced by significantly improved functional gait assessment indicating decreased risk of falls and improved ability to functionally ambulate within the community.  Patient is also made significant progress with their focus on therapeutic outcomes survey indicating improved subjective level of physical function. Patient still having discomfort in the back and shoulder at this time and if patient comes back for more PT at any time then further assessment and goals and  evaluation will be completed to specifically target back and upper extremity function deficits.  Outside of 6-minute walk test patient has met  all goals for balance, strength, and function particularly with lower extremity. Pt is to be discharged from PT at this time with HEP and is happy with this plan.       OBJECTIVE IMPAIRMENTS:  Abnormal gait, decreased activity tolerance, decreased balance, decreased endurance, decreased knowledge of condition, decreased mobility, difficulty walking, decreased strength, dizziness, impaired sensation, and pain.   ACTIVITY LIMITATIONS: lifting, bending, sitting, standing, squatting, sleeping, stairs, and locomotion level  PARTICIPATION LIMITATIONS: cleaning, laundry, interpersonal relationship, community activity, and occupation  PERSONAL FACTORS: Age, Education, Past/current experiences, Time since onset of injury/illness/exacerbation, and 3+ comorbidities: MS, Depression, Fibromyalgia, Rupture of PCL  are also affecting patient's functional outcome.   REHAB POTENTIAL: Good  CLINICAL DECISION MAKING: Stable/uncomplicated  EVALUATION COMPLEXITY: High  PLAN:  PT FREQUENCY: 1x/week  PT DURATION: 6 weeks  PLANNED INTERVENTIONS: Therapeutic exercises, Therapeutic activity, Neuromuscular re-education, Balance training, Gait training, Patient/Family education, Self Care, Joint mobilization, Stair training, Vestibular training, Canalith repositioning, Dry Needling, Electrical stimulation, Spinal manipulation, Spinal mobilization, Cryotherapy, Moist heat, Traction, and Manual therapy  PLAN FOR NEXT SESSION:   D/C      Particia Lather PT ,DPT Physical Therapist- [facility]   1:28 PM 05/16/22

## 2022-05-21 ENCOUNTER — Ambulatory Visit: Payer: Medicaid Other | Admitting: Physical Therapy

## 2022-05-23 ENCOUNTER — Ambulatory Visit: Payer: Medicaid Other | Admitting: Physical Therapy

## 2022-05-27 ENCOUNTER — Encounter: Payer: BC Managed Care – PPO | Admitting: Physical Therapy

## 2022-05-29 ENCOUNTER — Encounter: Payer: BC Managed Care – PPO | Admitting: Physical Therapy

## 2022-05-29 ENCOUNTER — Encounter (INDEPENDENT_AMBULATORY_CARE_PROVIDER_SITE_OTHER): Payer: Self-pay

## 2022-05-30 ENCOUNTER — Encounter: Payer: BC Managed Care – PPO | Admitting: Physical Therapy

## 2022-06-03 ENCOUNTER — Encounter: Payer: BC Managed Care – PPO | Admitting: Physical Therapy

## 2022-06-12 ENCOUNTER — Encounter: Payer: BC Managed Care – PPO | Admitting: Physical Therapy

## 2022-07-02 ENCOUNTER — Encounter: Payer: BC Managed Care – PPO | Admitting: Physical Therapy

## 2022-07-24 ENCOUNTER — Encounter: Payer: BC Managed Care – PPO | Admitting: Physical Therapy

## 2022-07-25 ENCOUNTER — Encounter: Payer: BC Managed Care – PPO | Admitting: Physical Therapy

## 2022-11-18 ENCOUNTER — Other Ambulatory Visit: Payer: Self-pay

## 2022-11-18 ENCOUNTER — Ambulatory Visit
Admission: RE | Admit: 2022-11-18 | Discharge: 2022-11-18 | Disposition: A | Discharge status: Home / Self Care | Payer: Medicaid Other | Source: Ambulatory Visit | Attending: Family Medicine | Admitting: Family Medicine

## 2022-11-18 VITALS — BP 147/92 | HR 76 | Temp 98.4°F | Resp 16

## 2022-11-18 DIAGNOSIS — L739 Follicular disorder, unspecified: Secondary | ICD-10-CM | POA: Diagnosis not present

## 2022-11-18 MED ORDER — SULFAMETHOXAZOLE-TRIMETHOPRIM 800-160 MG PO TABS: 1.0000 | ORAL_TABLET | Freq: Two times a day (BID) | ORAL | 0 refills | Status: AC

## 2022-11-18 NOTE — Discharge Instructions (Addendum)
Use antibacterial wash Use antibacterial lotions Try not to traumatize your skin Take the Septra antibiotic as directed.  Take with food See your primary care doctor in follow-up

## 2022-11-18 NOTE — ED Provider Notes (Signed)
[name]    CSN: [id number] Arrival date & time: 11/18/22  1407      History   Chief Complaint Chief Complaint  Patient presents with   Rash    Entered by patient    HPI [name] is a 42 y.o. adult.   HPI  Patient has scattered bumps on both legs.  Scattered bumps on his chest.  He wants to have these looked at.  Thinks it is a rash.  Is worried that it could be MRSA.  He has a daughter that has had MRSA in the past.  They are tiny pimples.  As soon as the pimple shows up he picks at it.  No active pustules at this time.  No itching or pain.  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   Labor and delivery indication for care or intervention 02/15/2017   Encounter for procreative genetic counseling 08/02/2016   Umbilical hernia without obstruction and without gangrene    ADHD (attention deficit hyperactivity disorder) 06/20/2016   Anxiety 06/20/2016   Asthma without status asthmaticus 06/20/2016   Depression 06/20/2016   Fibrocystic breast disease 06/20/2016   Fibromyalgia 06/20/2016   Migraine 06/20/2016   Ovarian cyst 06/20/2016   Morbid obesity (HCC) 02/24/2014   KNEE PAIN, BILATERAL 01/02/2010   TENDINITIS, PATELLAR 01/02/2010    Past Surgical History:  Procedure Laterality Date   BUNIONECTOMY Bilateral    COLONOSCOPY WITH PROPOFOL N/A 08/19/2019   Procedure: COLONOSCOPY WITH PROPOFOL;  Surgeon: Toney Reil, MD;  Location: [facility] ENDOSCOPY;  Service: Gastroenterology;   Laterality: N/A;   ESOPHAGOGASTRODUODENOSCOPY (EGD) WITH PROPOFOL N/A 08/19/2019   Procedure: ESOPHAGOGASTRODUODENOSCOPY (EGD) WITH PROPOFOL;  Surgeon: Toney Reil, MD;  Location: [facility] ENDOSCOPY;  Service: Gastroenterology;  Laterality: N/A;   EYE SURGERY     blood clot in eye   HERNIA REPAIR  1991, 2001   inguinal and umbilical   LAPAROSCOPIC GASTRIC SLEEVE RESECTION  2015   TOTAL MASTECTOMY Bilateral     OB History     Gravida  2   Para      Term      Preterm      AB      Living  0      SAB  0   IAB      Ectopic      Multiple      Live Births               Home Medications    Prior to Admission medications   Medication Sig Start Date End Date Taking? Authorizing Provider  Interferon Beta-1a (AVONEX IM) Inject 30 mcg into the muscle.   Yes [provider]  sulfamethoxazole-trimethoprim (BACTRIM DS) 800-160 MG tablet Take 1 tablet by mouth 2 (two) times daily for 7 days. Then take 1 a day for 2 weeks 11/18/22 11/25/22 Yes Eustace Moore, MD  albuterol (VENTOLIN HFA) 108 (90  Base) MCG/ACT inhaler Inhale 1-2 puffs into the lungs every 4 (four) hours as needed for shortness of breath or wheezing.    [provider]  ALPRAZolam Prudy Feeler) 0.5 MG tablet Take 0.5 mg by mouth daily as needed for anxiety. 02/09/21   [provider]  amphetamine-dextroamphetamine (ADDERALL) 20 MG tablet Take 20 mg by mouth 3 (three) times daily. 03/07/21   [provider]  B Complex-C (SUPER B COMPLEX/VITAMIN C PO) Take 1 tablet by mouth daily.     [provider]  BD DISP NEEDLES 25G X 5/8" MISC SMARTSIG:Injection Once a Week 09/29/19   [provider]  busPIRone (BUSPAR) 15 MG tablet Take 15 mg by mouth 4 (four) times daily. 10/09/20   [provider]  Calcium Carbonate (CALCIUM 500 PO) Take 500 mg by mouth 4 (four) times daily. chewable    [provider]  CALCIUM MAGNESIUM ZINC PO Take by mouth.    [provider]  Cholecalciferol (VITAMIN D3 PO) Take by mouth.    [provider]  CREON 36000-114000 units CPEP capsule Take 72,000 Units by mouth 3 (three) times daily with meals. 03/19/21   [provider]  diclofenac Sodium (VOLTAREN) 1 % GEL Apply topically 4 (four) times daily.    [provider]  dronabinol (MARINOL) 5 MG capsule Take 5 mg by mouth 2 (two) times daily as needed (abdominal pain). 06/24/19   [provider]  fluvoxaMINE (LUVOX) 50 MG tablet Take 50 mg by mouth 2 (two) times daily. Takes along with 25 to total 75 mg 03/20/21   [provider]  HYDROXYZINE HCL PO Take by mouth.    [provider]  metoprolol succinate (TOPROL-XL) 25 MG 24 hr tablet Take 25 mg by mouth daily. 03/21/21   [provider]  Multiple Vitamins-Minerals (ADEK GUMMIES PLUS ZN PO) Take 1 tablet by mouth daily.    [provider]  ondansetron (ZOFRAN-ODT) 4 MG disintegrating tablet Take 4 mg by mouth every 8 (eight) hours as needed for vomiting or nausea. 04/10/19   [provider]  OVER THE COUNTER MEDICATION Take 1 capsule by mouth daily. Lypoic acid    [provider]  pantoprazole (PROTONIX) 40 MG tablet Take 40 mg by mouth daily.    [provider]  promethazine-dextromethorphan (PROMETHAZINE-DM) 6.25-15 MG/5ML syrup Take 5 mLs by mouth 3 (three) times daily as needed for cough. 09/25/21   Raspet, Noberto Retort, PA-C  testosterone cypionate (DEPOTESTOSTERONE CYPIONATE) 200 MG/ML injection Inject into the skin every Saturday. Taken 0.69ml 09/29/19   [provider]  UNABLE TO FIND Med Name: capaxone inj 3 times/wk    [provider]  vitamin E 1000 UNIT capsule Take 1,000 Units by mouth daily.    [provider]  zinc gluconate 50 MG tablet Take 50 mg by mouth in the morning and at bedtime.    [provider]  zolmitriptan (ZOMIG-ZMT) 2.5 MG disintegrating tablet Take 2.5 mg by mouth  daily as needed for migraine. 03/24/19   [provider]    Family History Family History  Problem Relation Age of Onset   Breast cancer Paternal Aunt    Breast cancer Paternal Aunt    Healthy Mother    CVA Father    Hypertension Father    Diabetes Father    Breast cancer Paternal Grandmother     Social History Social History   Tobacco Use   Smoking status: Former    Current packs/day:  0.00    Types: Cigarettes    Quit date: 06/21/2003    Years since quitting: 19.4   Smokeless tobacco: Never  Vaping Use   Vaping status: Never Used  Substance Use Topics   Alcohol use: No   Drug use: No     Allergies   Penicillins, Remdesivir, Chlorhexidine gluconate, Clonazepam, Dexamethasone, Doxycycline, Fluoxetine, Nsaids, Hydrocodone-acetaminophen, Tape, and Tuberculin ppd   Review of Systems Review of Systems  See HPI Physical Exam Triage Vital Signs ED Triage Vitals  Encounter Vitals Group     BP 11/18/22 1413 (!) 147/92     Systolic BP Percentile --      Diastolic BP Percentile --      Pulse Rate 11/18/22 1413 76     Resp 11/18/22 1413 16     Temp 11/18/22 1413 98.4 F (36.9 C)     Temp Source 11/18/22 1413 Oral     SpO2 11/18/22 1413 99 %     Weight --      Height --      Head Circumference --      Peak Flow --      Pain Score 11/18/22 1414 2     Pain Loc --      Pain Education --      Exclude from Growth Chart --    No data found.  Updated Vital Signs BP (!) 147/92 (BP Location: Right Arm)   Pulse 76   Temp 98.4 F (36.9 C) (Oral)   Resp 16   SpO2 99%       Physical Exam Constitutional:      General: Brandel is not in acute distress.    Appearance: Kyton is well-developed.  HENT:     Head: Normocephalic and atraumatic.  Eyes:     Conjunctiva/sclera: Conjunctivae normal.     Pupils: Pupils are equal, round, and reactive to light.  Cardiovascular:     Rate and Rhythm: Normal rate.  Pulmonary:     Effort: Pulmonary effort is normal. No  respiratory distress.  Abdominal:     General: There is no distension.     Palpations: Abdomen is soft.  Musculoskeletal:        General: Normal range of motion.     Cervical back: Normal range of motion.  Skin:    General: Skin is warm and dry.     Comments: Patient has multiple small picks lesions on both thighs.  He has a few small pustules on his chest that have all been opened.  There is nothing to culture  Neurological:     Mental Status: Taveion is alert.      UC Treatments / Results  Labs (all labs ordered are listed, but only abnormal results are displayed) Labs Reviewed - No data to display  EKG   Radiology No results found.  Procedures Procedures (including critical care time)  Medications Ordered in UC Medications - No data to display  Initial Impression / Assessment and Plan / UC Course  I have reviewed the triage vital signs and the nursing notes.  Pertinent labs & imaging results that were available during my care of the patient were reviewed by me and considered in my medical decision making (see chart for details).     I explained he has folliculitis.  It would be better if he did not pick these lesions.  Antibacterial soaps and lotions will probably take care of it.  Since he has a number of lesions right now  that worry him I will give him a week of Septra.  I recommended doxycycline but he is allergic.  He is also allergic to Hibiclens. Final Clinical Impressions(s) / UC Diagnoses   Final diagnoses:  Folliculitis     Discharge Instructions      Use antibacterial wash Use antibacterial lotions Try not to traumatize your skin Take the Septra antibiotic as directed.  Take with food See your primary care doctor in follow-up   ED Prescriptions     Medication Sig Dispense Auth. Provider   sulfamethoxazole-trimethoprim (BACTRIM DS) 800-160 MG tablet Take 1 tablet by mouth 2 (two) times daily for 7 days. Then take 1 a day for 2 weeks 28 tablet  Delton See Letta Pate, MD      PDMP not reviewed this encounter.   Eustace Moore, MD 11/18/22 [phone]

## 2022-11-18 NOTE — ED Triage Notes (Signed)
C/o bump to back of right thigh, reports various bumps to chest tattoo

## 2022-12-17 ENCOUNTER — Other Ambulatory Visit: Payer: Self-pay

## 2022-12-17 ENCOUNTER — Encounter (HOSPITAL_BASED_OUTPATIENT_CLINIC_OR_DEPARTMENT_OTHER): Payer: Self-pay

## 2022-12-17 ENCOUNTER — Emergency Department (HOSPITAL_BASED_OUTPATIENT_CLINIC_OR_DEPARTMENT_OTHER)
Admission: EM | Admit: 2022-12-17 | Discharge: 2022-12-17 | Disposition: A | Discharge status: Home / Self Care | Payer: BC Managed Care – PPO | Attending: Emergency Medicine | Admitting: Emergency Medicine

## 2022-12-17 DIAGNOSIS — L299 Pruritus, unspecified: Secondary | ICD-10-CM

## 2022-12-17 DIAGNOSIS — R21 Rash and other nonspecific skin eruption: Secondary | ICD-10-CM | POA: Diagnosis present

## 2022-12-17 NOTE — ED Provider Notes (Signed)
[place] EMERGENCY DEPARTMENT AT [facility]  Provider Note  CSN: [id number] Arrival date & time: 12/17/22 0234  History Chief Complaint  Patient presents with   Rash    [name] is a 42 y.o. adult here for itching burning rash on neck, has been present for several weeks. Has had multiple telehealth visits with [facility] and has been given multiple prescriptions including antibiotics, antifungals, and antihistamines. Still complaining of burning.    Home Medications Prior to Admission medications   Medication Sig Start Date End Date Taking? Authorizing Provider  albuterol (VENTOLIN HFA) 108 (90 Base) MCG/ACT inhaler Inhale 1-2 puffs into the lungs every 4 (four) hours as needed for shortness of breath or wheezing.    [provider]  ALPRAZolam Prudy Feeler) 0.5 MG tablet Take 0.5 mg by mouth daily as needed for anxiety. 02/09/21   [provider]  amphetamine-dextroamphetamine (ADDERALL) 20 MG tablet Take 20 mg by mouth 3 (three) times daily. 03/07/21   [provider]  B Complex-C (SUPER B COMPLEX/VITAMIN C PO) Take 1 tablet by mouth daily.     [provider]  BD DISP NEEDLES 25G X 5/8" MISC SMARTSIG:Injection Once a Week 09/29/19   [provider]  busPIRone (BUSPAR) 15 MG tablet Take 15 mg by mouth 4 (four) times daily. 10/09/20   [provider]  Calcium Carbonate (CALCIUM 500 PO) Take 500 mg by mouth 4 (four) times daily. chewable    [provider]  CALCIUM MAGNESIUM ZINC PO Take by mouth.    [provider]  Cholecalciferol (VITAMIN D3 PO) Take by mouth.    [provider]  CREON 36000-114000 units CPEP capsule Take 72,000 Units by mouth 3 (three) times daily with meals. 03/19/21   [provider]  diclofenac Sodium (VOLTAREN) 1 % GEL Apply topically 4 (four) times daily.    [provider]  dronabinol (MARINOL) 5 MG capsule Take 5 mg by mouth 2 (two) times daily as needed (abdominal  pain). 06/24/19   [provider]  fluvoxaMINE (LUVOX) 50 MG tablet Take 50 mg by mouth 2 (two) times daily. Takes along with 25 to total 75 mg 03/20/21   [provider]  HYDROXYZINE HCL PO Take by mouth.    [provider]  Interferon Beta-1a (AVONEX IM) Inject 30 mcg into the muscle.    [provider]  metoprolol succinate (TOPROL-XL) 25 MG 24 hr tablet Take 25 mg by mouth daily. 03/21/21   [provider]  Multiple Vitamins-Minerals (ADEK GUMMIES PLUS ZN PO) Take 1 tablet by mouth daily.    [provider]  ondansetron (ZOFRAN-ODT) 4 MG disintegrating tablet Take 4 mg by mouth every 8 (eight) hours as needed for vomiting or nausea. 04/10/19   [provider]  OVER THE COUNTER MEDICATION Take 1 capsule by mouth daily. Lypoic acid    [provider]  pantoprazole (PROTONIX) 40 MG tablet Take 40 mg by mouth daily.    [provider]  promethazine-dextromethorphan (PROMETHAZINE-DM) 6.25-15 MG/5ML syrup Take 5 mLs by mouth 3 (three) times daily as needed for cough. 09/25/21   Raspet, Noberto Retort, PA-C  testosterone cypionate (DEPOTESTOSTERONE CYPIONATE) 200 MG/ML injection Inject into the skin every Saturday. Taken 0.49ml 09/29/19   [provider]  UNABLE TO FIND Med Name: capaxone inj 3 times/wk    [provider]  vitamin E 1000 UNIT capsule Take 1,000 Units by mouth daily.    [provider]  zinc  gluconate 50 MG tablet Take 50 mg by mouth in the morning and at bedtime.    [provider]  zolmitriptan (ZOMIG-ZMT) 2.5 MG disintegrating tablet Take 2.5 mg by mouth daily as needed for migraine. 03/24/19   [provider]     Allergies    Penicillins, Remdesivir, Chlorhexidine gluconate, Clonazepam, Dexamethasone, Doxycycline, Fluoxetine, Nsaids, Hydrocodone-acetaminophen, Tape, and Tuberculin ppd   Review of Systems   Review of Systems Please see HPI for pertinent positives and  negatives  Physical Exam BP 123/73 (BP Location: Right Arm)   Pulse 88   Temp 98.4 F (36.9 C) (Oral)   Resp 19   SpO2 99%   Physical Exam Vitals and nursing note reviewed.  HENT:     Head: Normocephalic.     Nose: Nose normal.  Eyes:     Extraocular Movements: Extraocular movements intact.  Pulmonary:     Effort: Pulmonary effort is normal.  Musculoskeletal:        General: Normal range of motion.     Cervical back: Neck supple.  Skin:    Findings: No rash (no discrete rash on neck, there is some erythema from him rubbing the area).  Neurological:     Mental Status: [name] is alert and oriented to person, place, and time.  Psychiatric:        Mood and Affect: Mood normal.     ED Results / Procedures / Treatments   EKG None  Procedures Procedures  Medications Ordered in the ED Medications - No data to display  Initial Impression and Plan  Patient here with pruritus without a discrete rash. He has already had multiple telehealth visits and has multiple prescriptions to manage his symptoms. No additional ED workup or interventions are necessary. Recommend he continue with outpatient management.   ED Course       MDM Rules/Calculators/A&P Medical Decision Making Problems Addressed: Pruritus: chronic illness or injury with exacerbation, progression, or side effects of treatment  Risk Prescription drug management.     Final Clinical Impression(s) / ED Diagnoses Final diagnoses:  Pruritus    Rx / DC Orders ED Discharge Orders     None        Pollyann Savoy, MD 12/17/22 [phone]

## 2022-12-17 NOTE — ED Notes (Addendum)
Patient reports rash x1 month from chin down to chest with new onset of rash around neck. Patient reports getting chest tattoo on September. Patient reports seeing PCP and dermatologist and getting prescriptions without relief. Patient reports rash itches. Hives noted to neck. Patient actively itching chin, neck, and chest upon assessment.

## 2022-12-17 NOTE — ED Triage Notes (Signed)
Pt states that he has had a rash on and off for the past month, got new tattoo on his chest, has seen dermatology and given several new medications. Tonight the itching is worse, has hives on is neck.

## 2023-01-10 ENCOUNTER — Telehealth (HOSPITAL_COMMUNITY): Payer: Self-pay | Admitting: Emergency Medicine

## 2023-01-10 ENCOUNTER — Encounter (HOSPITAL_COMMUNITY): Payer: Self-pay | Admitting: Emergency Medicine

## 2023-01-10 ENCOUNTER — Ambulatory Visit (HOSPITAL_COMMUNITY)
Admission: EM | Admit: 2023-01-10 | Discharge: 2023-01-10 | Disposition: A | Discharge status: Home / Self Care | Payer: BC Managed Care – PPO | Attending: Emergency Medicine | Admitting: Emergency Medicine

## 2023-01-10 ENCOUNTER — Ambulatory Visit (INDEPENDENT_AMBULATORY_CARE_PROVIDER_SITE_OTHER): Payer: BC Managed Care – PPO

## 2023-01-10 DIAGNOSIS — K5900 Constipation, unspecified: Secondary | ICD-10-CM

## 2023-01-10 DIAGNOSIS — R1084 Generalized abdominal pain: Secondary | ICD-10-CM

## 2023-01-10 NOTE — Telephone Encounter (Signed)
Attempted to contact patient via phone for nonobstructive bowel pattern result on x-ray, no answer.

## 2023-01-10 NOTE — ED Triage Notes (Signed)
Pt reports thinks may have bowel obstruction again. Having bloating, abd pains, having soft stools when using laxatives and enemas.

## 2023-01-10 NOTE — ED Provider Notes (Signed)
[facility]    CSN: [id number] Arrival date & time: 01/10/23  1147      History   Chief Complaint Chief Complaint  Patient presents with   Constipation    HPI ZOEY GILKESON is a 42 y.o. adult.   Patient presents to clinic complaining of ongoing abdominal discomfort, constipation and bloating for the past 8 days.  They have been having abdominal discomfort with prolonged gas and intermittent bloating.  Patient has a history of a sleeve gastrectomy and an umbilical hernia. Reports history of bowel obstruction in 2021 requiring manual disimpaction and an NG tube.  Patient has been taking laxatives and using saline enemas with minimal results.  Having diarrhea, for feels like there is more stool in the colon.  Some nausea, no vomiting.    The history is provided by the patient and medical records.  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   Labor and delivery indication for care or intervention 02/15/2017   Encounter for procreative genetic counseling 08/02/2016   Umbilical hernia without obstruction and without gangrene    ADHD (attention deficit hyperactivity disorder) 06/20/2016   Anxiety 06/20/2016   Asthma without status asthmaticus 06/20/2016   Depression 06/20/2016   Fibrocystic breast disease 06/20/2016   Fibromyalgia 06/20/2016   Migraine 06/20/2016   Ovarian cyst 06/20/2016   Morbid obesity (HCC) 02/24/2014   KNEE PAIN, BILATERAL 01/02/2010   TENDINITIS, PATELLAR  01/02/2010    Past Surgical History:  Procedure Laterality Date   BUNIONECTOMY Bilateral    COLONOSCOPY WITH PROPOFOL N/A 08/19/2019   Procedure: COLONOSCOPY WITH PROPOFOL;  Surgeon: Toney Reil, MD;  Location: [facility] ENDOSCOPY;  Service: Gastroenterology;  Laterality: N/A;   ESOPHAGOGASTRODUODENOSCOPY (EGD) WITH PROPOFOL N/A 08/19/2019   Procedure: ESOPHAGOGASTRODUODENOSCOPY (EGD) WITH PROPOFOL;  Surgeon: Toney Reil, MD;  Location: [facility] ENDOSCOPY;  Service: Gastroenterology;  Laterality: N/A;   EYE SURGERY     blood clot in eye   HERNIA REPAIR  1991, 2001   inguinal and umbilical   LAPAROSCOPIC GASTRIC SLEEVE RESECTION  2015   TOTAL MASTECTOMY Bilateral     OB History     Gravida  2   Para      Term      Preterm      AB      Living  0      SAB  0   IAB      Ectopic      Multiple      Live Births               Home Medications    Prior to Admission medications   Medication Sig Start Date End Date Taking? Authorizing Provider  albuterol (VENTOLIN HFA) 108 (90 Base) MCG/ACT inhaler Inhale 1-2 puffs into the lungs every 4 (four) hours as needed for shortness of breath or wheezing.    [provider]  ALPRAZolam Prudy Feeler) 0.5  MG tablet Take 0.5 mg by mouth daily as needed for anxiety. 02/09/21   [provider]  amphetamine-dextroamphetamine (ADDERALL) 20 MG tablet Take 20 mg by mouth 3 (three) times daily. 03/07/21   [provider]  B Complex-C (SUPER B COMPLEX/VITAMIN C PO) Take 1 tablet by mouth daily.     [provider]  BD DISP NEEDLES 25G X 5/8" MISC SMARTSIG:Injection Once a Week 09/29/19   [provider]  busPIRone (BUSPAR) 15 MG tablet Take 15 mg by mouth 4 (four) times daily. 10/09/20   [provider]  Calcium Carbonate (CALCIUM 500 PO) Take 500 mg by mouth 4 (four) times daily. chewable    [provider]  CALCIUM MAGNESIUM ZINC PO Take by mouth.    [provider]   Cholecalciferol (VITAMIN D3 PO) Take by mouth.    [provider]  CREON 36000-114000 units CPEP capsule Take 72,000 Units by mouth 3 (three) times daily with meals. 03/19/21   [provider]  diclofenac Sodium (VOLTAREN) 1 % GEL Apply topically 4 (four) times daily.    [provider]  dronabinol (MARINOL) 5 MG capsule Take 5 mg by mouth 2 (two) times daily as needed (abdominal pain). 06/24/19   [provider]  fluvoxaMINE (LUVOX) 50 MG tablet Take 50 mg by mouth 2 (two) times daily. Takes along with 25 to total 75 mg 03/20/21   [provider]  HYDROXYZINE HCL PO Take by mouth.    [provider]  Interferon Beta-1a (AVONEX IM) Inject 30 mcg into the muscle.    [provider]  metoprolol succinate (TOPROL-XL) 25 MG 24 hr tablet Take 25 mg by mouth daily. 03/21/21   [provider]  Multiple Vitamins-Minerals (ADEK GUMMIES PLUS ZN PO) Take 1 tablet by mouth daily.    [provider]  ondansetron (ZOFRAN-ODT) 4 MG disintegrating tablet Take 4 mg by mouth every 8 (eight) hours as needed for vomiting or nausea. 04/10/19   [provider]  OVER THE COUNTER MEDICATION Take 1 capsule by mouth daily. Lypoic acid    [provider]  pantoprazole (PROTONIX) 40 MG tablet Take 40 mg by mouth daily.    [provider]  promethazine-dextromethorphan (PROMETHAZINE-DM) 6.25-15 MG/5ML syrup Take 5 mLs by mouth 3 (three) times daily as needed for cough. 09/25/21   Raspet, Noberto Retort, PA-C  testosterone cypionate (DEPOTESTOSTERONE CYPIONATE) 200 MG/ML injection Inject into the skin every Saturday. Taken 0.34ml 09/29/19   [provider]  UNABLE TO FIND Med Name: capaxone inj 3 times/wk    [provider]  vitamin E 1000 UNIT capsule Take 1,000 Units by mouth daily.    [provider]  zinc gluconate 50 MG tablet Take 50 mg by mouth in the morning and at bedtime.    [provider]   zolmitriptan (ZOMIG-ZMT) 2.5 MG disintegrating tablet Take 2.5 mg by mouth daily as needed for migraine. 03/24/19   [provider]    Family History Family History  Problem Relation Age of Onset   Breast cancer Paternal Aunt    Breast cancer Paternal Aunt    Healthy Mother    CVA Father    Hypertension Father    Diabetes Father    Breast cancer Paternal Grandmother     Social History Social History   Tobacco Use   Smoking status: Former    Current packs/day: 0.00    Types: Cigarettes    Quit date: 06/21/2003  Years since quitting: 19.5   Smokeless tobacco: Never  Vaping Use   Vaping status: Never Used  Substance Use Topics   Alcohol use: No   Drug use: No     Allergies   Penicillins, Remdesivir, Chlorhexidine gluconate, Clonazepam, Dexamethasone, Doxycycline, Fluoxetine, Nsaids, Hydrocodone-acetaminophen, Tape, and Tuberculin ppd   Review of Systems Review of Systems  Per HPI  Physical Exam Triage Vital Signs ED Triage Vitals  Encounter Vitals Group     BP 01/10/23 1249 (!) 141/80     Systolic BP Percentile --      Diastolic BP Percentile --      Pulse Rate 01/10/23 1249 80     Resp 01/10/23 1249 17     Temp 01/10/23 1249 97.9 F (36.6 C)     Temp Source 01/10/23 1249 Oral     SpO2 01/10/23 1249 98 %     Weight --      Height --      Head Circumference --      Peak Flow --      Pain Score 01/10/23 1247 7     Pain Loc --      Pain Education --      Exclude from Growth Chart --    No data found.  Updated Vital Signs BP (!) 141/80 (BP Location: Right Arm)   Pulse 80   Temp 97.9 F (36.6 C) (Oral)   Resp 17   SpO2 98%   Visual Acuity Right Eye Distance:   Left Eye Distance:   Bilateral Distance:    Right Eye Near:   Left Eye Near:    Bilateral Near:     Physical Exam Vitals and nursing note reviewed.  Constitutional:      Appearance: Normal appearance.  HENT:     Head: Normocephalic and atraumatic.     Right Ear:  External ear normal.     Left Ear: External ear normal.     Nose: Nose normal.     Mouth/Throat:     Mouth: Mucous membranes are moist.  Eyes:     Conjunctiva/sclera: Conjunctivae normal.  Cardiovascular:     Rate and Rhythm: Normal rate.  Pulmonary:     Effort: Pulmonary effort is normal. No respiratory distress.  Abdominal:     General: Abdomen is flat. Bowel sounds are normal. There is distension.     Palpations: Abdomen is soft.  Musculoskeletal:        General: Normal range of motion.  Skin:    General: Skin is warm and dry.  Neurological:     General: No focal deficit present.     Mental Status: Greig is alert.  Psychiatric:        Mood and Affect: Mood normal.      UC Treatments / Results  Labs (all labs ordered are listed, but only abnormal results are displayed) Labs Reviewed - No data to display  EKG   Radiology No results found.  Procedures Procedures (including critical care time)  Medications Ordered in UC Medications - No data to display  Initial Impression / Assessment and Plan / UC Course  I have reviewed the triage vital signs and the nursing notes.  Pertinent labs & imaging results that were available during my care of the patient were reviewed by me and considered in my medical decision making (see chart for details).  Vitals and triage reviewed, patient is hemodynamically stable.  Abdomen is soft with active bowel sounds, somewhat diminished in left  lower quadrant but still present.  Appears to have general abdominal distention.   Discussed with patient that due to history of bowel obstruction, similar presentation to last bowel obstruction and history of gastric sleeve that she would benefit from further evaluation in the emergency department with advanced imaging.  Discussed that while we can do abdominal x-rays looking for obstructive gas patterns, that this would still require ED follow-up.  Patient willing to proceed with abdominal imaging,  due to prolonged radiology interpretations we will contact with results.   Patient agreeable to plan.  Abdominal x-ray showed non-obstructive gas pattern. Left VM.      Final Clinical Impressions(s) / UC Diagnoses   Final diagnoses:  Generalized abdominal pain  Constipation, unspecified constipation type     Discharge Instructions       Unfortunately with your history of a bowel obstruction, and gastric sleeve surgery you are at high risk for another bowel obstruction.  Do not hesitate to seek care at the nearest emergency department if you develop emesis, worsening abdominal pain, or any new changes.  If you have not had a good bowel movement over the next day or so despite your over-the-counter interventions, please follow-up at the nearest emergency department.  We have obtained an abdominal x-ray to look at your bowel gas pattern and I will contact you if the radiologist interprets this as an obstruction.  In the meantime you can continue your over-the-counter laxatives, you can add Colace which is a stool softener.  Ensure you are staying well-hydrated as well.      ED Prescriptions   None    PDMP not reviewed this encounter.   Samer Dutton, [place], [place] 01/10/23 [phone]

## 2023-01-10 NOTE — Discharge Instructions (Addendum)
  Unfortunately with your history of a bowel obstruction, and gastric sleeve surgery you are at high risk for another bowel obstruction.  Do not hesitate to seek care at the nearest emergency department if you develop emesis, worsening abdominal pain, or any new changes.  If you have not had a good bowel movement over the next day or so despite your over-the-counter interventions, please follow-up at the nearest emergency department.  We have obtained an abdominal x-ray to look at your bowel gas pattern and I will contact you if the radiologist interprets this as an obstruction.  In the meantime you can continue your over-the-counter laxatives, you can add Colace which is a stool softener.  Ensure you are staying well-hydrated as well.

## 2023-03-11 ENCOUNTER — Telehealth: Payer: BC Managed Care – PPO | Admitting: Nurse Practitioner

## 2023-03-11 DIAGNOSIS — J40 Bronchitis, not specified as acute or chronic: Secondary | ICD-10-CM | POA: Diagnosis not present

## 2023-03-11 MED ORDER — ALBUTEROL SULFATE (2.5 MG/3ML) 0.083% IN NEBU
2.5000 mg | INHALATION_SOLUTION | Freq: Four times a day (QID) | RESPIRATORY_TRACT | 1 refills | Status: AC | PRN
Start: 2023-03-11 — End: ?

## 2023-03-11 MED ORDER — PROMETHAZINE-DM 6.25-15 MG/5ML PO SYRP
5.0000 mL | ORAL_SOLUTION | Freq: Four times a day (QID) | ORAL | 0 refills | Status: AC | PRN
Start: 2023-03-11 — End: 2023-03-21

## 2023-03-11 MED ORDER — AZITHROMYCIN 250 MG PO TABS
ORAL_TABLET | ORAL | 0 refills | Status: AC
Start: 2023-03-11 — End: 2023-03-16

## 2023-03-11 NOTE — Progress Notes (Signed)
 Virtual Visit Consent   [name], you are scheduled for a virtual visit with a [facility] provider today. Just as with appointments in the office, your consent must be obtained to participate. Your consent will be active for this visit and any virtual visit you may have with one of our providers in the next 365 days. If you have a MyChart account, a copy of this consent can be sent to you electronically.  As this is a virtual visit, video technology does not allow for your provider to perform a traditional examination. This may limit your provider's ability to fully assess your condition. If your provider identifies any concerns that need to be evaluated in person or the need to arrange testing (such as labs, EKG, etc.), we will make arrangements to do so. Although advances in technology are sophisticated, we cannot ensure that it will always work on either your end or our end. If the connection with a video visit is poor, the visit may have to be switched to a telephone visit. With either a video or telephone visit, we are not always able to ensure that we have a secure connection.  By engaging in this virtual visit, you consent to the provision of healthcare and authorize for your insurance to be billed (if applicable) for the services provided during this visit. Depending on your insurance coverage, you may receive a charge related to this service.  I need to obtain your verbal consent now. Are you willing to proceed with your visit today? [name] has provided verbal consent on 03/11/2023 for a virtual visit (video or telephone). Lauraine Kitty, FNP  Date: 03/11/2023 7:11 PM  Virtual Visit via Video Note   I, Lauraine Kitty, connected with  [name]  ([id number], [birth year]) on 03/11/23 at  7:15 PM EST by a video-enabled telemedicine application and verified that I am speaking with the correct person using two identifiers.  Location: Patient: Virtual Visit Location Patient:  Home Provider: Virtual Visit Location Provider: Home Office   I discussed the limitations of evaluation and management by telemedicine and the availability of in person appointments. The patient expressed understanding and agreed to proceed.    History of Present Illness: [name] is a 42 y.o. who identifies as a transgender male, and is being seen today for Coughing and fits causing gagging Sore throat, Migraine, Sensitivity in scalp and neck, Stuffy and runny nose, Fatigue, Loss of appetite, Fever, Body sore & Chills   Symptom onset was: 03/04/23  History of MS and is immunocompromised  Has 4 kids that have been sick that they have been exposed to   Worst symptom is the cough  History of asthma  The cough has been causing gagging  Recently started on a CPAP machine three weeks ago and it has bene difficult to use with congestion   Had COVID/flu and RSV testing that were all negative   Has been using Albuterol    Problems:  Patient Active Problem List   Diagnosis Date Noted   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   Borderline personality disorder (HCC) 09/21/2020   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   Labor and delivery indication for care or intervention 02/15/2017   Encounter for procreative genetic counseling 08/02/2016   Umbilical hernia without obstruction and without gangrene    ADHD (attention deficit hyperactivity disorder) 06/20/2016   Anxiety 06/20/2016   Asthma without status asthmaticus 06/20/2016   Depression 06/20/2016  Fibrocystic breast disease 06/20/2016   Fibromyalgia 06/20/2016   Migraine 06/20/2016   Ovarian cyst 06/20/2016   Morbid obesity (HCC) 02/24/2014   KNEE PAIN, BILATERAL 01/02/2010   TENDINITIS, PATELLAR 01/02/2010    Allergies:  Allergies  Allergen Reactions   Penicillins Anaphylaxis    Did it involve swelling of the face/tongue/throat, SOB, or low BP? Yes Did it involve sudden or severe  rash/hives, skin peeling, or any reaction on the inside of your mouth or nose? Unknown Did you need to seek medical attention at a hospital or doctor's office? Yes When did it last happen?       If all above answers are "NO", may proceed with cephalosporin use.    Remdesivir Hives and Itching    Hives all over body and severe itching  Hives all over body and severe itching  Hives all over body and severe itching     Chlorhexidine Gluconate Hives   Clonazepam     Indifference to life attitude   Dexamethasone Hives   Doxycycline Nausea And Vomiting    Severe vomiting   Fluoxetine Other (See Comments)    Suicidal thoughts   Nsaids     No nsaids due to gastric surgery   Hydrocodone-Acetaminophen  Hives and Rash   Tape Rash   Tuberculin Ppd Rash   Medications:  Current Outpatient Medications:    albuterol  (VENTOLIN  HFA) 108 (90 Base) MCG/ACT inhaler, Inhale 1-2 puffs into the lungs every 4 (four) hours as needed for shortness of breath or wheezing., Disp: , Rfl:    ALPRAZolam  (XANAX ) 0.5 MG tablet, Take 0.5 mg by mouth daily as needed for anxiety., Disp: , Rfl:    amphetamine -dextroamphetamine  (ADDERALL) 20 MG tablet, Take 20 mg by mouth 3 (three) times daily., Disp: , Rfl:    B Complex-C (SUPER B COMPLEX/VITAMIN C PO), Take 1 tablet by mouth daily. , Disp: , Rfl:    BD DISP NEEDLES 25G X 5/8 MISC, SMARTSIG:Injection Once a Week, Disp: , Rfl:    busPIRone  (BUSPAR ) 15 MG tablet, Take 15 mg by mouth 4 (four) times daily., Disp: , Rfl:    Calcium  Carbonate (CALCIUM  500 PO), Take 500 mg by mouth 4 (four) times daily. chewable, Disp: , Rfl:    CALCIUM  MAGNESIUM  ZINC PO, Take by mouth., Disp: , Rfl:    Cholecalciferol  (VITAMIN D3 PO), Take by mouth., Disp: , Rfl:    CREON  36000-114000 units CPEP capsule, Take 72,000 Units by mouth 3 (three) times daily with meals., Disp: , Rfl:    diclofenac Sodium (VOLTAREN) 1 % GEL, Apply topically 4 (four) times daily., Disp: , Rfl:    dronabinol  (MARINOL) 5 MG capsule, Take 5 mg by mouth 2 (two) times daily as needed (abdominal pain)., Disp: , Rfl:    fluvoxaMINE  (LUVOX ) 50 MG tablet, Take 50 mg by mouth 2 (two) times daily. Takes along with 25 to total 75 mg, Disp: , Rfl:    HYDROXYZINE HCL PO, Take by mouth., Disp: , Rfl:    Interferon Beta-1a (AVONEX IM), Inject 30 mcg into the muscle., Disp: , Rfl:    metoprolol  succinate (TOPROL -XL) 25 MG 24 hr tablet, Take 25 mg by mouth daily., Disp: , Rfl:    Multiple Vitamins-Minerals (ADEK GUMMIES PLUS ZN PO), Take 1 tablet by mouth daily., Disp: , Rfl:    ondansetron  (ZOFRAN -ODT) 4 MG disintegrating tablet, Take 4 mg by mouth every 8 (eight) hours as needed for vomiting or nausea., Disp: , Rfl:    OVER  THE COUNTER MEDICATION, Take 1 capsule by mouth daily. Lypoic acid, Disp: , Rfl:    pantoprazole  (PROTONIX ) 40 MG tablet, Take 40 mg by mouth daily., Disp: , Rfl:    promethazine -dextromethorphan (PROMETHAZINE -DM) 6.25-15 MG/5ML syrup, Take 5 mLs by mouth 3 (three) times daily as needed for cough., Disp: 118 mL, Rfl: 0   testosterone  cypionate (DEPOTESTOSTERONE CYPIONATE) 200 MG/ML injection, Inject into the skin every Saturday. Taken 0.65ml, Disp: , Rfl:    UNABLE TO FIND, Med Name: capaxone inj 3 times/wk, Disp: , Rfl:    vitamin E 1000 UNIT capsule, Take 1,000 Units by mouth daily., Disp: , Rfl:    zinc gluconate 50 MG tablet, Take 50 mg by mouth in the morning and at bedtime., Disp: , Rfl:    zolmitriptan (ZOMIG-ZMT) 2.5 MG disintegrating tablet, Take 2.5 mg by mouth daily as needed for migraine., Disp: , Rfl:   Observations/Objective: Patient is well-developed, well-nourished in no acute distress.  Resting comfortably  at home.  Head is normocephalic, atraumatic.  No labored breathing.  Speech is clear and coherent with logical content.  Patient is alert and oriented at baseline.    Assessment and Plan:  1. Bronchitis (Primary)  Continue albuterol  every 4-6 hours as needed for  wheezing   - promethazine -dextromethorphan (PROMETHAZINE -DM) 6.25-15 MG/5ML syrup; Take 5 mLs by mouth 4 (four) times daily as needed for up to 10 days for cough.  Dispense: 118 mL; Refill: 0  - azithromycin  (ZITHROMAX ) 250 MG tablet; Take 2 tablets on day 1, then 1 tablet daily on days 2 through 5  Dispense: 6 tablet; Refill: 0  - albuterol  (PROVENTIL ) (2.5 MG/3ML) 0.083% nebulizer solution; Take 3 mLs (2.5 mg total) by nebulization every 6 (six) hours as needed for wheezing or shortness of breath.  Dispense: 150 mL; Refill: 1       Follow Up Instructions: I discussed the assessment and treatment plan with the patient. The patient was provided an opportunity to ask questions and all were answered. The patient agreed with the plan and demonstrated an understanding of the instructions.  A copy of instructions were sent to the patient via MyChart unless otherwise noted below.    The patient was advised to call back or seek an in-person evaluation if the symptoms worsen or if the condition fails to improve as anticipated.    Lauraine Kitty, FNP

## 2023-03-17 ENCOUNTER — Ambulatory Visit
Admission: EM | Admit: 2023-03-17 | Discharge: 2023-03-17 | Disposition: A | Discharge status: Home / Self Care | Payer: Medicaid Other | Attending: Family Medicine | Admitting: Family Medicine

## 2023-03-17 ENCOUNTER — Ambulatory Visit (INDEPENDENT_AMBULATORY_CARE_PROVIDER_SITE_OTHER): Payer: Medicaid Other | Admitting: Radiology

## 2023-03-17 DIAGNOSIS — S91331A Puncture wound without foreign body, right foot, initial encounter: Secondary | ICD-10-CM | POA: Diagnosis not present

## 2023-03-17 DIAGNOSIS — Z23 Encounter for immunization: Secondary | ICD-10-CM

## 2023-03-17 MED ORDER — TETANUS-DIPHTH-ACELL PERTUSSIS 5-2.5-18.5 LF-MCG/0.5 IM SUSY
0.5000 mL | PREFILLED_SYRINGE | Freq: Once | INTRAMUSCULAR | Status: AC
  Administered 2023-03-17: 0.5 mL via INTRAMUSCULAR

## 2023-03-17 NOTE — Discharge Instructions (Signed)
  [facility]  Address: [address] [place], [place], [place] [address] Hours:   Phone: [phone]   The x-ray reading we discussed is preliminary. Your x-ray will be read by a radiologist in next few hours. If there is a discrepancy, you will be contacted, and instructed on a new plan for you care.

## 2023-03-17 NOTE — ED Triage Notes (Signed)
 Pt presents with right foot pain x 2 weeks. Pt states new floors were installed about two weeks ago in my home. I was walking around barefoot. I am not sure if there is something stuck in my foot. Pt currently rates right foot pain a 4/10. Pt cleaned area with alcohol and applied bacitracin with no improvement.

## 2023-03-17 NOTE — ED Provider Notes (Signed)
 [facility]    CSN: [id number] Arrival date & time: 03/17/23  1125      History   Chief Complaint Chief Complaint  Patient presents with   Foot Pain    HPI [name] is a 43 y.o. adult.    Foot Pain  Pain bottom of right foot, 2 weeks ago had a puncture wound on his foot ,unclear what caused it.  Since then has had some redness, drainage and pain with ambulation.  He is attempted to remove it at home without success.  Last tetanus shot greater than 5 years.  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   Labor and delivery indication for care or intervention 02/15/2017   Encounter for procreative genetic counseling 08/02/2016   Umbilical hernia without obstruction and without gangrene    ADHD (attention deficit hyperactivity disorder) 06/20/2016   Anxiety 06/20/2016   Asthma without status asthmaticus 06/20/2016   Depression 06/20/2016   Fibrocystic breast disease 06/20/2016   Fibromyalgia 06/20/2016   Migraine 06/20/2016   Ovarian cyst 06/20/2016   Morbid obesity (HCC) 02/24/2014   KNEE PAIN, BILATERAL 01/02/2010   TENDINITIS, PATELLAR 01/02/2010    Past Surgical History:  Procedure Laterality Date   BUNIONECTOMY Bilateral    COLONOSCOPY WITH PROPOFOL  N/A 08/19/2019   Procedure: COLONOSCOPY WITH PROPOFOL ;  Surgeon: Unk Corinn Skiff, MD;  Location: [facility] ENDOSCOPY;  Service: Gastroenterology;  Laterality: N/A;   ESOPHAGOGASTRODUODENOSCOPY (EGD) WITH PROPOFOL  N/A 08/19/2019   Procedure:  ESOPHAGOGASTRODUODENOSCOPY (EGD) WITH PROPOFOL ;  Surgeon: Unk Corinn Skiff, MD;  Location: [facility] ENDOSCOPY;  Service: Gastroenterology;  Laterality: N/A;   EYE SURGERY     blood clot in eye   HERNIA REPAIR  1991, 2001   inguinal and umbilical   LAPAROSCOPIC GASTRIC SLEEVE RESECTION  2015   TOTAL MASTECTOMY Bilateral     OB History     Gravida  2   Para      Term      Preterm      AB      Living  0      SAB  0   IAB      Ectopic      Multiple      Live Births               Home Medications    Prior to Admission medications   Medication Sig Start Date End Date Taking? Authorizing Provider  albuterol  (PROVENTIL ) (2.5 MG/3ML) 0.083% nebulizer solution Take 3 mLs (2.5 mg total) by nebulization every 6 (six) hours as needed for wheezing or shortness of breath. 03/11/23   Kennyth Domino, FNP  albuterol  (VENTOLIN  HFA) 108 (90 Base) MCG/ACT inhaler Inhale 1-2 puffs into the lungs every 4 (four) hours as needed for shortness of breath or wheezing.    [provider]  ALPRAZolam  (XANAX ) 0.5 MG tablet Take 0.5 mg by mouth daily as needed for anxiety. 02/09/21  [provider]  amphetamine -dextroamphetamine  (ADDERALL) 20 MG tablet Take 20 mg by mouth 3 (three) times daily. 03/07/21   [provider]  B Complex-C (SUPER B COMPLEX/VITAMIN C PO) Take 1 tablet by mouth daily.     [provider]  BD DISP NEEDLES 25G X 5/8 MISC SMARTSIG:Injection Once a Week 09/29/19   [provider]  busPIRone  (BUSPAR ) 15 MG tablet Take 15 mg by mouth 4 (four) times daily. 10/09/20   [provider]  Calcium  Carbonate (CALCIUM  500 PO) Take 500 mg by mouth 4 (four) times daily. chewable    [provider]  CALCIUM  MAGNESIUM  ZINC PO Take by mouth.    [provider]  Cholecalciferol  (VITAMIN D3 PO) Take by mouth.    [provider]  CREON  36000-114000 units CPEP capsule Take 72,000 Units by mouth 3 (three) times daily  with meals. 03/19/21   [provider]  diclofenac Sodium (VOLTAREN) 1 % GEL Apply topically 4 (four) times daily.    [provider]  dronabinol (MARINOL) 5 MG capsule Take 5 mg by mouth 2 (two) times daily as needed (abdominal pain). 06/24/19   [provider]  fluvoxaMINE  (LUVOX ) 50 MG tablet Take 50 mg by mouth 2 (two) times daily. Takes along with 25 to total 75 mg 03/20/21   [provider]  HYDROXYZINE HCL PO Take by mouth.    [provider]  Interferon Beta-1a (AVONEX IM) Inject 30 mcg into the muscle.    [provider]  metoprolol  succinate (TOPROL -XL) 25 MG 24 hr tablet Take 25 mg by mouth daily. 03/21/21   [provider]  Multiple Vitamins-Minerals (ADEK GUMMIES PLUS ZN PO) Take 1 tablet by mouth daily.    [provider]  ondansetron  (ZOFRAN -ODT) 4 MG disintegrating tablet Take 4 mg by mouth every 8 (eight) hours as needed for vomiting or nausea. 04/10/19   [provider]  OVER THE COUNTER MEDICATION Take 1 capsule by mouth daily. Lypoic acid    [provider]  pantoprazole  (PROTONIX ) 40 MG tablet Take 40 mg by mouth daily.    [provider]  promethazine -dextromethorphan (PROMETHAZINE -DM) 6.25-15 MG/5ML syrup Take 5 mLs by mouth 3 (three) times daily as needed for cough. 09/25/21   Raspet, Rocky POUR, PA-C  promethazine -dextromethorphan (PROMETHAZINE -DM) 6.25-15 MG/5ML syrup Take 5 mLs by mouth 4 (four) times daily as needed for up to 10 days for cough. 03/11/23 03/21/23  Kennyth Domino, FNP  testosterone  cypionate (DEPOTESTOSTERONE CYPIONATE) 200 MG/ML injection Inject into the skin every Saturday. Taken 0.13ml 09/29/19   [provider]  UNABLE TO FIND Med Name: capaxone inj 3 times/wk    [provider]  vitamin E 1000 UNIT capsule Take 1,000 Units by mouth daily.    [provider]  zinc gluconate 50 MG tablet Take 50 mg by mouth in the morning and at bedtime.     [provider]  zolmitriptan (ZOMIG-ZMT) 2.5 MG disintegrating tablet Take 2.5 mg by mouth daily as needed for migraine. 03/24/19   [provider]    Family History Family History  Problem Relation Age of Onset   Breast cancer Paternal Aunt    Breast cancer Paternal Aunt    Healthy Mother    CVA Father    Hypertension Father    Diabetes Father    Breast cancer Paternal Grandmother     Social History Social History   Tobacco Use   Smoking status: Former    Current  packs/day: 0.00    Types: Cigarettes    Quit date: 06/21/2003    Years since quitting: 19.7   Smokeless tobacco: Never  Vaping Use   Vaping status: Never Used  Substance Use Topics   Alcohol use: No   Drug use: No     Allergies   Penicillins, Remdesivir, Chlorhexidine gluconate, Clonazepam, Dexamethasone, Doxycycline, Fluoxetine, Nsaids, Hydrocodone-acetaminophen , Tape, and Tuberculin ppd   Review of Systems Review of Systems  Constitutional:  Negative for chills and fever.  Skin:  Positive for color change and wound.     Physical Exam Triage Vital Signs ED Triage Vitals  Encounter Vitals Group     BP 03/17/23 1132 128/75     Systolic BP Percentile --      Diastolic BP Percentile --      Pulse Rate 03/17/23 1132 99     Resp 03/17/23 1132 16     Temp 03/17/23 1132 98.3 F (36.8 C)     Temp Source 03/17/23 1132 Oral     SpO2 03/17/23 1132 97 %     Weight 03/17/23 1141 181 lb (82.1 kg)     Height 03/17/23 1141 5' 10 (1.778 m)     Head Circumference --      Peak Flow --      Pain Score 03/17/23 1141 4     Pain Loc --      Pain Education --      Exclude from Growth Chart --    No data found.  Updated Vital Signs BP 128/75 (BP Location: Right Arm)   Pulse 99   Temp 98.3 F (36.8 C) (Oral)   Resp 16   Ht 5' 10 (1.778 m)   Wt 181 lb (82.1 kg)   SpO2 97%   BMI 25.97 kg/m   Visual Acuity Right Eye Distance:   Left Eye Distance:   Bilateral Distance:    Right Eye  Near:   Left Eye Near:    Bilateral Near:     Physical Exam Vitals and nursing note reviewed.  Skin:    General: Skin is warm and dry.     Comments: Healing wound heel of right foot plantar surface minimal erythema, mild tenderness.  No edema, no induration, no active drainage  Neurological:     Mental Status: [name] is oriented to person, place, and time.  Psychiatric:        Mood and Affect: Mood normal.      UC Treatments / Results  Labs (all labs ordered are listed, but only abnormal results are displayed) Labs Reviewed - No data to display  EKG   Radiology No results found.  Procedures Procedures (including critical care time)  Medications Ordered in UC Medications  Tdap (BOOSTRIX) injection 0.5 mL (has no administration in time range)    Initial Impression / Assessment and Plan / UC Course  I have reviewed the triage vital signs and the nursing notes.  Pertinent labs & imaging results that were available during my care of the patient were reviewed by me and considered in my medical decision making (see chart for details).     43 year old male with puncture wound bottom of the right foot several weeks ago continues to have foreign body sensation and discomfort when he walks.  There is a healing wound with minimal erythema but no active drainage, x-ray independently viewed by me no foreign body noted.  Recommend warm soaks and follow-up with his podiatrist Final Clinical Impressions(s) / UC Diagnoses  Final diagnoses:  Puncture wound of right foot, initial encounter   Discharge Instructions   None    ED Prescriptions   None    PDMP not reviewed this encounter.   Jean Skow, [place] 03/17/23 1222

## 2023-04-07 ENCOUNTER — Ambulatory Visit
Admission: RE | Admit: 2023-04-07 | Discharge: 2023-04-07 | Disposition: A | Discharge status: Home / Self Care | Payer: 59 | Source: Ambulatory Visit | Attending: Physician Assistant | Admitting: Physician Assistant

## 2023-04-07 VITALS — BP 126/78 | HR 2 | Temp 97.8°F | Resp 17

## 2023-04-07 DIAGNOSIS — H6992 Unspecified Eustachian tube disorder, left ear: Secondary | ICD-10-CM | POA: Diagnosis not present

## 2023-04-07 NOTE — ED Provider Notes (Signed)
[name]    CSN: [id number] Arrival date & time: 04/07/23  1853      History   Chief Complaint Chief Complaint  Patient presents with   Ear Fullness    Left ear-Jet engine noise constantlyMuffled hearingSounds that can be heard are EXTREMELY loud (painful) - Entered by patient    HPI [name] is a 43 y.o. adult.   Patient presents to urgent care for evaluation of left ear pain and jet engine sounding noise to the left ear that started today.  He was recently sick with a viral upper respiratory infection and nasal congestion and is currently on a steroid Dosepak.  Denies decreased hearing from the left ear, recent fevers, chills, nausea, vomiting, and dizziness.  Steroid is not helping very much with ear pain or ear ringing.  Denies recent drainage from the ear canal or recent injuries to the ear.   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   Labor and delivery indication for care or intervention 02/15/2017   Encounter for procreative genetic counseling 08/02/2016   Umbilical hernia without obstruction and without gangrene    ADHD (attention deficit hyperactivity disorder) 06/20/2016   Anxiety 06/20/2016   Asthma without status asthmaticus 06/20/2016   Depression 06/20/2016   Fibrocystic breast disease 06/20/2016   Fibromyalgia 06/20/2016   Migraine 06/20/2016   Ovarian cyst 06/20/2016   Morbid obesity (HCC) 02/24/2014   KNEE PAIN, BILATERAL 01/02/2010   TENDINITIS,  PATELLAR 01/02/2010    Past Surgical History:  Procedure Laterality Date   BUNIONECTOMY Bilateral    COLONOSCOPY WITH PROPOFOL N/A 08/19/2019   Procedure: COLONOSCOPY WITH PROPOFOL;  Surgeon: Toney Reil, MD;  Location: [facility]COPY;  Service: Gastroenterology;  Laterality: N/A;   ESOPHAGOGASTRODUODENOSCOPY (EGD) WITH PROPOFOL N/A 08/19/2019   Procedure: ESOPHAGOGASTRODUODENOSCOPY (EGD) WITH PROPOFOL;  Surgeon: Toney Reil, MD;  Location: [facility] ENDOSCOPY;  Service: Gastroenterology;  Laterality: N/A;   EYE SURGERY     blood clot in eye   HERNIA REPAIR  1991, 2001   inguinal and umbilical   LAPAROSCOPIC GASTRIC SLEEVE RESECTION  2015   TOTAL MASTECTOMY Bilateral     OB History     Gravida  2   Para      Term      Preterm      AB      Living  0      SAB  0   IAB      Ectopic      Multiple      Live Births               Home Medications    Prior to Admission medications   Medication Sig Start Date End Date Taking? Authorizing Provider  albuterol (PROVENTIL) (2.5 MG/3ML) 0.083% nebulizer solution Take 3 mLs (2.5 mg total) by nebulization every 6 (six) hours as needed for wheezing or shortness of  breath. 03/11/23   Viviano Simas, FNP  albuterol (VENTOLIN HFA) 108 (90 Base) MCG/ACT inhaler Inhale 1-2 puffs into the lungs every 4 (four) hours as needed for shortness of breath or wheezing.    [provider]  ALPRAZolam Prudy Feeler) 0.5 MG tablet Take 0.5 mg by mouth daily as needed for anxiety. 02/09/21   [provider]  amphetamine-dextroamphetamine (ADDERALL) 20 MG tablet Take 20 mg by mouth 3 (three) times daily. 03/07/21   [provider]  B Complex-C (SUPER B COMPLEX/VITAMIN C PO) Take 1 tablet by mouth daily.     [provider]  BD DISP NEEDLES 25G X 5/8" MISC SMARTSIG:Injection Once a Week 09/29/19   [provider]  busPIRone (BUSPAR) 15 MG tablet Take 15 mg by mouth 4 (four) times daily. 10/09/20    [provider]  Calcium Carbonate (CALCIUM 500 PO) Take 500 mg by mouth 4 (four) times daily. chewable    [provider]  CALCIUM MAGNESIUM ZINC PO Take by mouth.    [provider]  Cholecalciferol (VITAMIN D3 PO) Take by mouth.    [provider]  CREON 36000-114000 units CPEP capsule Take 72,000 Units by mouth 3 (three) times daily with meals. 03/19/21   [provider]  diclofenac Sodium (VOLTAREN) 1 % GEL Apply topically 4 (four) times daily.    [provider]  dronabinol (MARINOL) 5 MG capsule Take 5 mg by mouth 2 (two) times daily as needed (abdominal pain). 06/24/19   [provider]  fluvoxaMINE (LUVOX) 50 MG tablet Take 50 mg by mouth 2 (two) times daily. Takes along with 25 to total 75 mg 03/20/21   [provider]  HYDROXYZINE HCL PO Take by mouth.    [provider]  Interferon Beta-1a (AVONEX IM) Inject 30 mcg into the muscle.    [provider]  metoprolol succinate (TOPROL-XL) 25 MG 24 hr tablet Take 25 mg by mouth daily. 03/21/21   [provider]  Multiple Vitamins-Minerals (ADEK GUMMIES PLUS ZN PO) Take 1 tablet by mouth daily.    [provider]  ondansetron (ZOFRAN-ODT) 4 MG disintegrating tablet Take 4 mg by mouth every 8 (eight) hours as needed for vomiting or nausea. 04/10/19   [provider]  OVER THE COUNTER MEDICATION Take 1 capsule by mouth daily. Lypoic acid    [provider]  pantoprazole (PROTONIX) 40 MG tablet Take 40 mg by mouth daily.    [provider]  promethazine-dextromethorphan (PROMETHAZINE-DM) 6.25-15 MG/5ML syrup Take 5 mLs by mouth 3 (three) times daily as needed for cough. 09/25/21   Raspet, Noberto Retort, PA-C  testosterone cypionate (DEPOTESTOSTERONE CYPIONATE) 200 MG/ML injection Inject into the skin every Saturday. Taken 0.34ml 09/29/19   [provider]  UNABLE TO FIND Med Name: capaxone inj 3 times/wk    [provider]  vitamin E 1000 UNIT capsule Take 1,000 Units by mouth daily.    [provider]  zinc gluconate 50 MG tablet Take 50 mg by mouth in the morning and at bedtime.    [provider]  zolmitriptan (ZOMIG-ZMT) 2.5 MG disintegrating tablet Take 2.5 mg by mouth daily as needed for migraine. 03/24/19   [provider]    Family History Family History  Problem Relation Age of Onset   Breast cancer Paternal Aunt    Breast cancer Paternal Aunt    Healthy Mother    CVA Father    Hypertension Father    Diabetes  Father    Breast cancer Paternal Grandmother     Social History Social History   Tobacco Use   Smoking status: Former    Current packs/day: 0.00    Types: Cigarettes    Quit date: 06/21/2003    Years since quitting: 19.8   Smokeless tobacco: Never  Vaping Use   Vaping status: Never Used  Substance Use Topics   Alcohol use: No   Drug use: No     Allergies   Penicillins, Remdesivir, Chlorhexidine gluconate, Clonazepam, Dexamethasone, Doxycycline, Fluoxetine, Nsaids, Hydrocodone-acetaminophen, Tape, and Tuberculin ppd   Review of Systems Review of Systems Per HPI  Physical Exam Triage Vital Signs ED Triage Vitals  Encounter Vitals Group     BP 04/07/23 1923 126/78     Systolic BP Percentile --      Diastolic BP Percentile --      Pulse Rate 04/07/23 1923 (!) 2     Resp 04/07/23 1923 17     Temp 04/07/23 1923 97.8 F (36.6 C)     Temp Source 04/07/23 1923 Oral     SpO2 04/07/23 1923 97 %     Weight --      Height --      Head Circumference --      Peak Flow --      Pain Score 04/07/23 1922 6     Pain Loc --      Pain Education --      Exclude from Growth Chart --    No data found.  Updated Vital Signs BP 126/78 (BP Location: Right Arm)   Pulse (!) 2   Temp 97.8 F (36.6 C) (Oral)   Resp 17   SpO2 97%   Visual Acuity Right Eye Distance:   Left Eye Distance:   Bilateral Distance:    Right Eye Near:   Left  Eye Near:    Bilateral Near:     Physical Exam Vitals and nursing note reviewed.  Constitutional:      Appearance: [name] is not ill-appearing or toxic-appearing.  HENT:     Head: Normocephalic and atraumatic.     Right Ear: Hearing, tympanic membrane, ear canal and external ear normal.     Left Ear: Hearing, ear canal and external ear normal. Tympanic membrane is bulging.     Nose: Nose normal.     Mouth/Throat:     Lips: Pink.     Mouth: Mucous membranes are moist. No injury or oral lesions.     Dentition: Normal dentition.     Tongue: No lesions.     Pharynx: Oropharynx is clear. Uvula midline. No pharyngeal swelling, oropharyngeal exudate, posterior oropharyngeal erythema, uvula swelling or postnasal drip.     Tonsils: No tonsillar exudate.  Eyes:     General: Lids are normal. Vision grossly intact. Gaze aligned appropriately.     Extraocular Movements: Extraocular movements intact.     Conjunctiva/sclera: Conjunctivae normal.  Neck:     Trachea: Trachea and phonation normal.  Cardiovascular:     Rate and Rhythm: Normal rate and regular rhythm.     Heart sounds: Normal heart sounds, S1 normal and S2 normal.  Pulmonary:     Effort: Pulmonary effort is normal. No respiratory distress.     Breath sounds: Normal breath sounds and air entry.  Musculoskeletal:     Cervical back: Neck supple.  Lymphadenopathy:     Cervical: No cervical adenopathy.  Skin:    General: Skin is warm and dry.  Capillary Refill: Capillary refill takes less than 2 seconds.     Findings: No rash.  Neurological:     General: No focal deficit present.     Mental Status: [name] is alert and oriented to person, place, and time. Mental status is at baseline.     Cranial Nerves: No dysarthria or facial asymmetry.  Psychiatric:        Mood and Affect: Mood normal.        Speech: Speech normal.        Behavior: Behavior normal.        Thought Content: Thought content normal.        Judgment: Judgment  normal.      UC Treatments / Results  Labs (all labs ordered are listed, but only abnormal results are displayed) Labs Reviewed - No data to display  EKG   Radiology No results found.  Procedures Procedures (including critical care time)  Medications Ordered in UC Medications - No data to display  Initial Impression / Assessment and Plan / UC Course  I have reviewed the triage vital signs and the nursing notes.  Pertinent labs & imaging results that were available during my care of the patient were reviewed by me and considered in my medical decision making (see chart for details).   1.  Eustachian tube dysfunction of left ear Presentation consistent with eustachian tube dysfunction.  No signs of otitis media, TMJ, injury.  Flonase and oral antihistamine recommended. He is currently taking prednisone which will also help with symptoms. Tylenol as needed for pain.   Counseled patient on potential for adverse effects with medications prescribed/recommended today, strict ER and return-to-clinic precautions discussed, patient verbalized understanding.    Final Clinical Impressions(s) / UC Diagnoses   Final diagnoses:  Dysfunction of left eustachian tube     Discharge Instructions      Your eustachian tubes are inflamed causing ear fullness/symptoms.   Flonase 2 puffs into each nostril daily.  Take oral antihistamine daily (choose one of the following: Claritin, allegra, zyrtec).   Tylenol as needed for pain.  If you develop any new or worsening symptoms or if your symptoms do not start to improve, please return here or follow-up with your primary care provider. If your symptoms are severe, please go to the emergency room.     ED Prescriptions   None    PDMP not reviewed this encounter.   Carlisle Beers, [place] 04/07/23 2027

## 2023-04-07 NOTE — Discharge Instructions (Addendum)
Your eustachian tubes are inflamed causing ear fullness/symptoms.   Flonase 2 puffs into each nostril daily.  Take oral antihistamine daily (choose one of the following: Claritin, allegra, zyrtec).   Tylenol as needed for pain.  If you develop any new or worsening symptoms or if your symptoms do not start to improve, please return here or follow-up with your primary care provider. If your symptoms are severe, please go to the emergency room.

## 2023-04-07 NOTE — ED Triage Notes (Signed)
Pt states they have MS and due to insurance change they have had lapse in meds. Was sick at first of month. Presents today with left ear pain, ear fullness, and jet like sound in ear

## 2023-04-18 ENCOUNTER — Other Ambulatory Visit: Payer: Self-pay

## 2023-04-18 DIAGNOSIS — J301 Allergic rhinitis due to pollen: Secondary | ICD-10-CM | POA: Insufficient documentation

## 2023-04-18 DIAGNOSIS — J4520 Mild intermittent asthma, uncomplicated: Secondary | ICD-10-CM | POA: Insufficient documentation

## 2023-04-18 DIAGNOSIS — L209 Atopic dermatitis, unspecified: Secondary | ICD-10-CM | POA: Insufficient documentation

## 2023-04-18 HISTORY — DX: Mild intermittent asthma, uncomplicated: J45.20

## 2023-04-18 HISTORY — DX: Atopic dermatitis, unspecified: L20.9

## 2023-04-18 HISTORY — DX: Allergic rhinitis due to pollen: J30.1

## 2023-04-18 NOTE — Progress Notes (Signed)
 Michael Canard, PA-C [address]  [address]  [place], [place] [address]  Main: [phone]  Fax: [phone]   Gastroenterology Consultation  Referring Provider:     Delmus Ferri, MD Primary Care Physician:  Delmus Ferri, MD Primary Gastroenterologist:  Michael Canard, PA-C / Dr. Ellis Guys   Reason for Consultation:   Constipation, Rectal Bleeding, Iron  Deficiency        HPI:   [name] is a 43 y.o. y/o adult referred for consultation & management  by Delmus Ferri, MD.    Medical history significant for multiple sclerosis, chronic fatigue, gastric sleeve resection in 2015.  Duodenal Switch in 2020.  Small bowel obstruction 03/2019.  No current treatment for constipation.  He takes MiraLAX sporadically as needed for constipation.  Takes Protonix  40 Mg daily for GERD.  Patient states his abdomen feels very bloated and swollen.  Has hard stools straining alternating with loose stools.  Has mild intermittent lower abdominal cramping.  No significant abdominal pain, nausea, or vomiting.  Had an episode of bright red rectal bleeding in January.  Currently has iron  deficiency anemia.  Cannot tolerate oral iron  which increases constipation.  Patient has been referred for IV iron .  Abdominal x-ray 2 weeks ago through PCP showed constipation but no bowel obstruction.  Last bowel movement this morning.  No more rectal bleeding at present.  03/2023 labs: Low iron  and ferritin.  Normal B12 and folate.  Mildly elevated alkaline phosphatase 152.  All other LFTs normal.  06/2022 labs Normal Hemoglobin 12.4.  12/2022 labs normal TSH.  08/2019 colonoscopy by Dr. Baldomero Bone: 2 small (3 mm and 5 mm) tubular adenoma polyps removed.  Good prep.  6-year repeat (due 08/2025).  08/2019 EGD: Vertical banded gastroplasty with normal pouch.  Otherwise normal.  Biopsies negative for H. pylori.  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   Postop check 07/12/2020   Prediabetes 01/01/2022   Small bowel obstruction (HCC) 03/2019   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    Past Surgical History:  Procedure Laterality Date   BUNIONECTOMY Bilateral    COLONOSCOPY WITH PROPOFOL  N/A 08/19/2019   Procedure: COLONOSCOPY WITH PROPOFOL ;  Surgeon: Selena Daily, MD;  Location: [facility] ENDOSCOPY;  Service: Gastroenterology;  Laterality: N/A;   ESOPHAGOGASTRODUODENOSCOPY (EGD) WITH PROPOFOL  N/A 08/19/2019   Procedure: ESOPHAGOGASTRODUODENOSCOPY (EGD) WITH PROPOFOL ;  Surgeon: Selena Daily, MD;  Location: [facility] ENDOSCOPY;  Service: Gastroenterology;  Laterality: N/A;   EYE SURGERY     blood clot in eye   GASTROPLASTY DUODENAL SWITCH  2020   HERNIA REPAIR  1991, 2001   inguinal and umbilical   LAPAROSCOPIC GASTRIC SLEEVE RESECTION  2015   TOTAL MASTECTOMY Bilateral     Prior to Admission medications   Medication Sig Start Date End Date Taking? Authorizing Provider  albuterol  (PROVENTIL ) (2.5 MG/3ML) 0.083% nebulizer solution Take 3 mLs (2.5 mg total) by nebulization  every 6 (six) hours as needed for wheezing or shortness of breath. 03/11/23  Yes Mardene Shake, FNP  albuterol  (VENTOLIN  HFA) 108 (90 Base) MCG/ACT inhaler Inhale 1-2 puffs into the lungs every 4 (four) hours as needed for shortness of breath or wheezing.   Yes [provider]  ALPRAZolam  (XANAX ) 0.5 MG tablet Take 0.5 mg by mouth daily as needed for anxiety. 02/09/21  Yes [provider]   amphetamine -dextroamphetamine  (ADDERALL) 20 MG tablet Take 20 mg by mouth 3 (three) times daily. 03/07/21  Yes [provider]  B Complex-C (SUPER B COMPLEX/VITAMIN C PO) Take 1 tablet by mouth daily.    Yes [provider]  busPIRone  (BUSPAR ) 15 MG tablet Take 15 mg by mouth 4 (four) times daily. 10/09/20  Yes [provider]  Calcium  Carbonate (CALCIUM  500 PO) Take 500 mg by mouth 4 (four) times daily. chewable   Yes [provider]  CALCIUM  MAGNESIUM  ZINC PO Take by mouth.   Yes [provider]  Cholecalciferol  (VITAMIN D3 PO) Take by mouth.   Yes [provider]  CREON  36000-114000 units CPEP capsule Take 72,000 Units by mouth 3 (three) times daily with meals. 03/19/21  Yes [provider]  diclofenac Sodium (VOLTAREN) 1 % GEL Apply topically 4 (four) times daily.   Yes [provider]  dronabinol (MARINOL) 5 MG capsule Take 5 mg by mouth 2 (two) times daily as needed (abdominal pain). 06/24/19  Yes [provider]  fluvoxaMINE  (LUVOX ) 50 MG tablet Take 50 mg by mouth 2 (two) times daily. Takes along with 25 to total 75 mg 03/20/21  Yes [provider]  HYDROXYZINE HCL PO Take by mouth.   Yes [provider]  Interferon Beta-1a (AVONEX IM) Inject 30 mcg into the muscle.   Yes [provider]  metoprolol  succinate (TOPROL -XL) 25 MG 24 hr tablet Take 25 mg by mouth daily. 03/21/21  Yes [provider]  Multiple Vitamins-Minerals (ADEK GUMMIES PLUS ZN PO) Take 1 tablet by mouth daily.   Yes [provider]  ondansetron  (ZOFRAN -ODT) 4 MG disintegrating tablet Take 4 mg by mouth every 8 (eight) hours as needed for vomiting or nausea. 04/10/19  Yes [provider]  OVER THE COUNTER MEDICATION Take 1 capsule by mouth daily. Lypoic acid   Yes [provider]  oxymetazoline (NASAL RELIEF) 0.05 % nasal spray Place into the nose.   Yes [provider]   pantoprazole  (PROTONIX ) 40 MG tablet Take 40 mg by mouth daily.   Yes [provider]  promethazine -dextromethorphan (PROMETHAZINE -DM) 6.25-15 MG/5ML syrup Take 5 mLs by mouth 3 (three) times daily as needed for cough. 09/25/21  Yes Raspet, Erin K, PA-C  testosterone  cypionate (DEPOTESTOSTERONE CYPIONATE) 200 MG/ML injection Inject into the skin every Saturday. Taken 0.29ml 09/29/19  Yes [provider]  vitamin E 1000 UNIT capsule Take 1,000 Units by mouth daily.   Yes [provider]  zinc gluconate 50 MG tablet Take 50 mg by mouth in the morning and at bedtime.   Yes [provider]  zolmitriptan (ZOMIG-ZMT) 2.5 MG disintegrating tablet Take 2.5 mg by mouth daily as needed for migraine. 03/24/19  Yes [provider]  BD DISP NEEDLES 25G X 5/8" MISC SMARTSIG:Injection Once a Week 09/29/19   [provider]  nirmatrelvir /ritonavir  (PAXLOVID , 300/100,) 20 x 150 MG & 10 x 100MG  TBPK Take by mouth. 09/23/21   [provider]  STATUS COVID-19/FLU A&B KIT TEST AS DIRECTED TODAY 03/07/23  [provider]  UNABLE TO FIND Med Name: capaxone inj 3 times/wk    [provider]     Family History  Problem Relation Age of Onset   Breast cancer Paternal Aunt    Breast cancer Paternal Aunt    Healthy Mother    CVA Father    Hypertension Father    Diabetes Father    Breast cancer Paternal Grandmother      Social History   Tobacco Use   Smoking status: Former    Current packs/day: 0.00    Types: Cigarettes    Quit date: 06/21/2003    Years since quitting: 19.8   Smokeless tobacco: Never  Vaping Use   Vaping status: Never Used  Substance Use Topics   Alcohol use: No   Drug use: No    Allergies as of 04/21/2023 - Review Complete 04/21/2023  Allergen Reaction Noted   Penicillins Anaphylaxis 12/09/2013   Remdesivir Hives and Itching 06/12/2020   Chlorhexidine gluconate Hives 11/06/2016   Clonazepam  02/17/2014    Dexamethasone Hives 02/05/2015   Doxycycline Nausea And Vomiting 12/09/2013   Fluoxetine Other (See Comments) 02/17/2014   Nsaids  05/14/2015   Hydrocodone-acetaminophen  Hives and Rash 02/17/2014   Tape Rash 02/17/2014   Tuberculin ppd Rash 02/17/2014    Review of Systems:    All systems reviewed and negative except where noted in HPI.   Physical Exam:  BP 128/86   Pulse 81   Temp 98.3 F (36.8 C)   Ht 5\' 10"  (1.778 m)   Wt 197 lb 9.6 oz (89.6 kg)   BMI 28.35 kg/m  No LMP recorded. (Menstrual status: IUD).  General:   Alert,  Well-developed, well-nourished, pleasant and cooperative in NAD Lungs:  Respirations even and unlabored.  Clear throughout to auscultation.   No wheezes, crackles, or rhonchi. No acute distress. Heart:  Regular rate and rhythm; no murmurs, clicks, rubs, or gallops. Abdomen:  Normal bowel sounds.  No bruits.  Soft, and distended without masses, hepatosplenomegaly or hernias noted.  Mild bilateral lower abdominal tenderness.  No upper abdominal tenderness. No guarding or rebound tenderness.    Neurologic:  Alert and oriented x3;  grossly normal neurologically. Psych:  Alert and cooperative. Normal mood and affect.  Imaging Studies: No results found.  Assessment and Plan:   RONNE MEZGER is a 42 y.o. y/o adult has been referred for:  Chronic Constipation  Gave samples of Linzess  72 mcg QD for 1 week, then 145 mcg QD for 1 week.  He will let me know which dose works best, and then can call back for a prescription.   Recommend High Fiber diet with fruits, vegetables, and whole grains. Drink 64 ounces of Fluids Daily.  Rectal Bleeding Scheduling Colonoscopy I discussed risks of colonoscopy with patient to include risk of bleeding, colon perforation, and risk of sedation.  Patient expressed understanding and agrees to proceed with colonoscopy.   Iron  Deficiency - Most Likely due to Bariatric Surgery.  He cannot tolerate oral Iron .  He is getting scheduled  for IV Iron  by his PCP.  History of Gastric Sleeve and Duodenal Switch Recommend general multivitamin daily.  PCP to monitor vitamin deficiencies and labs.  Follow up in 3 months with Dr. Colene Dauphin, PA-C

## 2023-04-21 ENCOUNTER — Encounter: Payer: Self-pay | Admitting: Physician Assistant

## 2023-04-21 ENCOUNTER — Ambulatory Visit (INDEPENDENT_AMBULATORY_CARE_PROVIDER_SITE_OTHER): Payer: 59 | Admitting: Physician Assistant

## 2023-04-21 VITALS — BP 128/86 | HR 81 | Temp 98.3°F | Ht 70.0 in | Wt 197.6 lb

## 2023-04-21 DIAGNOSIS — K5909 Other constipation: Secondary | ICD-10-CM | POA: Diagnosis not present

## 2023-04-21 DIAGNOSIS — K625 Hemorrhage of anus and rectum: Secondary | ICD-10-CM | POA: Diagnosis not present

## 2023-04-21 DIAGNOSIS — Z9884 Bariatric surgery status: Secondary | ICD-10-CM

## 2023-04-21 DIAGNOSIS — D509 Iron deficiency anemia, unspecified: Secondary | ICD-10-CM

## 2023-04-21 DIAGNOSIS — K5900 Constipation, unspecified: Secondary | ICD-10-CM

## 2023-04-21 DIAGNOSIS — E611 Iron deficiency: Secondary | ICD-10-CM

## 2023-04-21 MED ORDER — NA SULFATE-K SULFATE-MG SULF 17.5-3.13-1.6 GM/177ML PO SOLN: 1.0000 | Freq: Once | ORAL | 0 refills | Status: AC

## 2023-04-28 ENCOUNTER — Encounter: Payer: Self-pay | Admitting: Internal Medicine

## 2023-04-28 ENCOUNTER — Inpatient Hospital Stay: Payer: 59

## 2023-04-28 ENCOUNTER — Inpatient Hospital Stay: Payer: 59 | Attending: Internal Medicine | Admitting: Internal Medicine

## 2023-04-28 VITALS — BP 122/80 | HR 92 | Temp 98.9°F | Resp 18 | Wt 190.0 lb

## 2023-04-28 DIAGNOSIS — D509 Iron deficiency anemia, unspecified: Secondary | ICD-10-CM

## 2023-04-28 DIAGNOSIS — Z9884 Bariatric surgery status: Secondary | ICD-10-CM | POA: Diagnosis not present

## 2023-04-28 DIAGNOSIS — Z903 Acquired absence of stomach [part of]: Secondary | ICD-10-CM | POA: Insufficient documentation

## 2023-04-28 DIAGNOSIS — Z87891 Personal history of nicotine dependence: Secondary | ICD-10-CM | POA: Diagnosis not present

## 2023-04-28 LAB — CBC WITH DIFFERENTIAL (CANCER CENTER ONLY)
Abs Immature Granulocytes: 0.05 10*3/uL (ref 0.00–0.07)
Basophils Absolute: 0 10*3/uL (ref 0.0–0.1)
Basophils Relative: 0 %
Eosinophils Absolute: 0 10*3/uL (ref 0.0–0.5)
Eosinophils Relative: 0 %
HCT: 39.2 % (ref 39.0–52.0)
Hemoglobin: 12.2 g/dL — ABNORMAL LOW (ref 13.0–17.0)
Immature Granulocytes: 1 %
Lymphocytes Relative: 18 %
Lymphs Abs: 1.3 10*3/uL (ref 0.7–4.0)
MCH: 25.9 pg — ABNORMAL LOW (ref 26.0–34.0)
MCHC: 31.1 g/dL (ref 30.0–36.0)
MCV: 83.2 fL (ref 80.0–100.0)
Monocytes Absolute: 1.2 10*3/uL — ABNORMAL HIGH (ref 0.1–1.0)
Monocytes Relative: 17 %
Neutro Abs: 4.7 10*3/uL (ref 1.7–7.7)
Neutrophils Relative %: 64 %
Platelet Count: 290 10*3/uL (ref 150–400)
RBC: 4.71 MIL/uL (ref 4.22–5.81)
RDW: 14.6 % (ref 11.5–15.5)
WBC Count: 7.3 10*3/uL (ref 4.0–10.5)
nRBC: 0 % (ref 0.0–0.2)

## 2023-04-28 NOTE — Progress Notes (Signed)
[facility]  [phone] [phone]  ID: [name] OB: [birth year]  MR#: [id number]  AOZ#:[id number]  Patient Care Team: Michael Paradise, MD as PCP - General (Physician Assistant) Michael Paradise, MD as Referring Physician (Physician Assistant)  REFERRING PROVIDER: Jeannetta Ellis, MD  REASON FOR REFERRAL: IDA  HPI: [name] is a 43 y.o. adult with past medical history of anxiety, depression, history of sleeve gastrectomy 2015 with duodenal switch 2020, SBO in 2021, multiple sclerosis referred to hematology for iron deficiency anemia.  08/2019 colonoscopy by Dr. Allegra Schroeder: 2 small (3 mm and 5 mm) tubular adenoma polyps removed.  Good prep.  6-year repeat (due 08/2025).   08/2019 EGD: Vertical banded gastroplasty with normal pouch.  Otherwise normal.  Biopsies negative for H. pylori.  Labs reviewed.  From 03/27/2023.  Vitamin B12 736, folate 15.6, ferritin 5, saturation 6%.  CBC not available.  Could not tolerate oral iron due to constipation. Fatigued. Has narcolepsy. No bleeding in urine or stool. Constipation. Planned for colonoscopy for March 5th.   REVIEW OF SYSTEMS:   ROS  As per HPI. Otherwise, a complete review of systems is negative.  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   Postop check 07/12/2020   Prediabetes 01/01/2022   Small bowel obstruction (HCC) 03/2019   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     PAST SURGICAL HISTORY: Past Surgical History:  Procedure Laterality Date   BUNIONECTOMY Bilateral    COLONOSCOPY WITH PROPOFOL N/A 08/19/2019   Procedure: COLONOSCOPY WITH PROPOFOL;  Surgeon: Toney Reil, MD;  Location: [facility] ENDOSCOPY;  Service: Gastroenterology;  Laterality: N/A;   ESOPHAGOGASTRODUODENOSCOPY (EGD) WITH PROPOFOL N/A 08/19/2019   Procedure: ESOPHAGOGASTRODUODENOSCOPY (EGD) WITH PROPOFOL;  Surgeon: Toney Reil, MD;  Location: [facility] ENDOSCOPY;  Service: Gastroenterology;  Laterality: N/A;   EYE SURGERY     blood clot in eye   GASTROPLASTY DUODENAL SWITCH  2020   HERNIA REPAIR  1991, 2001   inguinal and umbilical   LAPAROSCOPIC GASTRIC SLEEVE RESECTION  2015   TOTAL MASTECTOMY Bilateral     FAMILY HISTORY: Family History  Problem Relation Age of Onset   Breast cancer Paternal Aunt    Breast cancer Paternal Aunt    Healthy Mother    CVA Father    Hypertension Father    Diabetes Father    Breast cancer Paternal Grandmother     HEALTH MAINTENANCE: Social History   Tobacco Use   Smoking status: Former    Current packs/day: 0.00    Types: Cigarettes    Quit date: 06/21/2003    Years since quitting: 19.8   Smokeless tobacco: Never  Vaping Use   Vaping status: Never Used  Substance Use Topics   Alcohol use: No   Drug use: No     Allergies  Allergen Reactions   Penicillins Anaphylaxis    Did it involve swelling of the  face/tongue/throat, SOB, or low BP? Yes Did it involve sudden or severe rash/hives, skin peeling, or any reaction on the inside of your mouth or nose? Unknown Did you need to seek medical attention at a hospital or doctor's office? Yes When did it last happen?       If all above answers are "NO", may proceed with cephalosporin use.    Remdesivir Hives and Itching    Hives all over body and severe itching  Hives all over body and severe itching  Hives all over body and severe itching     Chlorhexidine Gluconate Hives    Clonazepam     Indifference to life attitude   Dexamethasone Hives   Doxycycline Nausea And Vomiting    Severe vomiting   Fluoxetine Other (See Comments)    Suicidal thoughts   Nsaids     No nsaids due to gastric surgery   Hydrocodone-Acetaminophen Hives and Rash   Tape Rash   Tuberculin Ppd Rash    Current Outpatient Medications  Medication Sig Dispense Refill   albuterol (PROVENTIL) (2.5 MG/3ML) 0.083% nebulizer solution Take 3 mLs (2.5 mg total) by nebulization every 6 (six) hours as needed for wheezing or shortness of breath. 150 mL 1   albuterol (VENTOLIN HFA) 108 (90 Base) MCG/ACT inhaler Inhale 1-2 puffs into the lungs every 4 (four) hours as needed for shortness of breath or wheezing.     ALPRAZolam (XANAX) 0.5 MG tablet Take 0.5 mg by mouth daily as needed for anxiety.     amphetamine-dextroamphetamine (ADDERALL) 20 MG tablet Take 20 mg by mouth 3 (three) times daily.     B Complex-C (SUPER B COMPLEX/VITAMIN C PO) Take 1 tablet by mouth daily.      busPIRone (BUSPAR) 15 MG tablet Take 15 mg by mouth 4 (four) times daily.     Calcium Carbonate (CALCIUM 500 PO) Take 500 mg by mouth 4 (four) times daily. chewable     CALCIUM MAGNESIUM ZINC PO Take by mouth.     Cholecalciferol (VITAMIN D3 PO) Take by mouth.     CREON 36000-114000 units CPEP capsule Take 72,000 Units by mouth 3 (three) times daily with meals.     diclofenac Sodium (VOLTAREN) 1 % GEL Apply topically 4 (four) times daily.     dronabinol (MARINOL) 5 MG capsule Take 5 mg by mouth 2 (two) times daily as needed (abdominal pain).     fluvoxaMINE (LUVOX) 50 MG tablet Take 50 mg by mouth 2 (two) times daily. Takes along with 25 to total 75 mg     HYDROXYZINE HCL PO Take by mouth.     Interferon Beta-1a (AVONEX IM) Inject 30 mcg into the muscle.     metoprolol succinate (TOPROL-XL) 25 MG 24 hr tablet Take 25 mg by mouth daily.     Multiple Vitamins-Minerals (ADEK GUMMIES PLUS ZN PO) Take 1 tablet by mouth daily.      ondansetron (ZOFRAN-ODT) 4 MG disintegrating tablet Take 4 mg by mouth every 8 (eight) hours as needed for vomiting or nausea.     OVER THE COUNTER MEDICATION Take 1 capsule by mouth daily. Lypoic acid     oxymetazoline (NASAL RELIEF) 0.05 % nasal spray Place into the nose.     pantoprazole (PROTONIX) 40 MG tablet Take 40 mg by mouth daily.     promethazine-dextromethorphan (PROMETHAZINE-DM) 6.25-15 MG/5ML syrup Take 5 mLs by mouth 3 (three) times daily as needed for cough. 118 mL 0   testosterone cypionate (DEPOTESTOSTERONE CYPIONATE)  200 MG/ML injection Inject into the skin every Saturday. Taken 0.30ml     vitamin E 1000 UNIT capsule Take 1,000 Units by mouth daily.     zinc gluconate 50 MG tablet Take 50 mg by mouth in the morning and at bedtime.     zolmitriptan (ZOMIG-ZMT) 2.5 MG disintegrating tablet Take 2.5 mg by mouth daily as needed for migraine.     No current facility-administered medications for this visit.    OBJECTIVE: There were no vitals filed for this visit.   There is no height or weight on file to calculate BMI.      General: Well-developed, well-nourished, no acute distress. Eyes: Pink conjunctiva, anicteric sclera. HEENT: Normocephalic, moist mucous membranes, clear oropharnyx. Lungs: Clear to auscultation bilaterally. Heart: Regular rate and rhythm. No rubs, murmurs, or gallops. Abdomen: Soft, nontender, nondistended. No organomegaly noted, normoactive bowel sounds. Musculoskeletal: No edema, cyanosis, or clubbing. Neuro: Alert, answering all questions appropriately. Cranial nerves grossly intact. Skin: No rashes or petechiae noted. Psych: Normal affect. Lymphatics: No cervical, calvicular, axillary or inguinal LAD.   LAB RESULTS:  Lab Results  Component Value Date   NA 141 04/02/2021   K 4.2 04/02/2021   CL 106 04/02/2021   CO2 28 04/02/2021   GLUCOSE 102 (H) 04/02/2021   BUN <5 (L) 04/02/2021   CREATININE 0.94 04/02/2021   CALCIUM 8.4 (L) 04/02/2021    PROT 6.5 03/30/2021   ALBUMIN 3.5 03/30/2021   AST 19 03/30/2021   ALT 22 03/30/2021   ALKPHOS 80 03/30/2021   BILITOT 0.3 03/30/2021   GFRNONAA >60 04/02/2021   GFRAA >60 10/21/2019    Lab Results  Component Value Date   WBC 4.6 04/02/2021   NEUTROABS 2.7 03/30/2021   HGB 11.7 (L) 04/02/2021   HCT 38.1 (L) 04/02/2021   MCV 84.7 04/02/2021   PLT 269 04/02/2021    No results found for: "TIBC", "FERRITIN", "IRONPCTSAT"   STUDIES: No results found.  ASSESSMENT AND PLAN:   [name] is a 43 y.o. adult with past medical history of anxiety, depression, history of sleeve gastrectomy 2015 with duodenal switch 2020, multiple sclerosis referred to hematology for iron deficiency anemia.  # Iron deficiency anemia -Could be related to malabsorption from gastric sleeve.  Follows with GI.  Scheduled for repeat colonoscopy.  - 08/2019 colonoscopy by Dr. Allegra Schroeder: 2 small (3 mm and 5 mm) tubular adenoma polyps removed.  Good prep.  6-year repeat (due 08/2025). EGD: Vertical banded gastroplasty with normal pouch.  Otherwise normal.  Biopsies negative for H. pylori.  - Labs reviewed.  From 03/27/2023.  Vitamin B12 736, folate 15.6, ferritin 5, saturation 6%.  CBC not available.  Intolerant to oral iron due to constipation. -Discussed about IV Venofer 200 mg weekly x 5 doses.  Side effects such as occasional chest pain, back pain, nausea risk of allergic reaction was discussed.  Last CBC was from August 2024.  Recheck CBC today.    -   No orders of the defined types were placed in this encounter.   RTC in 4 months for MD visit, labs, possible Venofer  Patient expressed understanding and was in agreement with this plan. Holland also understands that Dayquan can call clinic at any time with any questions, concerns, or complaints.   I spent a total of 45 minutes reviewing chart data, face-to-face evaluation with the patient, counseling and coordination of care as detailed above.  Michaelyn Barter,  MD   04/28/2023 2:22 PM

## 2023-04-29 ENCOUNTER — Telehealth: Payer: Self-pay | Admitting: *Deleted

## 2023-04-29 NOTE — Telephone Encounter (Signed)
I called the pt and let him know that te venofer is the staff get a iv to vein and they push the venofer and there is not any Normal saline with it. He says that  in the past that he has  he has to take IVF because how weak he gets. While he is getting IV iron  he thinks fluids are needed

## 2023-04-30 ENCOUNTER — Telehealth: Payer: Self-pay

## 2023-04-30 NOTE — Telephone Encounter (Signed)
Clinical Social Work received referral from medical provider.  Attempted to contact patient.  Left a voicemail with contact information and requested a call back.

## 2023-05-06 ENCOUNTER — Inpatient Hospital Stay: Payer: 59

## 2023-05-06 ENCOUNTER — Encounter: Payer: Self-pay | Admitting: *Deleted

## 2023-05-06 ENCOUNTER — Telehealth: Payer: Self-pay | Admitting: *Deleted

## 2023-05-06 NOTE — Telephone Encounter (Signed)
 Attempted to reach patient 05/06/23 at 1333. Pt has not seen mychart msg. Vm left for patient informing that we would not be giving any additional Iv fluids.

## 2023-05-06 NOTE — Telephone Encounter (Signed)
 The pt states that he was trying to tell us that Tuesday is the only day he can not come-his daughter is having a procedure and he needs other days. Also he wants a call back and is  the MD going to give him IVF.

## 2023-05-08 ENCOUNTER — Other Ambulatory Visit: Payer: Self-pay

## 2023-05-08 ENCOUNTER — Inpatient Hospital Stay: Payer: 59

## 2023-05-08 VITALS — BP 124/80 | HR 82 | Temp 98.4°F | Resp 18

## 2023-05-08 DIAGNOSIS — D509 Iron deficiency anemia, unspecified: Secondary | ICD-10-CM

## 2023-05-08 MED ORDER — LINACLOTIDE 145 MCG PO CAPS: 145.0000 ug | ORAL_CAPSULE | Freq: Every day | ORAL | 11 refills | Status: AC

## 2023-05-08 MED ORDER — IRON SUCROSE 20 MG/ML IV SOLN
200.0000 mg | Freq: Once | INTRAVENOUS | Status: AC
  Administered 2023-05-08: 200 mg via INTRAVENOUS

## 2023-05-08 NOTE — Telephone Encounter (Signed)
 Pt came into the office reporting that the Linzess has been working well.... Rx sent through e-scribe, ok per progress note to send in what dose pt reports that is working

## 2023-05-08 NOTE — Patient Instructions (Signed)

## 2023-05-12 ENCOUNTER — Inpatient Hospital Stay: Payer: 59 | Attending: Internal Medicine

## 2023-05-12 VITALS — BP 116/78 | HR 87 | Temp 96.6°F | Resp 19

## 2023-05-12 DIAGNOSIS — D509 Iron deficiency anemia, unspecified: Secondary | ICD-10-CM | POA: Insufficient documentation

## 2023-05-12 MED ORDER — IRON SUCROSE 20 MG/ML IV SOLN
200.0000 mg | Freq: Once | INTRAVENOUS | Status: AC
  Administered 2023-05-12: 200 mg via INTRAVENOUS

## 2023-05-12 MED ORDER — SODIUM CHLORIDE 0.9% FLUSH
10.0000 mL | Freq: Once | INTRAVENOUS | Status: AC | PRN
  Administered 2023-05-12: 10 mL
  Filled 2023-05-12: qty 10

## 2023-05-13 ENCOUNTER — Encounter: Payer: Self-pay | Admitting: Gastroenterology

## 2023-05-14 ENCOUNTER — Ambulatory Visit
Admission: RE | Admit: 2023-05-14 | Discharge: 2023-05-14 | Disposition: A | Discharge status: Home / Self Care | Payer: 59 | Attending: Gastroenterology | Admitting: Gastroenterology

## 2023-05-14 ENCOUNTER — Telehealth: Payer: Self-pay

## 2023-05-14 ENCOUNTER — Encounter
Admission: RE | Disposition: A | Discharge status: Home / Self Care | Payer: Self-pay | Source: Home / Self Care | Attending: Gastroenterology

## 2023-05-14 ENCOUNTER — Ambulatory Visit: Admitting: Certified Registered Nurse Anesthetist

## 2023-05-14 DIAGNOSIS — Z538 Procedure and treatment not carried out for other reasons: Secondary | ICD-10-CM | POA: Insufficient documentation

## 2023-05-14 DIAGNOSIS — K625 Hemorrhage of anus and rectum: Secondary | ICD-10-CM | POA: Insufficient documentation

## 2023-05-14 HISTORY — PX: COLONOSCOPY WITH PROPOFOL: SHX5780

## 2023-05-14 SURGERY — COLONOSCOPY WITH PROPOFOL
Anesthesia: General

## 2023-05-14 NOTE — Telephone Encounter (Signed)
 I have got patient schedule for 07/01/2023 at 3:30

## 2023-05-14 NOTE — Telephone Encounter (Signed)
-----   Message from [facility] sent at 05/14/2023 12:27 PM EST ----- Regarding: Follow-up He was not clean, colonoscopy was canceled I can see him in the office to discuss further before rescheduling his colonoscopy Next available follow-up appointment is fine  RV

## 2023-05-14 NOTE — Progress Notes (Signed)
 Patient not clean will need to reschedule with a tow day prep

## 2023-05-15 ENCOUNTER — Inpatient Hospital Stay: Payer: 59

## 2023-05-15 ENCOUNTER — Encounter: Payer: Self-pay | Admitting: Gastroenterology

## 2023-05-16 DIAGNOSIS — E119 Type 2 diabetes mellitus without complications: Secondary | ICD-10-CM | POA: Insufficient documentation

## 2023-05-20 ENCOUNTER — Inpatient Hospital Stay: Payer: 59

## 2023-05-20 ENCOUNTER — Encounter: Payer: Self-pay | Admitting: Internal Medicine

## 2023-05-21 ENCOUNTER — Inpatient Hospital Stay

## 2023-05-21 VITALS — BP 127/79 | HR 84 | Temp 98.6°F | Resp 18

## 2023-05-21 DIAGNOSIS — D509 Iron deficiency anemia, unspecified: Secondary | ICD-10-CM

## 2023-05-21 MED ORDER — IRON SUCROSE 20 MG/ML IV SOLN
200.0000 mg | Freq: Once | INTRAVENOUS | Status: AC
Start: 2023-05-21 — End: 2023-05-21
  Administered 2023-05-21: 200 mg via INTRAVENOUS
  Filled 2023-05-21: qty 10

## 2023-05-21 MED ORDER — SODIUM CHLORIDE 0.9% FLUSH
10.0000 mL | Freq: Once | INTRAVENOUS | Status: AC | PRN
  Administered 2023-05-21: 10 mL
  Filled 2023-05-21: qty 10

## 2023-05-21 NOTE — Patient Instructions (Signed)

## 2023-05-21 NOTE — Progress Notes (Signed)
 Declined post-observation. Aware of risks. Vitals stable at discharge.

## 2023-05-23 ENCOUNTER — Inpatient Hospital Stay: Payer: 59

## 2023-05-23 VITALS — BP 129/84 | HR 72 | Temp 96.9°F | Resp 18

## 2023-05-23 DIAGNOSIS — D509 Iron deficiency anemia, unspecified: Secondary | ICD-10-CM | POA: Diagnosis not present

## 2023-05-23 MED ORDER — IRON SUCROSE 20 MG/ML IV SOLN
200.0000 mg | Freq: Once | INTRAVENOUS | Status: AC
  Administered 2023-05-23: 200 mg via INTRAVENOUS
  Filled 2023-05-23: qty 10

## 2023-05-23 MED ORDER — SODIUM CHLORIDE 0.9% FLUSH
10.0000 mL | Freq: Once | INTRAVENOUS | Status: AC | PRN
  Administered 2023-05-23: 10 mL
  Filled 2023-05-23: qty 10

## 2023-05-25 DIAGNOSIS — G2581 Restless legs syndrome: Secondary | ICD-10-CM | POA: Insufficient documentation

## 2023-05-26 ENCOUNTER — Inpatient Hospital Stay

## 2023-05-26 ENCOUNTER — Other Ambulatory Visit: Payer: Self-pay

## 2023-05-26 VITALS — BP 117/75 | HR 90 | Temp 97.1°F | Resp 16

## 2023-05-26 DIAGNOSIS — D509 Iron deficiency anemia, unspecified: Secondary | ICD-10-CM | POA: Diagnosis not present

## 2023-05-26 MED ORDER — IRON SUCROSE 20 MG/ML IV SOLN
200.0000 mg | Freq: Once | INTRAVENOUS | Status: AC
  Administered 2023-05-26: 200 mg via INTRAVENOUS
  Filled 2023-05-26: qty 10

## 2023-05-26 NOTE — Patient Instructions (Signed)

## 2023-05-27 ENCOUNTER — Ambulatory Visit (INDEPENDENT_AMBULATORY_CARE_PROVIDER_SITE_OTHER): Admitting: Gastroenterology

## 2023-05-27 ENCOUNTER — Encounter: Payer: Self-pay | Admitting: Gastroenterology

## 2023-05-27 ENCOUNTER — Ambulatory Visit: Attending: Orthopaedic Surgery | Admitting: Physical Therapy

## 2023-05-27 ENCOUNTER — Encounter: Payer: Self-pay | Admitting: Physical Therapy

## 2023-05-27 ENCOUNTER — Other Ambulatory Visit: Payer: Self-pay

## 2023-05-27 VITALS — BP 120/74 | HR 88 | Temp 97.7°F | Ht 70.0 in | Wt 187.0 lb

## 2023-05-27 DIAGNOSIS — M6281 Muscle weakness (generalized): Secondary | ICD-10-CM | POA: Diagnosis present

## 2023-05-27 DIAGNOSIS — M25561 Pain in right knee: Secondary | ICD-10-CM | POA: Diagnosis present

## 2023-05-27 DIAGNOSIS — K5904 Chronic idiopathic constipation: Secondary | ICD-10-CM

## 2023-05-27 DIAGNOSIS — R29898 Other symptoms and signs involving the musculoskeletal system: Secondary | ICD-10-CM | POA: Insufficient documentation

## 2023-05-27 DIAGNOSIS — K625 Hemorrhage of anus and rectum: Secondary | ICD-10-CM

## 2023-05-27 DIAGNOSIS — R278 Other lack of coordination: Secondary | ICD-10-CM | POA: Insufficient documentation

## 2023-05-27 DIAGNOSIS — K649 Unspecified hemorrhoids: Secondary | ICD-10-CM | POA: Diagnosis not present

## 2023-05-27 NOTE — Therapy (Unsigned)
 OUTPATIENT PHYSICAL THERAPY ORTHO EVALUATION   Patient Name: [name] MRN: [id number] DOB:[birth year], 43 y.o., adult Today's Date: 05/27/2023   PCP: Maurine Minister, MD REFERRING PROVIDER: Karleen Hampshire, MD   END OF SESSION:  PT End of Session - 05/27/23 0847     Visit Number 1    Number of Visits 24    Date for PT Re-Evaluation 08/19/23    PT Start Time 0847    PT Stop Time 0930    PT Time Calculation (min) 43 min             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   Postop check 07/12/2020   Prediabetes 01/01/2022   Small bowel obstruction (HCC) 03/2019   Sudden-onset sensorineural hearing loss    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   Past Surgical History:  Procedure Laterality Date   BUNIONECTOMY Bilateral    COLONOSCOPY WITH PROPOFOL N/A 08/19/2019   Procedure: COLONOSCOPY WITH PROPOFOL;  Surgeon: Toney Reil, MD;  Location: [facility] ENDOSCOPY;  Service: Gastroenterology;  Laterality: N/A;   COLONOSCOPY WITH PROPOFOL N/A 05/14/2023   Procedure: COLONOSCOPY WITH PROPOFOL;  Surgeon: Toney Reil, MD;  Location: [facility] ENDOSCOPY;  Service: Gastroenterology;  Laterality: N/A;   ESOPHAGOGASTRODUODENOSCOPY (EGD) WITH PROPOFOL N/A 08/19/2019   Procedure: ESOPHAGOGASTRODUODENOSCOPY (EGD) WITH PROPOFOL;  Surgeon: Toney Reil, MD;  Location: [facility] ENDOSCOPY;  Service: Gastroenterology;  Laterality: N/A;   EYE SURGERY     blood clot in eye   GASTROPLASTY  DUODENAL SWITCH  2020   HERNIA REPAIR  1991, 2001   inguinal and umbilical   LAPAROSCOPIC GASTRIC SLEEVE RESECTION  2015   TOTAL MASTECTOMY Bilateral    Patient Active Problem List   Diagnosis Date Noted   IDA (iron deficiency anemia) 04/28/2023   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   Labor and delivery indication for care or intervention 02/15/2017   Encounter for procreative genetic counseling 08/02/2016   Umbilical hernia without obstruction and without gangrene    ADHD (attention deficit hyperactivity disorder) 06/20/2016   Anxiety 06/20/2016   Asthma without status asthmaticus 06/20/2016   Depression 06/20/2016   Fibrocystic breast disease 06/20/2016   Fibromyalgia 06/20/2016   Migraine 06/20/2016   Ovarian cyst 06/20/2016   Morbid obesity (HCC) 02/24/2014   KNEE PAIN, BILATERAL 01/02/2010   TENDINITIS, PATELLAR 01/02/2010    ONSET DATE: February 2025  REFERRING DIAG:  Diagnosis  S83.521A (ICD-10-CM) - Sprain of posterior cruciate ligament of right knee, initial encounter     THERAPY DIAG:  Muscle weakness (generalized)  Weakness of right lower extremity  Other lack of coordination  Acute pain of right knee  Rationale for Evaluation and Treatment: Rehabilitation  SUBJECTIVE:  SUBJECTIVE STATEMENT:  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 States  that it  is continuing to "flare up". States extreme swelling in the R knee intermittently. Reports severe pain in the knee when pain edema is present. Also states "locking" in the knee, and reports that it will pop  start moving better.   Receive Cortisone shot in February and then 1 week later, 10cc of fluid was drained from knee.    Reports that pain is currently 6/10 dull, ache, unless bent then reports stabbing pain. States that pain was 10+/10 when swelling was the Worse.   Will be getting Fitting for new Donjoy knee stability brace in a few weeks. Is constantly wearing stability brace since February at Orthopedic MD recommendation.   States that they have had some, increased neurological s/s since increased swelling noted in the knee. Mostly, increased tone in the R UE digit flexor.    From prior PT episode:  Pt with lengthy subjective history of multiple complications associated with this evaluation.  Pt notes that they are currently having complication from Long Covid from the 3rd and last time they were diagnosed back in December 2022.  Pt notes that their aphasia has recently been difficult due to being diagnosed with RSV last week and ever since then, has had the aphasia and is much worse today.  Pt utilizing cell phone to communicate with therapist at initial portion of the session, however after anxiety levels lower, pt is able to communicate more effectively.  Pt notes that their speech is more difficult because their thoughts.word finding is "fuzzy" at this time.  Pt reports that they are tri-lingual and can speak [place], [place], and Sign.  Pt notes no complications with signing and a method of communicating more effectively.    Pt also confirms PCL rupture and is wearing custom DonJoy knee brace on the R LE to assist with stability as they have experienced a patellar dislocation as well.    Pt notes that during a spinal tap in January, the concluded that the pt would need to have an MRI as well and that is when they were diagnosed with MS.  Pt is coming to therapy for all complications listed above.  Pt accompanied by: self  PERTINENT HISTORY: see previous   PAIN: Are you having pain? Yes: NPRS scale: 4/10 Pain location: Feet and Legs, R>L Pain description: Tingling and Cold (Always Cold) Aggravating  factors: Walking, running, jumping Relieving factors: Heat  PRECAUTIONS: None  WEIGHT BEARING RESTRICTIONS: No  FALLS: Has patient fallen in last 6 months? No; last fall in April and broke L foot.  LIVING ENVIRONMENT: Lives with: lives with their family Lives in: House/apartment Stairs: Yes: Internal: 13 steps; on right going up Has following equipment at home:  DonJoy Custom R Knee Brace  PLOF: Independent  PATIENT GOALS: Strengthening due to losing a lot of atrophy.  Pt had weight loss surgery 2x.  Pt states the MD is wanting pt to increase the stability of the R knee.  Pt wants to be able to walk without pain.  OBJECTIVE:   DIAGNOSTIC FINDINGS:   EXAM: RIGHT KNEE - COMPLETE 4+ VIEW  IMPRESSION: 1. Moderate suprapatellar joint effusion. 2. Ossific density overlying the medial femoral condyle with overlying soft tissue swelling. In the setting of recent patellar dislocation findings may reflect an avulsion injury or osteochondral defect. Donor site is not apparent on the current imaging. Consider more definitive characterization with MRI or  CT of the right knee.   COGNITION: Overall cognitive status: Within functional limits for tasks assessed and Pt does have hx of aphasia at times.   SENSATION: WFL  COORDINATION: WFL  POSTURE: No Significant postural limitations  LOWER EXTREMITY MMT:    MMT Right Eval Left Eval  Hip flexion 4 4+  Hip extension 4+ 5  Hip abduction 4 4+  Hip adduction 4 4+  Hip internal rotation 4- 4  Hip external rotation 4- 4  Knee flexion 4-* 5  Knee extension 4* 5  Ankle dorsiflexion 4+ 4  Ankle plantarflexion    Ankle inversion    Ankle eversion    (Blank rows = not tested)   PROM Right Eval Left Eval  Hip flexion 115 120  Hip extension    Hip abduction 17 18  Hip adduction [facility]  Hip internal rotation [facility]  Hip external rotation [facility]  Knee flexion 110 135  Knee extension - lacking 4 0  Ankle dorsiflexion 18  22  Ankle plantarflexion    Ankle inversion    Ankle eversion     Palpation R knee Edema noted.  TTP at the medial and lateral joint line, medial>lateral. Crepitus noted with patallar mobs, but no pain. TP noted in rectus femoris and vastus medialis. Sensitive to palpation at medial HS insertion.   GAIT: Gait pattern: WFL Distance walked: 71' Assistive device utilized: None; DonJoy brace donned on the R LE for stability purposes Level of assistance: Complete Independence Comments: Stiffness noted in the R LE due to the brace, but otherwise WNL.  FUNCTIONAL TESTS:  5 times sit to stand: to be assessed  Timed up and go (TUG): Assess at next visit. 6 minute walk test: Assess at next visit.  PATIENT SURVEYS:  LEFS 19/80  TODAY'S TREATMENT: DATE: 05/27/23   TE Exercises - Supine Quad Set   - 10 reps - 2-3 sec  hold - Supine Short Arc Quad   - 10 reps - 2sec hold - Seated Knee Flexion Extension AROM   - 10 reps - 2-3 sec  hold   PATIENT EDUCATION: Education details: Pt educated on role of PT and services provided during current POC, along with prognosis and information about the clinic. Person educated: Patient Education method: Explanation Education comprehension: verbalized understanding  HOME EXERCISE PROGRAM:  Access Code: [email or web address] URL: [email or web address] Date: 05/27/2023 Prepared by: Grier Rocher  Exercises - Supine Quad Set  - 1 x daily - 5 x weekly - 3 sets - 10 reps - 2-3 sec  hold - Supine Short Arc Quad  - 1 x daily - 5 x weekly - 3 sets - 10 reps - 2sec hold - Seated Knee Flexion Extension AROM   - 1 x daily - 7 x weekly - 3 sets - 10 reps - 2-3 sec  hold   GOALS:  Goals reviewed with patient? Yes  SHORT TERM GOALS: Target date: 03/21/2022  Pt will be independent with HEP in order to demonstrate increased ability to perform tasks related to occupation/hobbies. Baseline: FOTO: Goal status: INITIAL   LONG TERM GOALS: Target date:  05/16/2022  1.  Patient will complete five times sit to stand test in < 10 seconds indicating an increased LE strength and improved balance. Baseline: to be assessed  Goal status: INITIAL  2.  Patient will increase LEFS score to equal to or greater than 29/80 to demonstrate statistically significant improvement in mobility and quality of life.  Baseline:19/80 Goal status:  INITIAL   3.  Patient will increase FGA score by > 4 points to demonstrate decreased fall risk during functional activities. Baseline: Not performed at initial evaluation. Goal status: INITIAL   4.  Patient will reduce timed up and go to <11 seconds to reduce fall risk and demonstrate improved transfer/gait ability. Baseline: Not performed at initial evaluation. Goal status: INITIAL  5.  Patient will increase 10 meter walk test to >1.33m/s as to improve gait speed for better community ambulation and to reduce fall risk. Baseline: Not performed at initial evaluation. Goal status: INITIAL  6.  Patient will increase six minute walk test distance to >1000 for progression to community ambulator and improve gait ability Baseline: Not performed at initial evaluation. Goal status: INITIAL    ASSESSMENT:  CLINICAL IMPRESSION: Patient is 43 y.o. seen today for physical therapy evaluation and treatment for knee pain with recent severe swelling. Pt reports decreased strength, ROM, and function as indicated on LEFS and assessment. Will continue to assess functional deficits as time allows and adjust goals accordingly. Pt will benefit from skilled therapy to address tolerance, balance, pain, and strength impairments necessary for improvement in quality of life.  Pt. demonstrates understanding of this plan of care and agrees with this plan      OBJECTIVE IMPAIRMENTS:  Abnormal gait, decreased activity tolerance, decreased balance, decreased endurance, decreased knowledge of condition, decreased mobility, difficulty walking,  decreased strength, dizziness, impaired sensation, and pain.   ACTIVITY LIMITATIONS: lifting, bending, sitting, standing, squatting, sleeping, stairs, and locomotion level  PARTICIPATION LIMITATIONS: cleaning, laundry, interpersonal relationship, community activity, and occupation  PERSONAL FACTORS: Age, Education, Past/current experiences, Time since onset of injury/illness/exacerbation, and 3+ comorbidities: MS, Depression, Fibromyalgia, Rupture of PCL  are also affecting patient's functional outcome.   REHAB POTENTIAL: Good  CLINICAL DECISION MAKING: Stable/uncomplicated  EVALUATION COMPLEXITY: High  PLAN:  PT FREQUENCY: 2x/week  PT DURATION: 12 weeks  PLANNED INTERVENTIONS: Therapeutic exercises, Therapeutic activity, Neuromuscular re-education, Balance training, Gait training, Patient/Family education, Self Care, Joint mobilization, Stair training, Vestibular training, Canalith repositioning, Dry Needling, Electrical stimulation, Spinal manipulation, Spinal mobilization, Cryotherapy, Moist heat, Traction, and Manual therapy  PLAN FOR NEXT SESSION:   Quad strengthening.   Perform balance related testing including but not limited to:  FGA 10 meter walk test TUG   Grier Rocher PT, DPT  Physical Therapist - [facility]  [facility]  11:59 AM 05/28/23

## 2023-05-27 NOTE — Progress Notes (Signed)
 Arlyss Repress, MD [address]  [address]  [place], [place] [address]  Main: [phone]  Fax: [phone]    Gastroenterology Consultation  Referring Provider:     Patrice Paradise, MD Primary Care Physician:  Patrice Paradise, MD Primary Gastroenterologist:  Dr. Arlyss Repress Reason for Consultation: Chronic idiopathic constipation, rectal bleeding        HPI:   [name] is a 43 y.o. adult referred by Dr. Merlinda Frederick, Merleen Milliner, MD  for consultation & management of chronic constipation and rectal bleeding.  He was septic from severe constipation within last 1 year, was eating f 4-5 mangoes daily which helped to move his bowels.  Patient states that since taking Linzess 145 mcg daily, he is no longer experiencing rectal pressure or discomfort or rectal bleeding.  He reports improvement in his appetite, able to drink water and eat well.  His weight has been stable.  He does not have any GI concerns today.  He does have chronic iron deficiency anemia secondary to duodenal switch for which he takes parenteral iron therapy as needed.  He could not tolerate oral iron due to constipation and gastritis.  He is taking bariatric multivitamin with minerals.  His hemoglobin is 12.  NSAIDs: None  Antiplts/Anticoagulants/Anti thrombotics: None  GI Procedures:  08/2019 colonoscopy by Dr. Allegra Lai: 2 small (3 mm and 5 mm) tubular adenoma polyps removed.  Good prep.  6-year repeat (due 08/2025).   08/2019 EGD: Vertical banded gastroplasty with normal pouch.  Otherwise normal.  Biopsies negative for H. pylori.  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   Postop check 07/12/2020   Prediabetes 01/01/2022   Small bowel obstruction (HCC) 03/2019   Sudden-onset sensorineural hearing loss    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    Past Surgical History:  Procedure Laterality Date   BUNIONECTOMY Bilateral    COLONOSCOPY WITH PROPOFOL N/A 08/19/2019   Procedure: COLONOSCOPY WITH PROPOFOL;  Surgeon: Toney Reil, MD;  Location: [facility] ENDOSCOPY;  Service: Gastroenterology;  Laterality: N/A;   COLONOSCOPY WITH PROPOFOL N/A 05/14/2023   Procedure: COLONOSCOPY WITH PROPOFOL;  Surgeon: Toney Reil, MD;  Location: [facility] ENDOSCOPY;  Service: Gastroenterology;  Laterality: N/A;   ESOPHAGOGASTRODUODENOSCOPY (EGD) WITH PROPOFOL N/A 08/19/2019   Procedure: ESOPHAGOGASTRODUODENOSCOPY (EGD) WITH PROPOFOL;  Surgeon: Toney Reil, MD;  Location: [facility] ENDOSCOPY;  Service: Gastroenterology;  Laterality: N/A;   EYE SURGERY     blood clot in eye   GASTROPLASTY DUODENAL SWITCH  2020   HERNIA REPAIR  1991, 2001   inguinal and umbilical   LAPAROSCOPIC GASTRIC SLEEVE RESECTION  2015   TOTAL MASTECTOMY Bilateral      Current Outpatient Medications:    albuterol (PROVENTIL) (2.5 MG/3ML) 0.083% nebulizer solution, Take 3 mLs (2.5 mg total) by nebulization every 6 (six) hours as needed for wheezing or shortness of breath., Disp: 150 mL, Rfl: 1  albuterol (VENTOLIN HFA) 108 (90 Base) MCG/ACT inhaler, Inhale 1-2 puffs into the lungs every 4 (four) hours as needed for shortness of breath or wheezing., Disp: , Rfl:    ALPRAZolam (XANAX) 0.5 MG tablet, Take 0.5 mg by mouth daily as needed for anxiety., Disp: , Rfl:    amitriptyline (ELAVIL) 25 MG tablet, Take 25 mg by mouth at bedtime., Disp: , Rfl:    amphetamine-dextroamphetamine (ADDERALL) 20 MG tablet, Take 20 mg by mouth 3 (three) times  daily., Disp: , Rfl:    B Complex-C (SUPER B COMPLEX/VITAMIN C PO), Take 1 tablet by mouth daily. , Disp: , Rfl:    busPIRone (BUSPAR) 15 MG tablet, Take 15 mg by mouth 4 (four) times daily., Disp: , Rfl:    Calcium Carbonate (CALCIUM 500 PO), Take 500 mg by mouth 4 (four) times daily. chewable, Disp: , Rfl:    CALCIUM MAGNESIUM ZINC PO, Take by mouth., Disp: , Rfl:    Cholecalciferol (VITAMIN D-1000 MAX ST) 25 MCG (1000 UT) tablet, Take by mouth daily., Disp: , Rfl:    clindamycin (CLEOCIN T) 1 % external solution, Apply 1 Application topically 2 (two) times daily., Disp: , Rfl:    CREON 36000-114000 units CPEP capsule, Take 72,000 Units by mouth 3 (three) times daily with meals., Disp: , Rfl:    diclofenac Sodium (VOLTAREN) 1 % GEL, Apply topically 4 (four) times daily., Disp: , Rfl:    dronabinol (MARINOL) 5 MG capsule, Take 5 mg by mouth 2 (two) times daily as needed (abdominal pain)., Disp: , Rfl:    famotidine (PEPCID) 40 MG tablet, Take 40 mg by mouth daily., Disp: , Rfl:    fluvoxaMINE (LUVOX) 50 MG tablet, Take 50 mg by mouth 2 (two) times daily. Takes along with 25 to total 75 mg, Disp: , Rfl:    hydrOXYzine (ATARAX) 25 MG tablet, Take 25 mg by mouth every 8 (eight) hours as needed., Disp: , Rfl:    Interferon Beta-1a (AVONEX IM), Inject 30 mcg into the muscle., Disp: , Rfl:    levocetirizine (XYZAL) 5 MG tablet, Take 5 mg by mouth every evening., Disp: , Rfl:    linaclotide (LINZESS) 145 MCG CAPS capsule, Take 1 capsule (145 mcg total) by mouth daily before breakfast., Disp: 30 capsule, Rfl: 11   metoprolol succinate (TOPROL-XL) 25 MG 24 hr tablet, Take 25 mg by mouth daily., Disp: , Rfl:    Multiple Vitamins-Minerals (ADEK GUMMIES PLUS ZN PO), Take 1 tablet by mouth daily., Disp: , Rfl:    ondansetron (ZOFRAN-ODT) 4 MG disintegrating tablet, Take 4 mg by mouth every 8 (eight) hours as needed for vomiting or nausea., Disp: , Rfl:    OVER THE COUNTER MEDICATION, Take 1 capsule by mouth  daily. Lypoic acid, Disp: , Rfl:    oxymetazoline (NASAL RELIEF) 0.05 % nasal spray, Place into the nose., Disp: , Rfl:    pantoprazole (PROTONIX) 40 MG tablet, Take 40 mg by mouth daily., Disp: , Rfl:    promethazine-dextromethorphan (PROMETHAZINE-DM) 6.25-15 MG/5ML syrup, Take 5 mLs by mouth 3 (three) times daily as needed for cough., Disp: 118 mL, Rfl: 0   testosterone cypionate (DEPOTESTOSTERONE CYPIONATE) 200 MG/ML injection, Inject into the skin every Saturday. Taken 0.29ml, Disp: , Rfl:    vitamin E 1000 UNIT capsule, Take 1,000 Units by mouth daily., Disp: , Rfl:    zinc gluconate 50 MG tablet, Take 50 mg by mouth in the morning and at bedtime., Disp: , Rfl:  zolmitriptan (ZOMIG-ZMT) 2.5 MG disintegrating tablet, Take 2.5 mg by mouth daily as needed for migraine., Disp: , Rfl:     Family History  Problem Relation Age of Onset   Breast cancer Paternal Aunt    Breast cancer Paternal Aunt    Healthy Mother    CVA Father    Hypertension Father    Diabetes Father    Breast cancer Paternal Grandmother      Social History   Tobacco Use   Smoking status: Former    Current packs/day: 0.00    Types: Cigarettes    Quit date: 06/21/2003    Years since quitting: 19.9   Smokeless tobacco: Never  Vaping Use   Vaping status: Never Used  Substance Use Topics   Alcohol use: No   Drug use: No    Allergies as of 05/27/2023 - Review Complete 05/27/2023  Allergen Reaction Noted   Penicillins Anaphylaxis 12/09/2013   Remdesivir Hives and Itching 06/12/2020   Azithromycin Other (See Comments) 05/02/2023   Chlorhexidine gluconate Hives 11/06/2016   Clonazepam  02/17/2014   Dexamethasone Hives 02/05/2015   Doxycycline Nausea And Vomiting 12/09/2013   Fluoxetine Other (See Comments) 02/17/2014   Nsaids  05/14/2015   Hydrocodone-acetaminophen Hives and Rash 02/17/2014   Tape Rash 02/17/2014   Tuberculin ppd Rash 02/17/2014    Review of Systems:    All systems reviewed and negative  except where noted in HPI.   Physical Exam:  BP 120/74 (BP Location: Left Arm, Patient Position: Sitting, Cuff Size: Normal)   Pulse 88   Temp 97.7 F (36.5 C) (Oral)   Ht 5\' 10"  (1.778 m)   Wt 187 lb (84.8 kg)   BMI 26.83 kg/m  No LMP recorded. (Menstrual status: IUD).  General:   Alert,  Well-developed, well-nourished, pleasant and cooperative in NAD Head:  Normocephalic and atraumatic. Eyes:  Sclera clear, no icterus.   Conjunctiva pink. Ears:  Normal auditory acuity. Nose:  No deformity, discharge, or lesions. Mouth:  No deformity or lesions,oropharynx pink & moist. Neck:  Supple; no masses or thyromegaly. Lungs:  Respirations even and unlabored.  Clear throughout to auscultation.   No wheezes, crackles, or rhonchi. No acute distress. Heart:  Regular rate and rhythm; no murmurs, clicks, rubs, or gallops. Abdomen:  Normal bowel sounds. Soft, non-tender and non-distended without masses, hepatosplenomegaly or hernias noted.  No guarding or rebound tenderness.   Rectal: Not performed Msk:  Symmetrical without gross deformities. Good, equal movement & strength bilaterally. Pulses:  Normal pulses noted. Extremities:  No clubbing or edema.  No cyanosis. Neurologic:  Alert and oriented x3;  grossly normal neurologically. Skin:  Intact without significant lesions or rashes. No jaundice. Psych:  Alert and cooperative. Normal mood and affect.  Imaging Studies: Reviewed  Assessment and Plan:   [name] is a 43 y.o. adult with history of morbid obesity, history of gastric sleeve, followed by duodenal switch in 02/2019, small bowel obstruction s/p ex lap with resection in 03/2019, dilation and Botox injection of pylorus, history of constipation and rectal bleeding  Chronic idiopathic constipation Resolved with Linzess 145 mcg daily, continue the same Reiterated on high-fiber diet, adequate intake of water  Rectal bleeding: Resolved, etiology secondary to hemorrhoids in setting of  severe constipation Colonoscopy in 08/2019 was normal   Follow up as needed   Arlyss Repress, MD

## 2023-05-28 ENCOUNTER — Inpatient Hospital Stay

## 2023-06-02 ENCOUNTER — Ambulatory Visit: Admitting: Physical Therapy

## 2023-06-03 ENCOUNTER — Ambulatory Visit: Admitting: Physical Therapy

## 2023-06-03 DIAGNOSIS — R29898 Other symptoms and signs involving the musculoskeletal system: Secondary | ICD-10-CM

## 2023-06-03 DIAGNOSIS — M6281 Muscle weakness (generalized): Secondary | ICD-10-CM | POA: Diagnosis not present

## 2023-06-03 DIAGNOSIS — M25561 Pain in right knee: Secondary | ICD-10-CM

## 2023-06-03 NOTE — Therapy (Signed)
 OUTPATIENT PHYSICAL THERAPY ORTHO TREATMENT   Patient Name: DORION PETILLO MRN: [id number] DOB:[birth year], 43 y.o., adult Today's Date: 06/03/2023   PCP: Maurine Minister, MD REFERRING PROVIDER: Karleen Hampshire, MD   END OF SESSION:  PT End of Session - 06/03/23 1622     Visit Number 2    Number of Visits 24    Date for PT Re-Evaluation 08/19/23    Progress Note Due on Visit 10    PT Start Time 1620    PT Stop Time 1658    PT Time Calculation (min) 38 min    Equipment Utilized During Treatment Gait belt    Activity Tolerance Patient tolerated treatment well;No increased pain    Behavior During Therapy [facility] for tasks assessed/performed              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   Postop check 07/12/2020   Prediabetes 01/01/2022   Small bowel obstruction (HCC) 03/2019   Sudden-onset sensorineural hearing loss    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   Past Surgical History:  Procedure Laterality Date   BUNIONECTOMY Bilateral    COLONOSCOPY WITH PROPOFOL N/A 08/19/2019   Procedure: COLONOSCOPY WITH PROPOFOL;  Surgeon: Toney Reil, MD;  Location: [facility] ENDOSCOPY;  Service: Gastroenterology;  Laterality: N/A;   COLONOSCOPY WITH PROPOFOL N/A 05/14/2023   Procedure: COLONOSCOPY WITH PROPOFOL;  Surgeon: Toney Reil, MD;  Location: [facility] ENDOSCOPY;  Service: Gastroenterology;  Laterality: N/A;   ESOPHAGOGASTRODUODENOSCOPY (EGD) WITH PROPOFOL N/A 08/19/2019    Procedure: ESOPHAGOGASTRODUODENOSCOPY (EGD) WITH PROPOFOL;  Surgeon: Toney Reil, MD;  Location: [facility] ENDOSCOPY;  Service: Gastroenterology;  Laterality: N/A;   EYE SURGERY     blood clot in eye   GASTROPLASTY DUODENAL SWITCH  2020   HERNIA REPAIR  1991, 2001   inguinal and umbilical   LAPAROSCOPIC GASTRIC SLEEVE RESECTION  2015   TOTAL MASTECTOMY Bilateral    Patient Active Problem List   Diagnosis Date Noted   Restless legs syndrome 05/25/2023   Type 2 diabetes mellitus without complication, without long-term current use of insulin (HCC) 05/16/2023   IDA (iron deficiency anemia) 04/28/2023   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   Labor and delivery indication for care or intervention 02/15/2017   Encounter for procreative genetic counseling 08/02/2016   Umbilical hernia without obstruction and without gangrene    ADHD (attention deficit hyperactivity disorder) 06/20/2016   Anxiety 06/20/2016   Asthma without status asthmaticus 06/20/2016   Depression 06/20/2016   Fibrocystic breast disease 06/20/2016   Fibromyalgia 06/20/2016   Migraine 06/20/2016   Ovarian cyst 06/20/2016   Morbid obesity (HCC) 02/24/2014   KNEE PAIN, BILATERAL 01/02/2010   TENDINITIS, PATELLAR 01/02/2010    ONSET DATE: February 2025  REFERRING DIAG:  Diagnosis  S83.521A (ICD-10-CM) - Sprain of posterior cruciate ligament of right knee, initial encounter  THERAPY DIAG:  Muscle weakness (generalized)  Weakness of right lower extremity  Acute pain of right knee  Rationale for Evaluation and Treatment: Rehabilitation  SUBJECTIVE:                                                                                                                                                                                             SUBJECTIVE STATEMENT:   Pt reports another incident where the knee popped and has increased  swelling.    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 States  that it  is continuing to "flare up". States extreme swelling in the R knee intermittently. Reports severe pain in the knee when pain edema is present. Also states "locking" in the knee, and reports that it will pop  start moving better.   Receive Cortisone shot in February and then 1 week later, 10cc of fluid was drained from knee.   Reports that pain is currently 6/10 dull, ache, unless bent then reports stabbing pain. States that pain was 10+/10 when swelling was the Worse.   Will be getting Fitting for new Donjoy knee stability brace in a few weeks. Is constantly wearing stability brace since February at Orthopedic MD recommendation.   States that they have had some, increased neurological s/s since increased swelling noted in the knee. Mostly, increased tone in the R UE digit flexor.    From prior PT episode:  Pt with lengthy subjective history of multiple complications associated with this evaluation.  Pt notes that they are currently having complication from Long Covid from the 3rd and last time they were diagnosed back in December 2022.  Pt notes that their aphasia has recently been difficult due to being diagnosed with RSV last week and ever since then, has had the aphasia and is much worse today.  Pt utilizing cell phone to communicate with therapist at initial portion of the session, however after anxiety levels lower, pt is able to communicate more effectively.  Pt notes that their speech is more difficult because their thoughts.word finding is "fuzzy" at this time.  Pt reports that they are tri-lingual and can speak [place], [place], and Sign.  Pt notes no complications with signing and a method of communicating more effectively.    Pt also confirms PCL rupture and is wearing custom DonJoy knee brace on the R LE to assist with stability as they have experienced a patellar dislocation as well.     Pt notes that during a spinal tap in January, the concluded that the pt would need to have an MRI as well  and that is when they were diagnosed with MS.  Pt is coming to therapy for all complications listed above.  Pt accompanied by: self  PERTINENT HISTORY: see previous   PAIN: Are you having pain? Yes: NPRS scale: 4/10 Pain location: Feet and Legs, R>L Pain description: Tingling and Cold (Always Cold) Aggravating factors: Walking, running, jumping Relieving factors: Heat  PRECAUTIONS: None  WEIGHT BEARING RESTRICTIONS: No  FALLS: Has patient fallen in last 6 months? No; last fall in April and broke L foot.  LIVING ENVIRONMENT: Lives with: lives with their family Lives in: House/apartment Stairs: Yes: Internal: 13 steps; on right going up Has following equipment at home:  DonJoy Custom R Knee Brace  PLOF: Independent  PATIENT GOALS: Strengthening due to losing a lot of atrophy.  Pt had weight loss surgery 2x.  Pt states the MD is wanting pt to increase the stability of the R knee.  Pt wants to be able to walk without pain.  OBJECTIVE:   DIAGNOSTIC FINDINGS:   EXAM: RIGHT KNEE - COMPLETE 4+ VIEW  IMPRESSION: 1. Moderate suprapatellar joint effusion. 2. Ossific density overlying the medial femoral condyle with overlying soft tissue swelling. In the setting of recent patellar dislocation findings may reflect an avulsion injury or osteochondral defect. Donor site is not apparent on the current imaging. Consider more definitive characterization with MRI or CT of the right knee.   COGNITION: Overall cognitive status: Within functional limits for tasks assessed and Pt does have hx of aphasia at times.   SENSATION: WFL  COORDINATION: WFL  POSTURE: No Significant postural limitations  LOWER EXTREMITY MMT:    MMT Right Eval Left Eval  Hip flexion 4 4+  Hip extension 4+ 5  Hip abduction 4 4+  Hip adduction 4 4+  Hip internal rotation 4- 4  Hip external  rotation 4- 4  Knee flexion 4-* 5  Knee extension 4* 5  Ankle dorsiflexion 4+ 4  Ankle plantarflexion    Ankle inversion    Ankle eversion    (Blank rows = not tested)   PROM Right Eval Left Eval  Hip flexion 115 120  Hip extension    Hip abduction 17 18  Hip adduction [facility]  Hip internal rotation [facility]  Hip external rotation [facility]  Knee flexion 110 135  Knee extension - lacking 4 0  Ankle dorsiflexion 18 22  Ankle plantarflexion    Ankle inversion    Ankle eversion     Palpation R knee Edema noted.  TTP at the medial and lateral joint line, medial>lateral. Crepitus noted with patallar mobs, but no pain. TP noted in rectus femoris and vastus medialis. Sensitive to palpation at medial HS insertion.   GAIT: Gait pattern: WFL Distance walked: 53' Assistive device utilized: None; DonJoy brace donned on the R LE for stability purposes Level of assistance: Complete Independence Comments: Stiffness noted in the R LE due to the brace, but otherwise WNL.  FUNCTIONAL TESTS:  5 times sit to stand: to be assessed  Timed up and go (TUG): Assess at next visit. 6 minute walk test: Assess at next visit.  PATIENT SURVEYS:  LEFS 19/80  TODAY'S TREATMENT: DATE: 06/03/23  PHYSICAL PERFORMANCE   [facility] PT Assessment - 06/03/23 0001       Balance   Balance Assessed Yes (P)       Functional Gait  Assessment   Gait assessed  Yes (P)     Gait Level Surface Walks 20 ft in  less than 7 sec but greater than 5.5 sec, uses assistive device, slower speed, mild gait deviations, or deviates 6-10 in outside of the 12 in walkway width. (P)     Change in Gait Speed Able to smoothly change walking speed without loss of balance or gait deviation. Deviate no more than 6 in outside of the 12 in walkway width. (P)     Gait with Horizontal Head Turns Performs head turns smoothly with slight change in gait velocity (eg, minor disruption to smooth gait path), deviates 6-10 in outside 12 in walkway  width, or uses an assistive device. (P)     Gait with Vertical Head Turns Performs head turns with no change in gait. Deviates no more than 6 in outside 12 in walkway width. (P)     Gait and Pivot Turn Pivot turns safely in greater than 3 sec and stops with no loss of balance, or pivot turns safely within 3 sec and stops with mild imbalance, requires small steps to catch balance. (P)     Step Over Obstacle Is able to step over one shoe box (4.5 in total height) without changing gait speed. No evidence of imbalance. (P)     Gait with Narrow Base of Support Ambulates less than 4 steps heel to toe or cannot perform without assistance. (P)     Gait with Eyes Closed Walks 20 ft, uses assistive device, slower speed, mild gait deviations, deviates 6-10 in outside 12 in walkway width. Ambulates 20 ft in less than 9 sec but greater than 7 sec. (P)     Ambulating Backwards Walks 20 ft, uses assistive device, slower speed, mild gait deviations, deviates 6-10 in outside 12 in walkway width. (P)     Steps Two feet to a stair, must use rail. (P)     Total Score 19 (P)               6 Min Walk Test:  Instructed patient to ambulate as quickly and as safely as possible for 6 minutes using LRAD. Patient was allowed to take standing rest breaks without stopping the test, but if the patient required a sitting rest break the clock would be stopped and the test would be over.  Results: 750 feet but had to stop at 3:40 due to pain Results indicate that the patient has reduced endurance with ambulation compared to age matched norms.  Age Matched Norms (in meters): 33-69 yo M: 72 F: 83, 12-79 yo M: 24 F: 471, 51-89 yo M: 417 F: 392 MDC: 58.21 meters (190.98 feet) or 50 meters (ANPTA Core Set of Outcome Measures for Adults with Neurologic Conditions, 2018)  PT instructed pt in TUG: 10.8 sec sec ( >13.5 sec indicates increased fall risk)  Self care:  Fitted with soft delta net stockinenette to decrease friction  from don-joy knee brace to alleviate pt complaint.    Manual:  IASTM and ischemic release of lastus lateralis, noticeable muscle twitching with along quad with this that lessened following intervention x 10 min    PATIENT EDUCATION: Education details: Pt educated on role of PT and services provided during current POC, along with prognosis and information about the clinic. Person educated: Patient Education method: Explanation Education comprehension: verbalized understanding  HOME EXERCISE PROGRAM:  Access Code: [email or web address] URL: [email or web address] Date: 05/27/2023 Prepared by: Grier Rocher  Exercises - Supine Quad Set  - 1 x daily - 5 x weekly - 3 sets - 10 reps - 2-3 sec  hold -  Supine Short Arc Quad  - 1 x daily - 5 x weekly - 3 sets - 10 reps - 2sec hold - Seated Knee Flexion Extension AROM   - 1 x daily - 7 x weekly - 3 sets - 10 reps - 2-3 sec  hold   GOALS:  Goals reviewed with patient? Yes  SHORT TERM GOALS: Target date: 03/21/2022  Pt will be independent with HEP in order to demonstrate increased ability to perform tasks related to occupation/hobbies. Baseline: FOTO: Goal status: INITIAL   LONG TERM GOALS: Target date: 05/16/2022  1.  Patient will complete five times sit to stand test in < 10 seconds indicating an increased LE strength and improved balance. Baseline: to be assessed  Goal status: INITIAL  2.  Patient will increase LEFS score to equal to or greater than 29/80 to demonstrate statistically significant improvement in mobility and quality of life.  Baseline:19/80 Goal status: INITIAL   3.  Patient will increase FGA score by > 4 points to demonstrate decreased fall risk during functional activities. Baseline: Not performed at initial evaluation. Goal status: INITIAL   4.  Patient will reduce timed up and go to <11 seconds to reduce fall risk and demonstrate improved transfer/gait ability. 10.8 sec Goal status: MET  5.  Patient will  increase 10 meter walk test to >1.71m/s as to improve gait speed for better community ambulation and to reduce fall risk. Baseline: Not performed at initial evaluation. Goal status: INITIAL  6.  Patient will increase six minute walk test distance to >1000 for progression to community ambulator and improve gait ability Baseline: Not performed at initial evaluation. Goal status: 750 ft in 3:40 sec, stopped due to pain > 7/10 and worsening.     ASSESSMENT:  CLINICAL IMPRESSION:  Patient arrived with good motivation for completion of PT activities.  Patient completed functional gait assessment and demonstrates impaired balance as result of current condition.  Patient demonstrated normal timed up and go and therefore met this goal.  Patient also demonstrates significant impairments as evidenced by 6-minute walk test showing decreased distance compared to normal 6-minute walk test with age group as well as decreased time of ambulation secondary to pain.  Patient responded well to manual therapy and was instructed to similar activities at home utilizing ice rolling stick patient has at home.Pt will continue to benefit from skilled physical therapy intervention to address impairments, improve QOL, and attain therapy goals.        OBJECTIVE IMPAIRMENTS:  Abnormal gait, decreased activity tolerance, decreased balance, decreased endurance, decreased knowledge of condition, decreased mobility, difficulty walking, decreased strength, dizziness, impaired sensation, and pain.   ACTIVITY LIMITATIONS: lifting, bending, sitting, standing, squatting, sleeping, stairs, and locomotion level  PARTICIPATION LIMITATIONS: cleaning, laundry, interpersonal relationship, community activity, and occupation  PERSONAL FACTORS: Age, Education, Past/current experiences, Time since onset of injury/illness/exacerbation, and 3+ comorbidities: MS, Depression, Fibromyalgia, Rupture of PCL  are also affecting patient's  functional outcome.   REHAB POTENTIAL: Good  CLINICAL DECISION MAKING: Stable/uncomplicated  EVALUATION COMPLEXITY: High  PLAN:  PT FREQUENCY: 2x/week  PT DURATION: 12 weeks  PLANNED INTERVENTIONS: Therapeutic exercises, Therapeutic activity, Neuromuscular re-education, Balance training, Gait training, Patient/Family education, Self Care, Joint mobilization, Stair training, Vestibular training, Canalith repositioning, Dry Needling, Electrical stimulation, Spinal manipulation, Spinal mobilization, Cryotherapy, Moist heat, Traction, and Manual therapy  PLAN FOR NEXT SESSION:   Quad strengthening. Manual therapy as indicated  Perform balance related testing including but not limited to:  Norman Herrlich  PT ,DPT Physical Therapist- [place]  [facility]   5:06 PM 06/03/23

## 2023-06-04 ENCOUNTER — Ambulatory Visit

## 2023-06-06 ENCOUNTER — Ambulatory Visit: Admitting: Physical Therapy

## 2023-06-06 DIAGNOSIS — R29898 Other symptoms and signs involving the musculoskeletal system: Secondary | ICD-10-CM

## 2023-06-06 DIAGNOSIS — M6281 Muscle weakness (generalized): Secondary | ICD-10-CM

## 2023-06-06 DIAGNOSIS — M25561 Pain in right knee: Secondary | ICD-10-CM

## 2023-06-09 ENCOUNTER — Ambulatory Visit

## 2023-06-11 ENCOUNTER — Ambulatory Visit

## 2023-06-12 ENCOUNTER — Ambulatory Visit
Admission: RE | Admit: 2023-06-12 | Discharge: 2023-06-12 | Disposition: A | Discharge status: Home / Self Care | Source: Ambulatory Visit | Attending: Internal Medicine | Admitting: Internal Medicine

## 2023-06-12 VITALS — BP 123/82 | HR 94 | Temp 98.0°F | Resp 17

## 2023-06-12 DIAGNOSIS — S61214A Laceration without foreign body of right ring finger without damage to nail, initial encounter: Secondary | ICD-10-CM

## 2023-06-12 NOTE — ED Triage Notes (Addendum)
 Pt presents with laceration on right ring finger near nailbed last night when reaching in to kitchen sink and cut finger

## 2023-06-12 NOTE — Discharge Instructions (Signed)
 Wound care: Please keep the area surrounding the wound/sutures clean and dry for the next 24 hours. After 24 hours, you may get the wound wet. Gently clean wound with antibacterial soap. Do not scrub wound. Cover the area with a nonstick bandage and change the bandage 2 times a day.   You should have the sutures removed in 10 days by your primary care provider or at urgent care. Return sooner than 10 days if you experience discharge from your laceration, redness around your laceration, warmth around your laceration, or fever.   You may take over the counter medicines as needed for aches and pains once the numbing wears off.   Thanks for letting me fix your cut today!

## 2023-06-12 NOTE — ED Provider Notes (Signed)
 [name]    CSN: [id number] Arrival date & time: 06/12/23  4098      History   Chief Complaint Chief Complaint  Patient presents with   Laceration    Right ring finger laceration near nail bed - Entered by patient    HPI [name] is a 43 y.o. adult.   [name] is a 43 y.o. adult presenting for chief complaint of laceration of the medial nailbed of the right ring finger that happened last night approximately 18 hours ago while washing dishes. He accidentally cut his finger on a knife. Nail is intact, wound bled significantly. He placed pressure dressing to the wound last night and presents for possible sutures today. Denies numbness/tingling distally to injury. Tdap updated 2 months ago in January 2025.      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   Postop check 07/12/2020   Prediabetes 01/01/2022   Small bowel obstruction (HCC) 03/2019   Sudden-onset sensorineural hearing loss    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    Patient Active Problem List   Diagnosis Date Noted   Restless legs syndrome 05/25/2023   Type 2 diabetes mellitus without complication, without long-term current use of insulin (HCC) 05/16/2023   IDA (iron deficiency anemia) 04/28/2023   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   Labor and delivery indication for care or intervention 02/15/2017   Encounter for  procreative genetic counseling 08/02/2016   Umbilical hernia without obstruction and without gangrene    ADHD (attention deficit hyperactivity disorder) 06/20/2016   Anxiety 06/20/2016   Asthma without status asthmaticus 06/20/2016   Depression 06/20/2016   Fibrocystic breast disease 06/20/2016   Fibromyalgia 06/20/2016   Migraine 06/20/2016   Ovarian cyst 06/20/2016   Morbid obesity (HCC) 02/24/2014   KNEE PAIN, BILATERAL 01/02/2010   TENDINITIS, PATELLAR 01/02/2010    Past Surgical History:  Procedure Laterality Date   BUNIONECTOMY Bilateral    COLONOSCOPY WITH PROPOFOL N/A 08/19/2019   Procedure: COLONOSCOPY WITH PROPOFOL;  Surgeon: Toney Reil, MD;  Location: [facility] ENDOSCOPY;  Service: Gastroenterology;  Laterality: N/A;   COLONOSCOPY WITH PROPOFOL N/A 05/14/2023   Procedure: COLONOSCOPY WITH PROPOFOL;  Surgeon: Toney Reil, MD;  Location: [facility] ENDOSCOPY;  Service: Gastroenterology;  Laterality: N/A;   ESOPHAGOGASTRODUODENOSCOPY (EGD) WITH PROPOFOL N/A 08/19/2019   Procedure: ESOPHAGOGASTRODUODENOSCOPY (EGD) WITH PROPOFOL;  Surgeon: Toney Reil, MD;  Location: [facility] ENDOSCOPY;  Service: Gastroenterology;  Laterality: N/A;   EYE SURGERY     blood clot in eye   GASTROPLASTY DUODENAL SWITCH  2020   HERNIA REPAIR  1991, 2001   inguinal and umbilical   LAPAROSCOPIC GASTRIC SLEEVE RESECTION  2015   TOTAL MASTECTOMY Bilateral     OB History     Gravida  2   Para      Term  Preterm      AB      Living  0      SAB  0   IAB      Ectopic      Multiple      Live Births               Home Medications    Prior to Admission medications   Medication Sig Start Date End Date Taking? Authorizing Provider  albuterol (PROVENTIL) (2.5 MG/3ML) 0.083% nebulizer solution Take 3 mLs (2.5 mg total) by nebulization every 6 (six) hours as needed for wheezing or shortness of breath. 03/11/23   Viviano Simas, FNP  albuterol (VENTOLIN HFA) 108 (90  Base) MCG/ACT inhaler Inhale 1-2 puffs into the lungs every 4 (four) hours as needed for shortness of breath or wheezing.    [provider]  ALPRAZolam Prudy Feeler) 0.5 MG tablet Take 0.5 mg by mouth daily as needed for anxiety. 02/09/21   [provider]  amitriptyline (ELAVIL) 25 MG tablet Take 25 mg by mouth at bedtime. 04/22/23   [provider]  amphetamine-dextroamphetamine (ADDERALL) 20 MG tablet Take 20 mg by mouth 3 (three) times daily. 03/07/21   [provider]  B Complex-C (SUPER B COMPLEX/VITAMIN C PO) Take 1 tablet by mouth daily.     [provider]  busPIRone (BUSPAR) 15 MG tablet Take 15 mg by mouth 4 (four) times daily. 10/09/20   [provider]  Calcium Carbonate (CALCIUM 500 PO) Take 500 mg by mouth 4 (four) times daily. chewable    [provider]  CALCIUM MAGNESIUM ZINC PO Take by mouth.    [provider]  Cholecalciferol (VITAMIN D-1000 MAX ST) 25 MCG (1000 UT) tablet Take by mouth daily.    [provider]  clindamycin (CLEOCIN T) 1 % external solution Apply 1 Application topically 2 (two) times daily. 05/05/23   [provider]  CREON 36000-114000 units CPEP capsule Take 72,000 Units by mouth 3 (three) times daily with meals. 03/19/21   [provider]  diclofenac Sodium (VOLTAREN) 1 % GEL Apply topically 4 (four) times daily.    [provider]  dronabinol (MARINOL) 5 MG capsule Take 5 mg by mouth 2 (two) times daily as needed (abdominal pain). 06/24/19   [provider]  famotidine (PEPCID) 40 MG tablet Take 40 mg by mouth daily. 12/17/22   [provider]  fluvoxaMINE (LUVOX) 50 MG tablet Take 50 mg by mouth 2 (two) times daily. Takes along with 25 to total 75 mg 03/20/21   [provider]  hydrOXYzine (ATARAX) 25 MG tablet Take 25 mg by mouth every 8 (eight) hours as needed.    [provider]  Interferon Beta-1a (AVONEX IM) Inject 30 mcg  into the muscle.    [provider]  levocetirizine (XYZAL) 5 MG tablet Take 5 mg by mouth every evening. 12/11/22   [provider]  linaclotide Karlene Einstein) 145 MCG CAPS capsule Take 1 capsule (145 mcg total) by mouth daily before breakfast. 05/08/23   Celso Amy, PA-C  metoprolol succinate (TOPROL-XL) 25 MG 24 hr tablet Take 25 mg by mouth daily. 03/21/21   [provider]  Multiple Vitamins-Minerals (ADEK GUMMIES PLUS ZN PO) Take 1 tablet by mouth daily.    [provider]  ondansetron (ZOFRAN-ODT) 4 MG disintegrating tablet Take 4 mg by mouth every 8 (eight) hours as needed for vomiting or nausea. 04/10/19   [provider]  OVER THE  COUNTER MEDICATION Take 1 capsule by mouth daily. Lypoic acid    [provider]  oxymetazoline (NASAL RELIEF) 0.05 % nasal spray Place into the nose.    [provider]  pantoprazole (PROTONIX) 40 MG tablet Take 40 mg by mouth daily.    [provider]  promethazine-dextromethorphan (PROMETHAZINE-DM) 6.25-15 MG/5ML syrup Take 5 mLs by mouth 3 (three) times daily as needed for cough. 09/25/21   Raspet, Noberto Retort, PA-C  testosterone cypionate (DEPOTESTOSTERONE CYPIONATE) 200 MG/ML injection Inject into the skin every Saturday. Taken 0.57ml 09/29/19   [provider]  vitamin E 1000 UNIT capsule Take 1,000 Units by mouth daily.    [provider]  zinc gluconate 50 MG tablet Take 50 mg by mouth in the morning and at bedtime.    [provider]  zolmitriptan (ZOMIG-ZMT) 2.5 MG disintegrating tablet Take 2.5 mg by mouth daily as needed for migraine. 03/24/19   [provider]    Family History Family History  Problem Relation Age of Onset   Breast cancer Paternal Aunt    Breast cancer Paternal Aunt    Healthy Mother    CVA Father    Hypertension Father    Diabetes Father    Breast cancer Paternal Grandmother     Social History Social History   Tobacco Use    Smoking status: Former    Current packs/day: 0.00    Types: Cigarettes    Quit date: 06/21/2003    Years since quitting: 19.9   Smokeless tobacco: Never  Vaping Use   Vaping status: Never Used  Substance Use Topics   Alcohol use: No   Drug use: No     Allergies   Penicillins, Remdesivir, Azithromycin, Chlorhexidine gluconate, Clonazepam, Dexamethasone, Doxycycline, Fluoxetine, Nsaids, Hydrocodone-acetaminophen, Tape, and Tuberculin ppd   Review of Systems Review of Systems Per HPI  Physical Exam Triage Vital Signs ED Triage Vitals [06/12/23 1003]  Encounter Vitals Group     BP 123/82     Systolic BP Percentile      Diastolic BP Percentile      Pulse Rate 94     Resp 17     Temp 98 F (36.7 C)     Temp src      SpO2 97 %     Weight      Height      Head Circumference      Peak Flow      Pain Score 3     Pain Loc      Pain Education      Exclude from Growth Chart    No data found.  Updated Vital Signs BP 123/82 (BP Location: Right Arm)   Pulse 94   Temp 98 F (36.7 C)   Resp 17   SpO2 97%   Visual Acuity Right Eye Distance:   Left Eye Distance:   Bilateral Distance:    Right Eye Near:   Left Eye Near:    Bilateral Near:     Physical Exam Vitals and nursing note reviewed.  Constitutional:      Appearance: [name] is not ill-appearing or toxic-appearing.  HENT:     Head: Normocephalic and atraumatic.     Right Ear: Hearing and external ear normal.     Left Ear: Hearing and external ear normal.     Nose: Nose normal.     Mouth/Throat:     Lips: Pink.  Eyes:     General: Lids are normal. Vision  grossly intact. Gaze aligned appropriately.     Extraocular Movements: Extraocular movements intact.     Conjunctiva/sclera: Conjunctivae normal.  Pulmonary:     Effort: Pulmonary effort is normal.  Musculoskeletal:     Right hand: Laceration (1cm laceration to the lateral nail fold of the distal right ring finger) present. No swelling, deformity,  tenderness or bony tenderness. Normal range of motion. Normal strength. Normal sensation. There is no disruption of two-point discrimination. Normal capillary refill (less than 2). Normal pulse (+2 right radial pulse).       Hands:     Cervical back: Neck supple.     Comments: Strength and sensation intact distally to laceration. Nail intact.   Skin:    General: Skin is warm and dry.     Capillary Refill: Capillary refill takes less than 2 seconds.     Findings: No rash.  Neurological:     General: No focal deficit present.     Mental Status: [name] is alert and oriented to person, place, and time. Mental status is at baseline.     Cranial Nerves: No dysarthria or facial asymmetry.  Psychiatric:        Mood and Affect: Mood normal.        Speech: Speech normal.        Behavior: Behavior normal.        Thought Content: Thought content normal.        Judgment: Judgment normal.    Laceration prior to repair   Laceration after repair   UC Treatments / Results  Labs (all labs ordered are listed, but only abnormal results are displayed) Labs Reviewed - No data to display  EKG   Radiology No results found.  Procedures Laceration Repair  Date/Time: 06/12/2023 10:51 AM  Performed by: Carlisle Beers, FNP Authorized by: Carlisle Beers, FNP   Consent:    Consent obtained:  Verbal   Consent given by:  Patient   Risks, benefits, and alternatives were discussed: yes     Risks discussed:  Infection, need for additional repair, nerve damage, poor wound healing, poor cosmetic result, pain, retained foreign body, tendon damage and vascular damage   Alternatives discussed:  No treatment Anesthesia:    Anesthesia method:  Local infiltration   Local anesthetic:  Lidocaine 1% w/o epi Laceration details:    Location:  Finger   Finger location:  R ring finger   Length (cm):  1   Depth (mm):  5 Exploration:    Wound extent: no foreign bodies/material noted   Treatment:     Area cleansed with:  Povidone-iodine Skin repair:    Repair method:  Sutures   Suture size:  5-0   Suture material:  Nylon   Suture technique:  Simple interrupted   Number of sutures:  2 Approximation:    Approximation:  Close Repair type:    Repair type:  Simple Post-procedure details:    Dressing:  Non-adherent dressing   Procedure completion:  Tolerated well, no immediate complications  (including critical care time)  Medications Ordered in UC Medications - No data to display  Initial Impression / Assessment and Plan / UC Course  I have reviewed the triage vital signs and the nursing notes.  Pertinent labs & imaging results that were available during my care of the patient were reviewed by me and considered in my medical decision making (see chart for details).   1. Laceration of right ring finger without foreign body without damage to  nail Laceration repaired, see procedure note above for details. Discussed wound care and cleaning at home.  Imaging: deferred given low suspicion for acute bony abnormality.  Infection return precautions discussed.  Suture removal in 10 days.  Tdap already up to date (2 months ago in January 2025).  Tylenol as needed for pain at home.  Advised to rest and avoid activities that may increase tension to wound/sutures or expose wound to infection. Excuse note given.   Counseled patient on potential for adverse effects with medications prescribed/recommended today, strict ER and return-to-clinic precautions discussed, patient verbalized understanding.    Final Clinical Impressions(s) / UC Diagnoses   Final diagnoses:  Laceration of right ring finger without foreign body without damage to nail, initial encounter     Discharge Instructions      Wound care: Please keep the area surrounding the wound/sutures clean and dry for the next 24 hours. After 24 hours, you may get the wound wet. Gently clean wound with antibacterial soap. Do not  scrub wound. Cover the area with a nonstick bandage and change the bandage 2 times a day.   You should have the sutures removed in 10 days by your primary care provider or at urgent care. Return sooner than 10 days if you experience discharge from your laceration, redness around your laceration, warmth around your laceration, or fever.   You may take over the counter medicines as needed for aches and pains once the numbing wears off.   Thanks for letting me fix your cut today!     ED Prescriptions   None    PDMP not reviewed this encounter.   Carlisle Beers, [place] 06/12/23 1053

## 2023-06-22 ENCOUNTER — Other Ambulatory Visit: Payer: Self-pay

## 2023-06-22 ENCOUNTER — Encounter (HOSPITAL_COMMUNITY): Payer: Self-pay | Admitting: *Deleted

## 2023-06-22 ENCOUNTER — Ambulatory Visit (HOSPITAL_COMMUNITY): Admission: EM | Admit: 2023-06-22 | Discharge: 2023-06-22 | Disposition: A | Discharge status: Home / Self Care

## 2023-06-22 NOTE — ED Triage Notes (Addendum)
 Pt reports sutures were placed at [facility]. PT here for suture removal

## 2023-06-22 NOTE — ED Notes (Signed)
 Removal of 2 sutures from Rt finger.

## 2023-06-22 NOTE — ED Notes (Signed)
 Sutures placed at [facility] per PT.

## 2023-06-24 ENCOUNTER — Encounter

## 2023-06-29 ENCOUNTER — Ambulatory Visit (HOSPITAL_COMMUNITY)
Admission: EM | Admit: 2023-06-29 | Discharge: 2023-06-29 | Disposition: A | Discharge status: Home / Self Care | Attending: Physician Assistant | Admitting: Physician Assistant

## 2023-06-29 ENCOUNTER — Encounter (HOSPITAL_COMMUNITY): Payer: Self-pay

## 2023-06-29 DIAGNOSIS — J069 Acute upper respiratory infection, unspecified: Secondary | ICD-10-CM | POA: Diagnosis not present

## 2023-06-29 DIAGNOSIS — B370 Candidal stomatitis: Secondary | ICD-10-CM

## 2023-06-29 LAB — POC COVID19/FLU A&B COMBO
Covid Antigen, POC: NEGATIVE
Influenza A Antigen, POC: NEGATIVE
Influenza B Antigen, POC: NEGATIVE

## 2023-06-29 MED ORDER — NYSTATIN 100000 UNIT/ML MT SUSP: 500000.0000 [IU] | Freq: Four times a day (QID) | OROMUCOSAL | 0 refills | Status: AC

## 2023-06-29 NOTE — ED Provider Notes (Signed)
 [facility]    CSN: [id number] Arrival date & time: 06/29/23  1658      History   Chief Complaint Chief Complaint  Patient presents with   Cough    HPI [name] is a 43 y.o. adult.   Patient here today for evaluation of cough, congestion, fever, chills, sweats and bodyaches that started yesterday.  His children are here with similar symptoms but no fever.  He notes he has been taking daily allergy medication.  He also reports a white coating to his tongue and is concerned for possible thrush.  He denies recent antibiotic use.  The history is provided by the patient.    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   Postop check 07/12/2020   Prediabetes 01/01/2022   Small bowel obstruction (HCC) 03/2019   Sudden-onset sensorineural hearing loss    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    Patient Active Problem List   Diagnosis Date Noted   Restless legs syndrome 05/25/2023   Type 2 diabetes mellitus without complication, without long-term current use of insulin (HCC) 05/16/2023   IDA (iron  deficiency anemia) 04/28/2023   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   Labor and delivery indication for care or intervention 02/15/2017   Encounter for procreative genetic counseling 08/02/2016   Umbilical hernia without obstruction and without gangrene    ADHD (attention deficit hyperactivity disorder)  06/20/2016   Anxiety 06/20/2016   Asthma without status asthmaticus 06/20/2016   Depression 06/20/2016   Fibrocystic breast disease 06/20/2016   Fibromyalgia 06/20/2016   Migraine 06/20/2016   Ovarian cyst 06/20/2016   Morbid obesity (HCC) 02/24/2014   KNEE PAIN, BILATERAL 01/02/2010   TENDINITIS, PATELLAR 01/02/2010    Past Surgical History:  Procedure Laterality Date   BUNIONECTOMY Bilateral    COLONOSCOPY WITH PROPOFOL  N/A 08/19/2019   Procedure: COLONOSCOPY WITH PROPOFOL ;  Surgeon: Selena Daily, MD;  Location: [facility] ENDOSCOPY;  Service: Gastroenterology;  Laterality: N/A;   COLONOSCOPY WITH PROPOFOL  N/A 05/14/2023   Procedure: COLONOSCOPY WITH PROPOFOL ;  Surgeon: Selena Daily, MD;  Location: [facility] ENDOSCOPY;  Service: Gastroenterology;  Laterality: N/A;   ESOPHAGOGASTRODUODENOSCOPY (EGD) WITH PROPOFOL  N/A 08/19/2019   Procedure: ESOPHAGOGASTRODUODENOSCOPY (EGD) WITH PROPOFOL ;  Surgeon: Selena Daily, MD;  Location: [facility] ENDOSCOPY;  Service: Gastroenterology;  Laterality: N/A;   EYE SURGERY     blood clot in eye   GASTROPLASTY DUODENAL SWITCH  2020   HERNIA REPAIR  1991, 2001   inguinal and umbilical   LAPAROSCOPIC GASTRIC SLEEVE RESECTION  2015   TOTAL MASTECTOMY Bilateral     OB History     Gravida  2   Para      Term      Preterm      AB      Living  0      SAB  0  IAB      Ectopic      Multiple      Live Births               Home Medications    Prior to Admission medications   Medication Sig Start Date End Date Taking? Authorizing Provider  nystatin  (MYCOSTATIN ) 100000 UNIT/ML suspension Use as directed 5 mLs (500,000 Units total) in the mouth or throat 4 (four) times daily for 7 days. 06/29/23 07/06/23 Yes Vernestine Gondola, PA-C  albuterol  (PROVENTIL ) (2.5 MG/3ML) 0.083% nebulizer solution Take 3 mLs (2.5 mg total) by nebulization every 6 (six) hours as needed for wheezing or shortness of breath. 03/11/23   Mardene Shake, FNP   albuterol  (VENTOLIN  HFA) 108 (90 Base) MCG/ACT inhaler Inhale 1-2 puffs into the lungs every 4 (four) hours as needed for shortness of breath or wheezing.    [provider]  ALPRAZolam  (XANAX ) 0.5 MG tablet Take 0.5 mg by mouth daily as needed for anxiety. 02/09/21   [provider]  amitriptyline  (ELAVIL ) 25 MG tablet Take 25 mg by mouth at bedtime. 04/22/23   [provider]  amphetamine -dextroamphetamine  (ADDERALL) 20 MG tablet Take 20 mg by mouth 3 (three) times daily. 03/07/21   [provider]  B Complex-C (SUPER B COMPLEX/VITAMIN C PO) Take 1 tablet by mouth daily.     [provider]  busPIRone  (BUSPAR ) 15 MG tablet Take 15 mg by mouth 4 (four) times daily. 10/09/20   [provider]  Calcium  Carbonate (CALCIUM  500 PO) Take 500 mg by mouth 4 (four) times daily. chewable    [provider]  CALCIUM  MAGNESIUM  ZINC PO Take by mouth.    [provider]  Cholecalciferol  (VITAMIN D -1000 MAX ST) 25 MCG (1000 UT) tablet Take by mouth daily.    [provider]  clindamycin (CLEOCIN T) 1 % external solution Apply 1 Application topically 2 (two) times daily. 05/05/23   [provider]  CREON  36000-114000 units CPEP capsule Take 72,000 Units by mouth 3 (three) times daily with meals. 03/19/21   [provider]  diclofenac Sodium (VOLTAREN) 1 % GEL Apply topically 4 (four) times daily.    [provider]  dronabinol (MARINOL) 5 MG capsule Take 5 mg by mouth 2 (two) times daily as needed (abdominal pain). 06/24/19   [provider]  famotidine (PEPCID) 40 MG tablet Take 40 mg by mouth daily. 12/17/22   [provider]  fluvoxaMINE  (LUVOX ) 50 MG tablet Take 50 mg by mouth 2 (two) times daily. Takes along with 25 to total 75 mg 03/20/21   [provider]  hydrOXYzine (ATARAX) 25 MG tablet Take 25 mg by mouth every 8 (eight) hours as needed.    [provider]  Interferon  Beta-1a (AVONEX IM) Inject 30 mcg into the muscle.    [provider]  levocetirizine (XYZAL) 5 MG tablet Take 5 mg by mouth every evening. 12/11/22   [provider]  linaclotide  (LINZESS ) 145 MCG CAPS capsule Take 1 capsule (145 mcg total) by mouth daily before breakfast. 05/08/23   Brigitte Canard, PA-C  metoprolol  succinate (TOPROL -XL) 25 MG 24 hr tablet Take 25 mg by mouth daily. 03/21/21   [provider]  Multiple Vitamins-Minerals (ADEK GUMMIES PLUS ZN PO) Take 1 tablet by mouth daily.    [provider]  ondansetron  (ZOFRAN -ODT) 4 MG disintegrating tablet Take 4 mg by mouth every 8 (eight) hours as needed for vomiting or nausea. 04/10/19  [provider]  OVER THE COUNTER MEDICATION Take 1 capsule by mouth daily. Lypoic acid    [provider]  pantoprazole  (PROTONIX ) 40 MG tablet Take 40 mg by mouth daily.    [provider]  promethazine -dextromethorphan (PROMETHAZINE -DM) 6.25-15 MG/5ML syrup Take 5 mLs by mouth 3 (three) times daily as needed for cough. 09/25/21   Raspet, Erin K, PA-C  testosterone  cypionate (DEPOTESTOSTERONE CYPIONATE) 200 MG/ML injection Inject into the skin every Saturday. Taken 0.69ml 09/29/19   [provider]  vitamin E 1000 UNIT capsule Take 1,000 Units by mouth daily.    [provider]  zinc gluconate 50 MG tablet Take 50 mg by mouth in the morning and at bedtime.    [provider]  zolmitriptan (ZOMIG-ZMT) 2.5 MG disintegrating tablet Take 2.5 mg by mouth daily as needed for migraine. 03/24/19   [provider]    Family History Family History  Problem Relation Age of Onset   Breast cancer Paternal Aunt    Breast cancer Paternal Aunt    Healthy Mother    CVA Father    Hypertension Father    Diabetes Father    Breast cancer Paternal Grandmother     Social History Social History   Tobacco Use   Smoking status: Former    Current packs/day: 0.00    Types:  Cigarettes    Quit date: 06/21/2003    Years since quitting: 20.0   Smokeless tobacco: Never  Vaping Use   Vaping status: Never Used  Substance Use Topics   Alcohol use: No   Drug use: No     Allergies   Penicillins, Remdesivir, Azithromycin , Chlorhexidine gluconate, Clonazepam, Dexamethasone, Doxycycline, Fluoxetine, Nsaids, Hydrocodone-acetaminophen , Tape, and Tuberculin ppd   Review of Systems Review of Systems  Constitutional:  Positive for chills and fever.  HENT:  Positive for congestion and sore throat. Negative for ear pain.   Eyes:  Negative for discharge and redness.  Respiratory:  Positive for cough. Negative for shortness of breath and wheezing.   Gastrointestinal:  Negative for abdominal pain, diarrhea, nausea and vomiting.     Physical Exam Triage Vital Signs ED Triage Vitals  Encounter Vitals Group     BP      Systolic BP Percentile      Diastolic BP Percentile      Pulse      Resp      Temp      Temp src      SpO2      Weight      Height      Head Circumference      Peak Flow      Pain Score      Pain Loc      Pain Education      Exclude from Growth Chart    No data found.  Updated Vital Signs BP 121/76 (BP Location: Left Arm)   Pulse 84   Temp 99.5 F (37.5 C) (Oral)   Resp 16   Ht 5\' 10"  (1.778 m)   Wt 181 lb (82.1 kg)   SpO2 95%   BMI 25.97 kg/m   Visual Acuity Right Eye Distance:   Left Eye Distance:   Bilateral Distance:    Right Eye Near:   Left Eye Near:    Bilateral Near:     Physical Exam Vitals and nursing note reviewed.  Constitutional:      General: Raul is not in acute distress.    Appearance:  Normal appearance. Eliezer is not ill-appearing.  HENT:     Head: Normocephalic and atraumatic.     Nose: Congestion present.     Mouth/Throat:     Mouth: Mucous membranes are moist.     Pharynx: No oropharyngeal exudate or posterior oropharyngeal erythema.     Comments: White coating noted to tongue Eyes:      Conjunctiva/sclera: Conjunctivae normal.  Cardiovascular:     Rate and Rhythm: Normal rate and regular rhythm.     Heart sounds: Normal heart sounds. No murmur heard. Pulmonary:     Effort: Pulmonary effort is normal. No respiratory distress.     Breath sounds: Normal breath sounds. No wheezing, rhonchi or rales.  Skin:    General: Skin is warm and dry.  Neurological:     Mental Status: Elijan is alert.  Psychiatric:        Mood and Affect: Mood normal.        Thought Content: Thought content normal.      UC Treatments / Results  Labs (all labs ordered are listed, but only abnormal results are displayed) Labs Reviewed  POC COVID19/FLU A&B COMBO    EKG   Radiology No results found.  Procedures Procedures (including critical care time)  Medications Ordered in UC Medications - No data to display  Initial Impression / Assessment and Plan / UC Course  I have reviewed the triage vital signs and the nursing notes.  Pertinent labs & imaging results that were available during my care of the patient were reviewed by me and considered in my medical decision making (see chart for details).    COVID and flu screening negative.  Suspect other viral etiology of upper respiratory symptoms.  Recommended symptomatic treatment, increase fluids and rest.  Will treat with nystatin  for suspected thrush given appearance of tongue.  Advise follow-up if no gradual improvement over the next few days or sooner with any worsening.  Patient expresses understanding.  Final Clinical Impressions(s) / UC Diagnoses   Final diagnoses:  Thrush  Viral URI with cough   Discharge Instructions   None    ED Prescriptions     Medication Sig Dispense Auth. Provider   nystatin  (MYCOSTATIN ) 100000 UNIT/ML suspension Use as directed 5 mLs (500,000 Units total) in the mouth or throat 4 (four) times daily for 7 days. 140 mL Vernestine Gondola, PA-C      PDMP not reviewed this encounter.   Vernestine Gondola, PA-C 06/29/23 [phone]

## 2023-06-29 NOTE — ED Triage Notes (Signed)
 Patient here today with c/o cough, nasal congestion, fever, chills, sweats, and body aches since yesterday. Patient's children are also having similar symptoms.

## 2023-07-01 ENCOUNTER — Ambulatory Visit: Admitting: Gastroenterology

## 2023-07-02 ENCOUNTER — Ambulatory Visit

## 2023-07-08 ENCOUNTER — Encounter: Admitting: Physical Therapy

## 2023-07-16 ENCOUNTER — Encounter

## 2023-07-18 ENCOUNTER — Encounter: Admitting: Physical Therapy

## 2023-07-23 ENCOUNTER — Encounter

## 2023-07-25 ENCOUNTER — Encounter: Admitting: Physical Therapy

## 2023-07-28 ENCOUNTER — Encounter

## 2023-07-30 ENCOUNTER — Encounter

## 2023-08-25 ENCOUNTER — Other Ambulatory Visit: Payer: Self-pay

## 2023-08-25 DIAGNOSIS — D509 Iron deficiency anemia, unspecified: Secondary | ICD-10-CM

## 2023-08-26 ENCOUNTER — Inpatient Hospital Stay

## 2023-08-26 ENCOUNTER — Ambulatory Visit: Payer: 59 | Admitting: Internal Medicine

## 2023-08-26 ENCOUNTER — Inpatient Hospital Stay: Admitting: Oncology

## 2023-08-26 ENCOUNTER — Other Ambulatory Visit: Payer: 59

## 2023-08-26 ENCOUNTER — Ambulatory Visit: Payer: 59

## 2023-09-09 ENCOUNTER — Ambulatory Visit (INDEPENDENT_AMBULATORY_CARE_PROVIDER_SITE_OTHER): Admitting: Radiology

## 2023-09-09 ENCOUNTER — Ambulatory Visit
Admission: RE | Admit: 2023-09-09 | Discharge: 2023-09-09 | Disposition: A | Discharge status: Home / Self Care | Source: Ambulatory Visit | Attending: Internal Medicine | Admitting: Internal Medicine

## 2023-09-09 ENCOUNTER — Ambulatory Visit: Payer: Self-pay | Admitting: Internal Medicine

## 2023-09-09 VITALS — BP 136/87 | HR 80 | Temp 98.1°F | Resp 17

## 2023-09-09 DIAGNOSIS — M25531 Pain in right wrist: Secondary | ICD-10-CM | POA: Diagnosis not present

## 2023-09-09 DIAGNOSIS — M79641 Pain in right hand: Secondary | ICD-10-CM
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
## 2023-09-09 NOTE — Discharge Instructions (Signed)
 Your x-rays of your hand/wrist were negative for fracture or dislocation. You likely sprained your hand/wrist.   Wear the wrist/hand brace we provided in the clinic for the next couple of weeks to provide compression, stability, and comfort.  Please rest, ice, and elevate your hand/wrist to help it heal and decrease inflammation.   Take 1,000mg  tylenol  every 6 hours as needed for pain and inflammation. Take with food to avoid stomach upset.  Call the orthopedic provider listed on your discharge paperwork to schedule a follow-up appointment if your symptoms do not improve in the next 1-2 weeks with supportive care.  Return to urgent care if you experience worsening pain, numbness, tingling, change of color in your skin near the injury, or any other concerning symptoms.  I hope you feel better!

## 2023-09-09 NOTE — ED Provider Notes (Signed)
 [facility]    CSN: [id number] Arrival date & time: 09/09/23  0903      History   Chief Complaint Chief Complaint  Patient presents with   Hand Injury    HPI [name] is a 43 y.o. adult.   [name] is a 43 y.o. adult presenting for chief complaint of hand injury. He was helping to restrain his 75 year old daughter during a mental health episode and injured his hand.  He was on the floor holding his daughter from behind to help calm her down and states his daughter was vigorously moving and attempting to punch his hand/forearm.  States everything happened very quickly and he cannot remember exact injury to the hand.  He is now experiencing pain to the anatomical snuffbox of the right hand/thumb and pain to the radial portion of the right wrist.  Pain intermittently radiates to the proximal right forearm with certain movements and feels tight.  States the hand became very swollen after initial injury, swelling has improved.  Denies previous injury to the right hand.  Taking over-the-counter medications for symptoms with some relief.   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   Postop check 07/12/2020   Prediabetes 01/01/2022   Small bowel obstruction (HCC) 03/2019   Sudden-onset sensorineural hearing loss    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    Patient Active Problem List   Diagnosis Date Noted   Restless legs syndrome 05/25/2023   Type 2 diabetes  mellitus without complication, without long-term current use of insulin (HCC) 05/16/2023   IDA (iron  deficiency anemia) 04/28/2023   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   Labor and delivery indication for care or intervention 02/15/2017   Encounter for procreative genetic counseling 08/02/2016   Umbilical hernia without obstruction and without gangrene    ADHD (attention deficit hyperactivity disorder) 06/20/2016   Anxiety 06/20/2016   Asthma without status asthmaticus 06/20/2016   Depression 06/20/2016   Fibrocystic breast disease 06/20/2016   Fibromyalgia 06/20/2016   Migraine 06/20/2016   Ovarian cyst 06/20/2016   Morbid obesity (HCC) 02/24/2014   KNEE PAIN, BILATERAL 01/02/2010   TENDINITIS, PATELLAR 01/02/2010    Past Surgical History:  Procedure Laterality Date   BUNIONECTOMY Bilateral    COLONOSCOPY WITH PROPOFOL  N/A 08/19/2019   Procedure: COLONOSCOPY WITH PROPOFOL ;  Surgeon: Unk Corinn Skiff, MD;  Location: [facility] ENDOSCOPY;  Service: Gastroenterology;  Laterality: N/A;   COLONOSCOPY WITH PROPOFOL  N/A 05/14/2023   Procedure: COLONOSCOPY WITH PROPOFOL ;  Surgeon: Unk Corinn Skiff, MD;  Location: [facility] ENDOSCOPY;  Service: Gastroenterology;  Laterality: N/A;   ESOPHAGOGASTRODUODENOSCOPY (EGD) WITH PROPOFOL  N/A 08/19/2019   Procedure: ESOPHAGOGASTRODUODENOSCOPY (EGD) WITH PROPOFOL ;  Surgeon: Unk Corinn Skiff, MD;  Location: [facility] ENDOSCOPY;  Service: Gastroenterology;  Laterality: N/A;   EYE SURGERY     blood clot in eye  GASTROPLASTY DUODENAL SWITCH  2020   HERNIA REPAIR  1991, 2001   inguinal and umbilical   LAPAROSCOPIC GASTRIC SLEEVE RESECTION  2015   TOTAL MASTECTOMY Bilateral     OB History     Gravida  2   Para      Term      Preterm      AB      Living  0      SAB  0   IAB      Ectopic      Multiple      Live Births               Home Medications     Prior to Admission medications   Medication Sig Start Date End Date Taking? Authorizing Provider  albuterol  (PROVENTIL ) (2.5 MG/3ML) 0.083% nebulizer solution Take 3 mLs (2.5 mg total) by nebulization every 6 (six) hours as needed for wheezing or shortness of breath. 03/11/23   Kennyth Domino, FNP  albuterol  (VENTOLIN  HFA) 108 (90 Base) MCG/ACT inhaler Inhale 1-2 puffs into the lungs every 4 (four) hours as needed for shortness of breath or wheezing.    [provider]  ALPRAZolam  (XANAX ) 0.5 MG tablet Take 0.5 mg by mouth daily as needed for anxiety. 02/09/21   [provider]  amitriptyline  (ELAVIL ) 25 MG tablet Take 25 mg by mouth at bedtime. 04/22/23   [provider]  amphetamine -dextroamphetamine  (ADDERALL) 20 MG tablet Take 20 mg by mouth 3 (three) times daily. 03/07/21   [provider]  B Complex-C (SUPER B COMPLEX/VITAMIN C PO) Take 1 tablet by mouth daily.     [provider]  busPIRone  (BUSPAR ) 15 MG tablet Take 15 mg by mouth 4 (four) times daily. 10/09/20   [provider]  Calcium  Carbonate (CALCIUM  500 PO) Take 500 mg by mouth 4 (four) times daily. chewable    [provider]  CALCIUM  MAGNESIUM  ZINC PO Take by mouth.    [provider]  Cholecalciferol  (VITAMIN D -1000 MAX ST) 25 MCG (1000 UT) tablet Take by mouth daily.    [provider]  clindamycin (CLEOCIN T) 1 % external solution Apply 1 Application topically 2 (two) times daily. 05/05/23   [provider]  CREON  36000-114000 units CPEP capsule Take 72,000 Units by mouth 3 (three) times daily with meals. 03/19/21   [provider]  diclofenac Sodium (VOLTAREN) 1 % GEL Apply topically 4 (four) times daily.    [provider]  dronabinol (MARINOL) 5 MG capsule Take 5 mg by mouth 2 (two) times daily as needed (abdominal pain). 06/24/19   [provider]  famotidine (PEPCID) 40 MG tablet Take 40 mg by mouth daily. 12/17/22    [provider]  fluvoxaMINE  (LUVOX ) 50 MG tablet Take 50 mg by mouth 2 (two) times daily. Takes along with 25 to total 75 mg 03/20/21   [provider]  hydrOXYzine (ATARAX) 25 MG tablet Take 25 mg by mouth every 8 (eight) hours as needed.    [provider]  Interferon Beta-1a (AVONEX IM) Inject 30 mcg into the muscle.    [provider]  levocetirizine (XYZAL) 5 MG tablet Take 5 mg by mouth every evening. 12/11/22   [provider]  linaclotide  (LINZESS ) 145 MCG CAPS capsule Take 1 capsule (145 mcg total) by mouth daily before breakfast. 05/08/23   [place], PA-C  metoprolol  succinate (TOPROL -XL) 25 MG 24 hr tablet Take 25 mg by mouth daily.  03/21/21   [provider]  Multiple Vitamins-Minerals (ADEK GUMMIES PLUS ZN PO) Take 1 tablet by mouth daily.    [provider]  ondansetron  (ZOFRAN -ODT) 4 MG disintegrating tablet Take 4 mg by mouth every 8 (eight) hours as needed for vomiting or nausea. 04/10/19   [provider]  OVER THE COUNTER MEDICATION Take 1 capsule by mouth daily. Lypoic acid    [provider]  pantoprazole  (PROTONIX ) 40 MG tablet Take 40 mg by mouth daily.    [provider]  promethazine -dextromethorphan (PROMETHAZINE -DM) 6.25-15 MG/5ML syrup Take 5 mLs by mouth 3 (three) times daily as needed for cough. 09/25/21   Raspet, Erin K, PA-C  testosterone  cypionate (DEPOTESTOSTERONE CYPIONATE) 200 MG/ML injection Inject into the skin every Saturday. Taken 0.47ml 09/29/19   [provider]  vitamin E 1000 UNIT capsule Take 1,000 Units by mouth daily.    [provider]  zinc gluconate 50 MG tablet Take 50 mg by mouth in the morning and at bedtime.    [provider]  zolmitriptan (ZOMIG-ZMT) 2.5 MG disintegrating tablet Take 2.5 mg by mouth daily as needed for migraine. 03/24/19   [provider]    Family History Family History  Problem Relation Age of Onset    Breast cancer Paternal Aunt    Breast cancer Paternal Aunt    Healthy Mother    CVA Father    Hypertension Father    Diabetes Father    Breast cancer Paternal Grandmother     Social History Social History   Tobacco Use   Smoking status: Former    Current packs/day: 0.00    Types: Cigarettes    Quit date: 06/21/2003    Years since quitting: 20.2   Smokeless tobacco: Never  Vaping Use   Vaping status: Never Used  Substance Use Topics   Alcohol use: No   Drug use: No     Allergies   Penicillins, Remdesivir, Azithromycin , Chlorhexidine gluconate, Clonazepam, Dexamethasone, Doxycycline, Fluoxetine, Nsaids, Hydrocodone-acetaminophen , Tape, and Tuberculin ppd   Review of Systems Review of Systems Per HPI  Physical Exam Triage Vital Signs ED Triage Vitals  Encounter Vitals Group     BP 09/09/23 0917 136/87     Girls Systolic BP Percentile --      Girls Diastolic BP Percentile --      Boys Systolic BP Percentile --      Boys Diastolic BP Percentile --      Pulse Rate 09/09/23 0913 96     Resp 09/09/23 0917 17     Temp 09/09/23 0913 98.1 F (36.7 C)     Temp Source 09/09/23 0913 Oral     SpO2 09/09/23 0913 98 %     Weight --      Height --      Head Circumference --      Peak Flow --      Pain Score --      Pain Loc --      Pain Education --      Exclude from Growth Chart --    No data found.  Updated Vital Signs BP 136/87 (BP Location: Right Arm)   Pulse 80   Temp 98.1 F (36.7 C) (Oral)   Resp 17   SpO2 96%   Visual Acuity Right Eye Distance:   Left Eye Distance:   Bilateral Distance:    Right Eye Near:   Left Eye Near:    Bilateral Near:  Physical Exam Vitals and nursing note reviewed.  Constitutional:      Appearance: [name] is not ill-appearing or toxic-appearing.  HENT:     Head: Normocephalic and atraumatic.     Right Ear: Hearing and external ear normal.     Left Ear: Hearing and external ear normal.     Nose: Nose normal.      Mouth/Throat:     Lips: Pink.   Eyes:     General: Lids are normal. Vision grossly intact. Gaze aligned appropriately.     Extraocular Movements: Extraocular movements intact.     Conjunctiva/sclera: Conjunctivae normal.   Pulmonary:     Effort: Pulmonary effort is normal.   Musculoskeletal:     Right wrist: Tenderness (TTP over the radial aspect of the R wrist) and snuff box tenderness present. No swelling, deformity, effusion, lacerations, bony tenderness or crepitus. Normal range of motion. Normal pulse.     Right hand: Tenderness and bony tenderness present. No swelling, deformity or lacerations. Normal range of motion. Normal strength. Normal sensation. There is no disruption of two-point discrimination. Normal capillary refill. Normal pulse.     Cervical back: Neck supple.     Comments: 5/5 grip strength bilateral hands, sensation intact distally, less than 2 cap refill. +2 R radial pulse.  Full ROM of the right wrist and right hand. Minimal bruising over the radial portion of the dorsal right hand.    Skin:    General: Skin is warm and dry.     Capillary Refill: Capillary refill takes less than 2 seconds.     Findings: No rash.   Neurological:     General: No focal deficit present.     Mental Status: [name] is alert and oriented to person, place, and time. Mental status is at baseline.     Cranial Nerves: No dysarthria or facial asymmetry.   Psychiatric:        Mood and Affect: Mood normal.        Speech: Speech normal.        Behavior: Behavior normal.        Thought Content: Thought content normal.        Judgment: Judgment normal.      UC Treatments / Results  Labs (all labs ordered are listed, but only abnormal results are displayed) Labs Reviewed - No data to display  EKG   Radiology No results found.  Procedures Procedures (including critical care time)  Medications Ordered in UC Medications - No data to display  Initial Impression / Assessment and  Plan / UC Course  I have reviewed the triage vital signs and the nursing notes.  Pertinent labs & imaging results that were available during my care of the patient were reviewed by me and considered in my medical decision making (see chart for details).   1. Right hand pain, right wrist pain Right hand x-ray shows no acute bony abnormality to the right hand/wrist by my interpretation on wet read.  Patient discharged prior to radiology re-read, staff will notify patient if radiology re-read requires change in treatment plan at discussed prior to discharge from office.   Treatment with supportive care initiated. He is not a candidate for NSAIDs due to history of gastric bypass, Tylenol  recommended.  RICE recommended.  Compression and support with right thumb spica brace provided in clinic today.  Recommend follow-up with [facility] should symptoms fail to improve with supportive care.  Counseled patient on potential for adverse effects with medications prescribed/recommended  today, strict ER and return-to-clinic precautions discussed, patient verbalized understanding.    Final Clinical Impressions(s) / UC Diagnoses   Final diagnoses:  Right hand pain  Right wrist pain     Discharge Instructions      Your x-rays of your hand/wrist were negative for fracture or dislocation. You likely sprained your hand/wrist.   Wear the wrist/hand brace we provided in the clinic for the next couple of weeks to provide compression, stability, and comfort.  Please rest, ice, and elevate your hand/wrist to help it heal and decrease inflammation.   Take 1,000mg  tylenol  every 6 hours as needed for pain and inflammation. Take with food to avoid stomach upset.  Call the orthopedic provider listed on your discharge paperwork to schedule a follow-up appointment if your symptoms do not improve in the next 1-2 weeks with supportive care.  Return to urgent care if you experience worsening pain, numbness,  tingling, change of color in your skin near the injury, or any other concerning symptoms.  I hope you feel better!      ED Prescriptions   None    PDMP not reviewed this encounter.   Enedelia Dorna HERO, FNP 09/09/23 1000

## 2023-09-10 ENCOUNTER — Inpatient Hospital Stay

## 2023-09-10 ENCOUNTER — Inpatient Hospital Stay: Admitting: Oncology

## 2023-09-11 ENCOUNTER — Inpatient Hospital Stay: Attending: Oncology

## 2023-09-11 ENCOUNTER — Encounter: Payer: Self-pay | Admitting: Oncology

## 2023-09-11 ENCOUNTER — Inpatient Hospital Stay (HOSPITAL_BASED_OUTPATIENT_CLINIC_OR_DEPARTMENT_OTHER): Admitting: Oncology

## 2023-09-11 ENCOUNTER — Inpatient Hospital Stay

## 2023-09-11 VITALS — BP 127/90 | HR 94 | Temp 97.4°F | Resp 18 | Ht 70.0 in | Wt 180.7 lb

## 2023-09-11 DIAGNOSIS — D509 Iron deficiency anemia, unspecified: Secondary | ICD-10-CM | POA: Insufficient documentation

## 2023-09-11 DIAGNOSIS — Z9884 Bariatric surgery status: Secondary | ICD-10-CM | POA: Insufficient documentation

## 2023-09-11 LAB — CBC WITH DIFFERENTIAL (CANCER CENTER ONLY)
Abs Immature Granulocytes: 0.01 10*3/uL (ref 0.00–0.07)
Basophils Absolute: 0 10*3/uL (ref 0.0–0.1)
Basophils Relative: 1 %
Eosinophils Absolute: 0 10*3/uL (ref 0.0–0.5)
Eosinophils Relative: 1 %
HCT: 45.2 % (ref 39.0–52.0)
Hemoglobin: 15.2 g/dL (ref 13.0–17.0)
Immature Granulocytes: 0 %
Lymphocytes Relative: 42 %
Lymphs Abs: 1.7 10*3/uL (ref 0.7–4.0)
MCH: 29.3 pg (ref 26.0–34.0)
MCHC: 33.6 g/dL (ref 30.0–36.0)
MCV: 87.1 fL (ref 80.0–100.0)
Monocytes Absolute: 0.5 10*3/uL (ref 0.1–1.0)
Monocytes Relative: 13 %
Neutro Abs: 1.7 10*3/uL (ref 1.7–7.7)
Neutrophils Relative %: 43 %
Platelet Count: 264 10*3/uL (ref 150–400)
RBC: 5.19 MIL/uL (ref 4.22–5.81)
RDW: 13.2 % (ref 11.5–15.5)
WBC Count: 4 10*3/uL (ref 4.0–10.5)
nRBC: 0 % (ref 0.0–0.2)

## 2023-09-11 LAB — VITAMIN B12: Vitamin B-12: 656 pg/mL (ref 180–914)

## 2023-09-11 LAB — IRON AND TIBC
Iron: 95 ug/dL (ref 45–182)
Saturation Ratios: 22 % (ref 17.9–39.5)
TIBC: 428 ug/dL (ref 250–450)
UIBC: 333 ug/dL

## 2023-09-11 LAB — FERRITIN: Ferritin: 74 ng/mL (ref 24–336)

## 2023-09-11 LAB — FOLATE: Folate: 12.3 ng/mL (ref >5.9)

## 2023-09-11 NOTE — Progress Notes (Signed)
 09/09/23 hand injury ED.

## 2023-09-12 ENCOUNTER — Encounter: Payer: Self-pay | Admitting: Internal Medicine
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
## 2023-09-30 ENCOUNTER — Other Ambulatory Visit: Payer: Self-pay

## 2023-09-30 ENCOUNTER — Ambulatory Visit
Admission: RE | Admit: 2023-09-30 | Discharge: 2023-09-30 | Disposition: A | Discharge status: Home / Self Care | Attending: Physician Assistant | Admitting: Physician Assistant

## 2023-09-30 ENCOUNTER — Encounter: Payer: Self-pay | Admitting: Internal Medicine

## 2023-09-30 VITALS — BP 134/78 | HR 74 | Temp 98.0°F | Resp 20 | Ht 70.0 in | Wt 180.0 lb

## 2023-09-30 DIAGNOSIS — M436 Torticollis: Secondary | ICD-10-CM | POA: Diagnosis not present

## 2023-09-30 MED ORDER — PREDNISONE 20 MG PO TABS: ORAL_TABLET | ORAL | 0 refills | Status: AC

## 2023-09-30 MED ORDER — METHOCARBAMOL 500 MG PO TABS
500.0000 mg | ORAL_TABLET | Freq: Two times a day (BID) | ORAL | 0 refills | Status: AC
Start: 2023-09-30 — End: ?

## 2023-09-30 NOTE — Discharge Instructions (Addendum)
 You were seen today for concerns of neck pain and stiffness Your symptoms appear to be consistent with a condition called torticollis.  I have included further patient education materials in your after visit summary for you to review when you are able to. I have also included stretches in your paperwork to help with improving your stiffness and pain.  They should not be excruciating.  You can try doing these after applying a warm compress to the area as this can sometimes help with the stiffness. To help with your symptoms I am sending in a prescription for prednisone  and a muscle relaxer called Robaxin .  Please be advised that the Robaxin  can make you sleepy so do not take it if you need to remain alert or drive. I have sent in a script for Prednisone  taper to be taken in the morning with breakfast per the instructions on the container Remember that steroids can cause sleeplessness, irritability, increased hunger and elevated glucose levels so be mindful of these side effects. They should lessen as you progress to the lower doses of the taper. Please make sure that you follow-up with your primary care provider and your MS management team to further assess your symptoms and make sure that your medications are controlling your symptoms as intended.

## 2023-09-30 NOTE — ED Provider Notes (Signed)
 [facility]    CSN: [id number] Arrival date & time: 09/30/23  1115      History   Chief Complaint Chief Complaint  Patient presents with   Neck Pain    HPI [name] is a 43 y.o. adult.   HPI  Pt presents today with concerns for right-sided neck pain  They state that the pain improves with keeping their left ear on the shoulder and some pressure applied to the back of the neck They state they cannot hold their head up and moving their head is very painful. They report that the pain after movement is worse than the actual movement pain   They reports previous hx of MS and are not sure if this is a flare or something else  They deny recent injury or trauma to the area They report some increased muscle weakness along the right side- states they have been dropping things and things have slipped out of their hand They report some bladder weakness and issues with incontinence over the last week. 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   Postop check 07/12/2020   Prediabetes 01/01/2022   Small bowel obstruction (HCC) 03/2019   Sudden-onset sensorineural hearing loss    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    Patient Active Problem List   Diagnosis Date Noted   Restless legs syndrome 05/25/2023    Type 2 diabetes mellitus without complication, without long-term current use of insulin (HCC) 05/16/2023   IDA (iron  deficiency anemia) 04/28/2023   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   Labor and delivery indication for care or intervention 02/15/2017   Encounter for procreative genetic counseling 08/02/2016   Umbilical hernia without obstruction and without gangrene    ADHD (attention deficit hyperactivity disorder) 06/20/2016   Anxiety 06/20/2016   Asthma without status asthmaticus 06/20/2016   Depression 06/20/2016   Fibrocystic breast disease 06/20/2016   Fibromyalgia 06/20/2016   Migraine 06/20/2016   Ovarian cyst 06/20/2016   Morbid obesity (HCC) 02/24/2014   KNEE PAIN, BILATERAL 01/02/2010   TENDINITIS, PATELLAR 01/02/2010    Past Surgical History:  Procedure Laterality Date   BUNIONECTOMY Bilateral    COLONOSCOPY WITH PROPOFOL  N/A 08/19/2019   Procedure: COLONOSCOPY WITH PROPOFOL ;  Surgeon: Unk Corinn Skiff, MD;  Location: [facility] ENDOSCOPY;  Service: Gastroenterology;  Laterality: N/A;   COLONOSCOPY WITH PROPOFOL  N/A 05/14/2023   Procedure: COLONOSCOPY WITH PROPOFOL ;  Surgeon: Unk Corinn Skiff, MD;  Location[facility] ENDOSCOPY;  Service: Gastroenterology;  Laterality: N/A;   ESOPHAGOGASTRODUODENOSCOPY (EGD) WITH PROPOFOL  N/A 08/19/2019   Procedure: ESOPHAGOGASTRODUODENOSCOPY (EGD) WITH PROPOFOL ;  Surgeon: Unk Corinn Skiff, MD;  Location: [facility] ENDOSCOPY;  Service: Gastroenterology;  Laterality: N/A;   EYE SURGERY  blood clot in eye   GASTROPLASTY DUODENAL SWITCH  2020   HERNIA REPAIR  1991, 2001   inguinal and umbilical   LAPAROSCOPIC GASTRIC SLEEVE RESECTION  2015   TOTAL MASTECTOMY Bilateral     OB History     Gravida  2   Para      Term      Preterm      AB      Living  0      SAB  0   IAB      Ectopic      Multiple      Live Births                Home Medications    Prior to Admission medications   Medication Sig Start Date End Date Taking? Authorizing Provider  methocarbamol  (ROBAXIN ) 500 MG tablet Take 1 tablet (500 mg total) by mouth 2 (two) times daily. 09/30/23  Yes Deannah Rossi E, PA-C  predniSONE  (DELTASONE ) 20 MG tablet Take 60mg  PO daily x 2 days, then40mg  PO daily x 2 days, then 20mg  PO daily x 3 days 09/30/23  Yes Richele Strand E, PA-C  albuterol  (PROVENTIL ) (2.5 MG/3ML) 0.083% nebulizer solution Take 3 mLs (2.5 mg total) by nebulization every 6 (six) hours as needed for wheezing or shortness of breath. 03/11/23   Kennyth Domino, FNP  albuterol  (VENTOLIN  HFA) 108 (90 Base) MCG/ACT inhaler Inhale 1-2 puffs into the lungs every 4 (four) hours as needed for shortness of breath or wheezing.    [provider]  ALPRAZolam  (XANAX ) 0.5 MG tablet Take 0.5 mg by mouth daily as needed for anxiety. 02/09/21   [provider]  amitriptyline  (ELAVIL ) 25 MG tablet Take 25 mg by mouth at bedtime. 04/22/23   [provider]  amphetamine -dextroamphetamine  (ADDERALL) 20 MG tablet Take 20 mg by mouth 3 (three) times daily. 03/07/21   [provider]  B Complex-C (SUPER B COMPLEX/VITAMIN C PO) Take 1 tablet by mouth daily.     [provider]  busPIRone  (BUSPAR ) 15 MG tablet Take 15 mg by mouth 4 (four) times daily. 10/09/20   [provider]  Calcium  Carbonate (CALCIUM  500 PO) Take 500 mg by mouth 4 (four) times daily. chewable    [provider]  CALCIUM  MAGNESIUM  ZINC PO Take by mouth.    [provider]  Cholecalciferol  (VITAMIN D -1000 MAX ST) 25 MCG (1000 UT) tablet Take by mouth daily.    [provider]  clindamycin (CLEOCIN T) 1 % external solution Apply 1 Application topically 2 (two) times daily. 05/05/23   [provider]  CREON  36000-114000 units CPEP capsule Take 72,000 Units by mouth 3 (three) times daily with meals. 03/19/21   [provider]   diclofenac Sodium (VOLTAREN) 1 % GEL Apply topically 4 (four) times daily.    [provider]  dronabinol (MARINOL) 5 MG capsule Take 5 mg by mouth 2 (two) times daily as needed (abdominal pain). 06/24/19   [provider]  famotidine (PEPCID) 40 MG tablet Take 40 mg by mouth daily. 12/17/22   [provider]  fluvoxaMINE  (LUVOX ) 50 MG tablet Take 50 mg by mouth 2 (two) times daily. Takes along with 25 to total 75 mg 03/20/21   [provider]  hydrOXYzine (ATARAX) 25 MG tablet Take 25 mg by mouth every 8 (eight) hours as needed.    [provider]  Interferon Beta-1a (AVONEX IM) Inject 30 mcg into the muscle.  [provider]  levocetirizine (XYZAL) 5 MG tablet Take 5 mg by mouth every evening. 12/11/22   [provider]  linaclotide  (LINZESS ) 145 MCG CAPS capsule Take 1 capsule (145 mcg total) by mouth daily before breakfast. 05/08/23   [place], PA-C  metoprolol  succinate (TOPROL -XL) 25 MG 24 hr tablet Take 25 mg by mouth daily. 03/21/21   [provider]  Multiple Vitamins-Minerals (ADEK GUMMIES PLUS ZN PO) Take 1 tablet by mouth daily.    [provider]  ondansetron  (ZOFRAN -ODT) 4 MG disintegrating tablet Take 4 mg by mouth every 8 (eight) hours as needed for vomiting or nausea. 04/10/19   [provider]  OVER THE COUNTER MEDICATION Take 1 capsule by mouth daily. Lypoic acid    [provider]  pantoprazole  (PROTONIX ) 40 MG tablet Take 40 mg by mouth daily.    [provider]  promethazine -dextromethorphan (PROMETHAZINE -DM) 6.25-15 MG/5ML syrup Take 5 mLs by mouth 3 (three) times daily as needed for cough. 09/25/21   Raspet, Vikrant Pryce K, PA-C  testosterone  cypionate (DEPOTESTOSTERONE CYPIONATE) 200 MG/ML injection Inject into the skin every Saturday. Taken 0.57ml 09/29/19   [provider]  vitamin E 1000 UNIT capsule Take 1,000 Units by mouth daily.    [provider]  zinc  gluconate 50 MG tablet Take 50 mg by mouth in the morning and at bedtime.    [provider]  zolmitriptan (ZOMIG-ZMT) 2.5 MG disintegrating tablet Take 2.5 mg by mouth daily as needed for migraine. 03/24/19   [provider]    Family History Family History  Problem Relation Age of Onset   Breast cancer Paternal Aunt    Breast cancer Paternal Aunt    Healthy Mother    CVA Father    Hypertension Father    Diabetes Father    Breast cancer Paternal Grandmother     Social History Social History   Tobacco Use   Smoking status: Former    Current packs/day: 0.00    Types: Cigarettes    Quit date: 06/21/2003    Years since quitting: 20.2   Smokeless tobacco: Never  Vaping Use   Vaping status: Never Used  Substance Use Topics   Alcohol use: No   Drug use: No     Allergies   Penicillins, Remdesivir, Azithromycin , Chlorhexidine gluconate, Clonazepam, Dexamethasone, Doxycycline, Fluoxetine, Nsaids, Hydrocodone-acetaminophen , Tape, and Tuberculin ppd   Review of Systems Review of Systems  Eyes:  Positive for photophobia.  Genitourinary:  Negative for dysuria.  Musculoskeletal:  Positive for neck pain and neck stiffness.     Physical Exam Triage Vital Signs ED Triage Vitals  Encounter Vitals Group     BP 09/30/23 1121 134/78     Girls Systolic BP Percentile --      Girls Diastolic BP Percentile --      Boys Systolic BP Percentile --      Boys Diastolic BP Percentile --      Pulse Rate 09/30/23 1121 74     Resp 09/30/23 1121 20     Temp 09/30/23 1121 98 F (36.7 C)     Temp Source 09/30/23 1121 Oral     SpO2 09/30/23 1121 96 %     Weight 09/30/23 1121 180 lb (81.6 kg)     Height 09/30/23 1121 5' 10 (1.778 m)     Head Circumference --      Peak Flow --      Pain Score 09/30/23 1134 10  Pain Loc --      Pain Education --      Exclude from Growth Chart --    No data found.  Updated Vital Signs BP 134/78 (BP Location: Right Arm)   Pulse 74    Temp 98 F (36.7 C) (Oral)   Resp 20   Ht 5' 10 (1.778 m)   Wt 180 lb (81.6 kg)   SpO2 96%   BMI 25.83 kg/m   Visual Acuity Right Eye Distance:   Left Eye Distance:   Bilateral Distance:    Right Eye Near:   Left Eye Near:    Bilateral Near:     Physical Exam Constitutional:      General: He is awake. He is in acute distress.     Appearance: He is well-developed and well-groomed. He is not ill-appearing or toxic-appearing.     Comments: Pt is laying down in exam room and has spasms in neck from time causing them to cry out in pain    HENT:     Head: Normocephalic and atraumatic.  Eyes:     General: Lids are normal. Gaze aligned appropriately.     Extraocular Movements: Extraocular movements intact.     Conjunctiva/sclera: Conjunctivae normal.     Pupils: Pupils are equal, round, and reactive to light.  Neck:   Pulmonary:     Effort: Pulmonary effort is normal.  Musculoskeletal:     Cervical back: Rigidity and torticollis present. Pain with movement and muscular tenderness present. Decreased range of motion.  Neurological:     Mental Status: He is alert and oriented to person, place, and time.     GCS: GCS eye subscore is 4. GCS verbal subscore is 5. GCS motor subscore is 6.     Cranial Nerves: No dysarthria or facial asymmetry.     Comments: Decreased grip strength in in right hand 3-4/5 compared to 5/5 on the left hand       UC Treatments / Results  Labs (all labs ordered are listed, but only abnormal results are displayed) Labs Reviewed - No data to display  EKG   Radiology No results found.  Procedures Procedures (including critical care time)  Medications Ordered in UC Medications - No data to display  Initial Impression / Assessment and Plan / UC Course  I have reviewed the triage vital signs and the nursing notes.  Pertinent labs & imaging results that were available during my care of the patient were reviewed by me and considered in my  medical decision making (see chart for details).      Final Clinical Impressions(s) / UC Diagnoses   Final diagnoses:  Acute torticollis   Patient presents today with concerns of neck pain and stiffness along the right side.  They report that keeping the neck bent onto the left shoulder seems to provide relief but keeping this position is exerting a lot of effort.  Physical exam is notable for spasms along the right side of the neck and tenderness.  Patient does have a previous history of MS and reports that they have noticed increased neck pain and stiffness, weakness and some bladder incontinence.  At this time symptoms and HPI appear most consistent with likely torticollis.  I cannot rule out potential MS complication or flare.  For now we will treat with prednisone  taper and methocarbamol  for discomfort.  Recommend reaching out to MS treatment team to assist with further management should this indeed be a flare.  Recommend ED  for more severe symptoms or progression.  Follow-up as needed for progressing or persistent symptoms    Discharge Instructions      You were seen today for concerns of neck pain and stiffness Your symptoms appear to be consistent with a condition called torticollis.  I have included further patient education materials in your after visit summary for you to review when you are able to. I have also included stretches in your paperwork to help with improving your stiffness and pain.  They should not be excruciating.  You can try doing these after applying a warm compress to the area as this can sometimes help with the stiffness. To help with your symptoms I am sending in a prescription for prednisone  and a muscle relaxer called Robaxin .  Please be advised that the Robaxin  can make you sleepy so do not take it if you need to remain alert or drive. I have sent in a script for Prednisone  taper to be taken in the morning with breakfast per the instructions on the  container Remember that steroids can cause sleeplessness, irritability, increased hunger and elevated glucose levels so be mindful of these side effects. They should lessen as you progress to the lower doses of the taper. Please make sure that you follow-up with your primary care provider and your MS management team to further assess your symptoms and make sure that your medications are controlling your symptoms as intended.     ED Prescriptions     Medication Sig Dispense Auth. Provider   predniSONE  (DELTASONE ) 20 MG tablet Take 60mg  PO daily x 2 days, then40mg  PO daily x 2 days, then 20mg  PO daily x 3 days 13 tablet Emaad Nanna E, PA-C   methocarbamol  (ROBAXIN ) 500 MG tablet Take 1 tablet (500 mg total) by mouth 2 (two) times daily. 20 tablet Roselind Klus E, PA-C      PDMP not reviewed this encounter.   Marylene Rocky BRAVO, PA-C 09/30/23 1317

## 2023-09-30 NOTE — ED Triage Notes (Signed)
 Pt presents with a chief complaint of right-sided neck pain that began this morning upon wakening. Describes his pain as spasms, rates a 10/10. Tylenol  taken this AM with no pain relief. Pt is lying down in triage. States the pain is radiating into his right back and right shoulder.

## 2023-12-24 ENCOUNTER — Encounter: Payer: Self-pay | Admitting: Internal Medicine

## 2024-01-05 ENCOUNTER — Other Ambulatory Visit: Payer: Self-pay

## 2024-01-05 ENCOUNTER — Emergency Department (HOSPITAL_COMMUNITY): Admission: EM | Admit: 2024-01-05 | Discharge: 2024-01-05 | Discharge status: Against Medical Advice

## 2024-01-05 DIAGNOSIS — R5383 Other fatigue: Secondary | ICD-10-CM | POA: Diagnosis not present

## 2024-01-05 DIAGNOSIS — M7918 Myalgia, other site: Secondary | ICD-10-CM | POA: Insufficient documentation

## 2024-01-05 DIAGNOSIS — R059 Cough, unspecified: Secondary | ICD-10-CM | POA: Diagnosis present

## 2024-01-05 DIAGNOSIS — Z5321 Procedure and treatment not carried out due to patient leaving prior to being seen by health care provider: Secondary | ICD-10-CM | POA: Insufficient documentation

## 2024-01-05 LAB — RESP PANEL BY RT-PCR (RSV, FLU A&B, COVID)  RVPGX2
Influenza A by PCR: NEGATIVE
Influenza B by PCR: NEGATIVE
Resp Syncytial Virus by PCR: NEGATIVE
SARS Coronavirus 2 by RT PCR: NEGATIVE

## 2024-01-05 MED ORDER — ACETAMINOPHEN 500 MG PO TABS
1000.0000 mg | ORAL_TABLET | Freq: Once | ORAL | Status: DC
  Filled 2024-01-05: qty 2

## 2024-01-05 MED ORDER — ONDANSETRON 4 MG PO TBDP
8.0000 mg | ORAL_TABLET | Freq: Once | ORAL | Status: AC
  Administered 2024-01-05: 8 mg via ORAL
  Filled 2024-01-05: qty 2

## 2024-01-05 NOTE — ED Notes (Signed)
Pt called for x-ray, no answer.

## 2024-01-05 NOTE — ED Triage Notes (Signed)
 Pt arrived from home via [facility] c/o pain all over and nausea since Saturday. Pt reports taking tylenol  and zofran . Pt reports feeling like this when he got the flu last time. Pt has MS and is on a medication regimen.   EMS Vitals 124/84 92 HR 98% 18 RR 117 CBG

## 2024-01-05 NOTE — ED Notes (Addendum)
 Pt visitor came to this NT stating they were leaving. This NT advised against leaving. Pt seen leaving ED with visitor.

## 2024-01-05 NOTE — ED Provider Triage Note (Signed)
 Emergency Medicine Provider Triage Evaluation Note  [name] , a 43 y.o. adult  was evaluated in triage.  Pt complains of all over body pain, cough.   Review of Systems  Positive: Pain, cough, fatigue Negative: Back pain, dysuria   Physical Exam  There were no vitals taken for this visit. Gen:   Awake, moaning, yelling Resp:  Normal effort  MSK:   Moves extremities without difficulty    Medical Decision Making  Medically screening exam initiated at 12:27 PM.  Appropriate orders placed.  [name] was informed that the remainder of the evaluation will be completed by another provider, this initial triage assessment does not replace that evaluation, and the importance of remaining in the ED until their evaluation is complete.     Gennaro Duwaine CROME, DO 01/05/24 1227

## 2024-01-06 ENCOUNTER — Encounter: Payer: Self-pay | Admitting: Internal Medicine

## 2024-01-08 ENCOUNTER — Telehealth: Payer: Self-pay

## 2024-01-08 NOTE — Telephone Encounter (Signed)
 Patient is scheduled to have labs checked on Monday at [facility] but had labs drawn yesterday.  Would like for Dr. Melanee to review results available in his chart from yesterday.  He is feeling weak/tired and wants to know if Dr. Melanee thinks he needs to do anything about low iron .

## 2024-01-08 NOTE — Telephone Encounter (Signed)
 Monday labs can be cancelled. He is not anemic and does not have low iron . Please let him know

## 2024-01-09 ENCOUNTER — Encounter: Payer: Self-pay | Admitting: Internal Medicine

## 2024-01-09 NOTE — Telephone Encounter (Signed)
 Patient informed of MD recommendation.  Please cancel lab appt on Monday.

## 2024-01-12 ENCOUNTER — Other Ambulatory Visit

## 2024-01-12 ENCOUNTER — Inpatient Hospital Stay

## 2024-01-15 ENCOUNTER — Inpatient Hospital Stay

## 2024-02-04 ENCOUNTER — Encounter (HOSPITAL_COMMUNITY): Payer: Self-pay

## 2024-02-04 ENCOUNTER — Other Ambulatory Visit: Payer: Self-pay

## 2024-02-04 ENCOUNTER — Emergency Department (HOSPITAL_COMMUNITY)

## 2024-02-04 ENCOUNTER — Emergency Department (HOSPITAL_COMMUNITY)
Admission: EM | Admit: 2024-02-04 | Discharge: 2024-02-04 | Disposition: A | Discharge status: Home / Self Care | Attending: Emergency Medicine | Admitting: Emergency Medicine

## 2024-02-04 DIAGNOSIS — J45909 Unspecified asthma, uncomplicated: Secondary | ICD-10-CM | POA: Diagnosis not present

## 2024-02-04 DIAGNOSIS — K5900 Constipation, unspecified: Secondary | ICD-10-CM | POA: Diagnosis not present

## 2024-02-04 DIAGNOSIS — R1084 Generalized abdominal pain: Secondary | ICD-10-CM

## 2024-02-04 DIAGNOSIS — R109 Unspecified abdominal pain: Secondary | ICD-10-CM | POA: Diagnosis present

## 2024-02-04 LAB — URINALYSIS, ROUTINE W REFLEX MICROSCOPIC
Bilirubin Urine: NEGATIVE
Glucose, UA: NEGATIVE mg/dL
Hgb urine dipstick: NEGATIVE
Ketones, ur: NEGATIVE mg/dL
Nitrite: NEGATIVE
Protein, ur: NEGATIVE mg/dL
Specific Gravity, Urine: 1.013 (ref 1.005–1.030)
pH: 7 (ref 5.0–8.0)

## 2024-02-04 LAB — CBC
HCT: 40.1 % (ref 39.0–52.0)
Hemoglobin: 13.4 g/dL (ref 13.0–17.0)
MCH: 29.8 pg (ref 26.0–34.0)
MCHC: 33.4 g/dL (ref 30.0–36.0)
MCV: 89.3 fL (ref 80.0–100.0)
Platelets: 244 K/uL (ref 150–400)
RBC: 4.49 MIL/uL (ref 4.22–5.81)
RDW: 12.7 % (ref 11.5–15.5)
WBC: 4.5 K/uL (ref 4.0–10.5)
nRBC: 0 % (ref 0.0–0.2)

## 2024-02-04 LAB — HCG, SERUM, QUALITATIVE: Preg, Serum: NEGATIVE

## 2024-02-04 LAB — COMPREHENSIVE METABOLIC PANEL WITH GFR
ALT: 34 U/L (ref 0–44)
AST: 31 U/L (ref 15–41)
Albumin: 3.7 g/dL (ref 3.5–5.0)
Alkaline Phosphatase: 130 U/L — ABNORMAL HIGH (ref 38–126)
Anion gap: 9 (ref 5–15)
BUN: 7 mg/dL (ref 6–20)
CO2: 29 mmol/L (ref 22–32)
Calcium: 8.3 mg/dL — ABNORMAL LOW (ref 8.9–10.3)
Chloride: 106 mmol/L (ref 98–111)
Creatinine, Ser: 0.94 mg/dL (ref 0.61–1.24)
GFR, Estimated: >60 mL/min (ref >60)
Glucose, Bld: 81 mg/dL (ref 70–99)
Potassium: 3.6 mmol/L (ref 3.5–5.1)
Sodium: 145 mmol/L (ref 135–145)
Total Bilirubin: 0.2 mg/dL (ref 0.0–1.2)
Total Protein: 6.2 g/dL — ABNORMAL LOW (ref 6.5–8.1)

## 2024-02-04 LAB — LIPASE, BLOOD: Lipase: 53 U/L — ABNORMAL HIGH (ref 11–51)

## 2024-02-04 MED ORDER — ONDANSETRON HCL 4 MG/2ML IJ SOLN
4.0000 mg | Freq: Once | INTRAMUSCULAR | Status: AC
  Administered 2024-02-04: 4 mg via INTRAVENOUS
  Filled 2024-02-04: qty 2

## 2024-02-04 MED ORDER — MORPHINE SULFATE (PF) 4 MG/ML IV SOLN
4.0000 mg | Freq: Once | INTRAVENOUS | Status: AC
  Administered 2024-02-04: 4 mg via INTRAVENOUS
  Filled 2024-02-04: qty 1

## 2024-02-04 MED ORDER — GLYCERIN (LAXATIVE) 2 G RE SUPP
1.0000 | Freq: Once | RECTAL | Status: AC
  Administered 2024-02-04: 1 via RECTAL
  Filled 2024-02-04: qty 1

## 2024-02-04 MED ORDER — IOHEXOL 300 MG/ML  SOLN
100.0000 mL | Freq: Once | INTRAMUSCULAR | Status: AC | PRN
  Administered 2024-02-04: 100 mL via INTRAVENOUS

## 2024-02-04 MED ORDER — SODIUM CHLORIDE 0.9 % IV BOLUS
1000.0000 mL | Freq: Once | INTRAVENOUS | Status: AC
  Administered 2024-02-04: 1000 mL via INTRAVENOUS

## 2024-02-04 NOTE — ED Provider Notes (Signed)
 [place] EMERGENCY DEPARTMENT AT [facility] Provider Note   CSN: [id number] Arrival date & time: 02/04/24  9970     Patient presents with: Abdominal Pain   [name] is a 43 y.o. adult.   43 year old patient with complaint of left-sided abdominal pain onset today associated with nausea without vomiting.  Constipation, not passing gas.  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  Has had prior SBO's concern for same.  Denies fevers, changes in bladder habits.  No other complaints or concerns.        Prior to Admission medications   Medication Sig Start Date End Date Taking? Authorizing Provider  albuterol  (PROVENTIL ) (2.5 MG/3ML) 0.083% nebulizer solution Take 3 mLs (2.5 mg total) by nebulization every 6 (six) hours as needed for wheezing or shortness of breath. 03/11/23   Kennyth Domino, FNP  albuterol  (VENTOLIN  HFA) 108 (90 Base) MCG/ACT inhaler Inhale 1-2 puffs into the lungs every 4 (four) hours as needed for shortness of breath or wheezing.    [provider]  ALPRAZolam  (XANAX ) 0.5 MG tablet Take 0.5 mg by mouth daily as needed for anxiety. 02/09/21   [provider]  amitriptyline  (ELAVIL ) 25 MG tablet Take 25 mg by mouth at bedtime. 04/22/23   [provider]  amphetamine -dextroamphetamine  (ADDERALL) 20 MG tablet Take 20 mg by mouth 3 (three) times daily. 03/07/21   [provider]  B Complex-C (SUPER B COMPLEX/VITAMIN C PO) Take 1 tablet by mouth daily.     [provider]  busPIRone  (BUSPAR ) 15 MG tablet Take 15 mg by mouth 4 (four) times daily. 10/09/20   [provider]  Calcium  Carbonate (CALCIUM  500 PO) Take 500 mg by mouth 4 (four) times daily. chewable    [provider]  CALCIUM  MAGNESIUM  ZINC PO Take by mouth.    [provider]  Cholecalciferol  (VITAMIN D -1000 MAX ST) 25 MCG (1000 UT) tablet Take by mouth daily.    [provider]  clindamycin  (CLEOCIN T) 1 % external solution Apply 1 Application topically 2 (two) times daily. 05/05/23   [provider]  CREON  36000-114000 units CPEP capsule Take 72,000 Units by mouth 3 (three) times daily with meals. 03/19/21   [provider]  diclofenac Sodium (VOLTAREN) 1 % GEL Apply topically 4 (four) times daily.    [provider]  dronabinol (MARINOL) 5 MG capsule Take 5 mg by mouth 2 (two) times daily as needed (abdominal pain). 06/24/19   [provider]  famotidine (PEPCID) 40 MG tablet Take 40 mg by mouth daily. 12/17/22   [provider]  fluvoxaMINE  (LUVOX ) 50 MG tablet Take 50 mg by mouth 2 (two) times daily. Takes along with 25 to total 75 mg 03/20/21   [provider]  hydrOXYzine (ATARAX) 25 MG tablet Take 25 mg by mouth every 8 (eight) hours as needed.    [provider]  Interferon Beta-1a (AVONEX IM) Inject 30 mcg into the muscle.    [provider]  levocetirizine (XYZAL) 5 MG tablet Take 5 mg by mouth every evening. 12/11/22   [provider]  linaclotide  (LINZESS ) 145 MCG CAPS capsule Take 1 capsule (145 mcg total) by mouth daily before breakfast. 05/08/23   [place], PA-C  methocarbamol  (ROBAXIN ) 500 MG tablet Take 1 tablet (500 mg total) by mouth 2 (two) times daily. 09/30/23   Mecum, Erin E, PA-C  metoprolol  succinate (TOPROL -XL) 25 MG 24 hr tablet Take 25 mg  by mouth daily. 03/21/21   [provider]  Multiple Vitamins-Minerals (ADEK GUMMIES PLUS ZN PO) Take 1 tablet by mouth daily.    [provider]  ondansetron  (ZOFRAN -ODT) 4 MG disintegrating tablet Take 4 mg by mouth every 8 (eight) hours as needed for vomiting or nausea. 04/10/19   [provider]  OVER THE COUNTER MEDICATION Take 1 capsule by mouth daily. Lypoic acid    [provider]  pantoprazole  (PROTONIX ) 40 MG tablet Take 40 mg by mouth daily.    [provider]  predniSONE  (DELTASONE ) 20 MG tablet  Take 60mg  PO daily x 2 days, then40mg  PO daily x 2 days, then 20mg  PO daily x 3 days 09/30/23   Mecum, Erin E, PA-C  promethazine -dextromethorphan (PROMETHAZINE -DM) 6.25-15 MG/5ML syrup Take 5 mLs by mouth 3 (three) times daily as needed for cough. 09/25/21   Raspet, Erin K, PA-C  testosterone  cypionate (DEPOTESTOSTERONE CYPIONATE) 200 MG/ML injection Inject into the skin every Saturday. Taken 0.44ml 09/29/19   [provider]  vitamin E 1000 UNIT capsule Take 1,000 Units by mouth daily.    [provider]  zinc gluconate 50 MG tablet Take 50 mg by mouth in the morning and at bedtime.    [provider]  zolmitriptan (ZOMIG-ZMT) 2.5 MG disintegrating tablet Take 2.5 mg by mouth daily as needed for migraine. 03/24/19   [provider]    Allergies: Penicillins, Remdesivir, Azithromycin , Chlorhexidine gluconate, Clonazepam, Dexamethasone, Doxycycline, Fluoxetine, Nsaids, Hydrocodone-acetaminophen , Tape, and Tuberculin ppd    Review of Systems Negative except as per the Updated Vital Signs BP 110/80 (BP Location: Right Arm)   Pulse 85   Temp 98.1 F (36.7 C) (Oral)   Resp 18   Ht 5' 10 (1.778 m)   Wt 77.1 kg   SpO2 100%   BMI 24.39 kg/m   Physical Exam Vitals and nursing note reviewed.  Constitutional:      General: He is not in acute distress.    Appearance: He is well-developed. He is not diaphoretic.  HENT:     Head: Normocephalic and atraumatic.  Pulmonary:     Effort: Pulmonary effort is normal.  Abdominal:     General: Bowel sounds are normal.     Palpations: Abdomen is soft.     Tenderness: There is generalized abdominal tenderness. There is no guarding or rebound.  Skin:    General: Skin is warm and dry.     Findings: No erythema or rash.  Neurological:     Mental Status: He is alert and oriented to person, place, and time.  Psychiatric:        Behavior: Behavior normal.     (all labs ordered are listed, but only abnormal results are  displayed) Labs Reviewed  LIPASE, BLOOD - Abnormal; Notable for the following components:      Result Value   Lipase 53 (*)    All other components within normal limits  COMPREHENSIVE METABOLIC PANEL WITH GFR - Abnormal; Notable for the following components:   Calcium  8.3 (*)    Total Protein 6.2 (*)    Alkaline Phosphatase 130 (*)    All other components within normal limits  URINALYSIS, ROUTINE W REFLEX MICROSCOPIC - Abnormal; Notable for the following components:   Leukocytes,Ua SMALL (*)    Bacteria, UA RARE (*)    All other components within normal limits  CBC  HCG, SERUM, QUALITATIVE    EKG: None  Radiology: CT ABDOMEN PELVIS W CONTRAST Result Date:  02/04/2024 EXAM: CT ABDOMEN AND PELVIS WITH CONTRAST 02/04/2024 03:30:20 AM TECHNIQUE: CT of the abdomen and pelvis was performed with the administration of 100 mL of iohexol  (OMNIPAQUE ) 300 MG/ML solution. Multiplanar reformatted images are provided for review. Automated exposure control, iterative reconstruction, and/or weight-based adjustment of the mA/kV was utilized to reduce the radiation dose to as low as reasonably achievable. COMPARISON: 10/22/2019. CLINICAL HISTORY: Abdominal pain, acute, nonlocalized. FINDINGS: LOWER CHEST: No acute abnormality. LIVER: The liver is unremarkable. GALLBLADDER AND BILE DUCTS: Gallbladder is unremarkable. No biliary ductal dilatation. SPLEEN: No acute abnormality. PANCREAS: No acute abnormality. ADRENAL GLANDS: No acute abnormality. KIDNEYS, URETERS AND BLADDER: No stones in the kidneys or ureters. No hydronephrosis. No perinephric or periureteral stranding. Urinary bladder is unremarkable. GI AND BOWEL: Postoperative changes in the stomach. Moderate stool burden throughout the colon. There is no bowel obstruction. PERITONEUM AND RETROPERITONEUM: No ascites. No free air. VASCULATURE: Aorta is normal in caliber. LYMPH NODES: No lymphadenopathy. REPRODUCTIVE ORGANS: IUD in the uterus. No adnexal mass.  BONES AND SOFT TISSUES: No acute osseous abnormality. No focal soft tissue abnormality. IMPRESSION: 1. No acute findings in the abdomen or pelvis. Electronically signed by: Franky Crease MD 02/04/2024 03:34 AM EST RP Workstation: [id number]     Procedures   Medications Ordered in the ED  ondansetron  (ZOFRAN ) injection 4 mg (4 mg Intravenous Given 02/04/24 0209)  sodium chloride  0.9 % bolus 1,000 mL (0 mLs Intravenous Stopped 02/04/24 0411)  morphine  (PF) 4 MG/ML injection 4 mg (4 mg Intravenous Given 02/04/24 0209)  iohexol  (OMNIPAQUE ) 300 MG/ML solution 100 mL (100 mLs Intravenous Contrast Given 02/04/24 0315)  morphine  (PF) 4 MG/ML injection 4 mg (4 mg Intravenous Given 02/04/24 0327)  Glycerin  (Adult) 2 g suppository 1 suppository (1 suppository Rectal Given 02/04/24 0410)                                    Medical Decision Making Amount and/or Complexity of Data Reviewed Labs: ordered. Radiology: ordered.  Risk OTC drugs. Prescription drug management.   This patient presents to the ED for concern of abdominal pain, this involves an extensive number of treatment options, and is a complaint that carries with it a high risk of complications and morbidity.  The differential diagnosis includes but not limited to constipation, SBO, appendicitis, colitis, cholecystitis    Co morbidities / Chronic conditions that complicate the patient evaluation  MS, long covid, SBO, multiple abdominal surgeries, asthma, constipation    Additional history obtained:  Additional history obtained from EMR External records from outside source obtained and reviewed including prior labs on file   Lab Tests:  I Ordered, and personally interpreted labs.  The pertinent results include: hCG negative.  Urinalysis with small leukocytes, rare bacteria.  Lipase  not significantly elevated.  CMP without significant findings.  CBC within.   Imaging Studies ordered:  I ordered imaging studies including CT  abdomen pelvis I independently visualized and interpreted imaging which showed constipation I agree with the radiologist interpretation  Problem List / ED Course / Critical interventions / Medication management  43 year old patient presents with complaint of abdominal pain, nausea, constipation.  Found to have generalized tenderness on exam without guarding or rebound.  Labs reassuring.  CT shows constipation.  Discussed results with patient and significant other at bedside.  Offered glycerin  suppository here, patient may choose to take suppository when they get home.  Plans to manage at  home with senna, MiraLAX, increase water intake and increase fiber.  Follow-up with care team. I ordered medication including morphine , Zofran , glycerin  suppository Reevaluation of the patient after these medicines showed that the patient pain improved I have reviewed the patients home medicines and have made adjustments as needed   Social Determinants of Health:  Has specialty care team   Test / Admission - Considered:  Stable for discharge      Final diagnoses:  Generalized abdominal pain  Constipation, unspecified constipation type    ED Discharge Orders     None          Beverley Leita LABOR, PA-C 02/04/24 0414    Raford Lenis, MD 02/04/24 [phone]

## 2024-02-04 NOTE — ED Triage Notes (Signed)
 Patietn brought in by EMS from home with c/o intermittent left sided abdominal pain that started today.

## 2024-03-10 ENCOUNTER — Encounter: Payer: Self-pay | Admitting: Internal Medicine

## 2024-05-11 ENCOUNTER — Ambulatory Visit: Admitting: Oncology

## 2024-05-11 ENCOUNTER — Other Ambulatory Visit
